# Patient Record
Sex: Female | Born: 1945 | Race: White | Hispanic: No | Smoking: Never smoker
Health system: Southern US, Community
[De-identification: ages and names within clinical notes are randomized; demographics above are authoritative.]

## PROBLEM LIST (undated history)

## (undated) DIAGNOSIS — G43909 Migraine, unspecified, not intractable, without status migrainosus: Secondary | ICD-10-CM

## (undated) DIAGNOSIS — N951 Menopausal and female climacteric states: Secondary | ICD-10-CM

## (undated) DIAGNOSIS — F0150 Vascular dementia without behavioral disturbance: Secondary | ICD-10-CM

## (undated) DIAGNOSIS — G8929 Other chronic pain: Secondary | ICD-10-CM

## (undated) DIAGNOSIS — R519 Headache, unspecified: Secondary | ICD-10-CM

## (undated) DIAGNOSIS — J329 Chronic sinusitis, unspecified: Secondary | ICD-10-CM

## (undated) HISTORY — DX: Migraine, unspecified, not intractable, without status migrainosus: G43.909

## (undated) HISTORY — DX: Menopausal and female climacteric states: N95.1

## (undated) HISTORY — DX: Chronic sinusitis, unspecified: J32.9

## (undated) HISTORY — DX: Other chronic pain: G89.29

## (undated) HISTORY — DX: Headache, unspecified: R51.9

## (undated) HISTORY — PX: DILATION AND CURETTAGE OF UTERUS: SHX78

---
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
## 1998-10-25 ENCOUNTER — Other Ambulatory Visit: Admission: RE | Admit: 1998-10-25 | Discharge: 1998-10-25 | Payer: Self-pay | Admitting: Obstetrics & Gynecology

## 2000-03-16 ENCOUNTER — Other Ambulatory Visit: Admission: RE | Admit: 2000-03-16 | Discharge: 2000-03-16 | Payer: Self-pay | Admitting: Obstetrics and Gynecology

## 2001-05-13 ENCOUNTER — Other Ambulatory Visit: Admission: RE | Admit: 2001-05-13 | Discharge: 2001-05-13 | Payer: Self-pay | Admitting: Obstetrics and Gynecology

## 2002-06-30 ENCOUNTER — Other Ambulatory Visit: Admission: RE | Admit: 2002-06-30 | Discharge: 2002-06-30 | Payer: Self-pay | Admitting: Obstetrics and Gynecology

## 2003-07-19 ENCOUNTER — Other Ambulatory Visit: Admission: RE | Admit: 2003-07-19 | Discharge: 2003-07-19 | Payer: Self-pay | Admitting: Obstetrics and Gynecology

## 2020-06-18 ENCOUNTER — Other Ambulatory Visit: Payer: Self-pay | Admitting: Family Medicine

## 2020-06-18 DIAGNOSIS — R229 Localized swelling, mass and lump, unspecified: Secondary | ICD-10-CM

## 2020-06-18 DIAGNOSIS — G43909 Migraine, unspecified, not intractable, without status migrainosus: Secondary | ICD-10-CM

## 2020-06-18 DIAGNOSIS — R519 Headache, unspecified: Secondary | ICD-10-CM

## 2020-06-21 ENCOUNTER — Ambulatory Visit
Admission: RE | Admit: 2020-06-21 | Discharge: 2020-06-21 | Disposition: A | Discharge status: Home / Self Care | Payer: Medicare Other | Source: Ambulatory Visit | Attending: Family Medicine | Admitting: Family Medicine

## 2020-06-21 DIAGNOSIS — R519 Headache, unspecified: Secondary | ICD-10-CM

## 2020-06-21 DIAGNOSIS — R229 Localized swelling, mass and lump, unspecified: Secondary | ICD-10-CM

## 2020-06-21 DIAGNOSIS — G43909 Migraine, unspecified, not intractable, without status migrainosus: Secondary | ICD-10-CM

## 2020-07-18 DIAGNOSIS — J321 Chronic frontal sinusitis: Secondary | ICD-10-CM | POA: Insufficient documentation

## 2020-07-18 DIAGNOSIS — J320 Chronic maxillary sinusitis: Secondary | ICD-10-CM | POA: Insufficient documentation

## 2020-07-22 ENCOUNTER — Emergency Department (HOSPITAL_COMMUNITY): Payer: Medicare Other

## 2020-07-22 ENCOUNTER — Encounter (HOSPITAL_COMMUNITY): Payer: Self-pay

## 2020-07-22 ENCOUNTER — Emergency Department (HOSPITAL_COMMUNITY)
Admission: EM | Admit: 2020-07-22 | Discharge: 2020-07-22 | Disposition: A | Discharge status: Home / Self Care | Payer: Medicare Other | Attending: Emergency Medicine | Admitting: Emergency Medicine

## 2020-07-22 DIAGNOSIS — Y9301 Activity, walking, marching and hiking: Secondary | ICD-10-CM | POA: Diagnosis not present

## 2020-07-22 DIAGNOSIS — R000 Tachycardia, unspecified: Secondary | ICD-10-CM | POA: Insufficient documentation

## 2020-07-22 DIAGNOSIS — R531 Weakness: Secondary | ICD-10-CM | POA: Diagnosis not present

## 2020-07-22 DIAGNOSIS — N3001 Acute cystitis with hematuria: Secondary | ICD-10-CM | POA: Diagnosis not present

## 2020-07-22 DIAGNOSIS — S42035A Nondisplaced fracture of lateral end of left clavicle, initial encounter for closed fracture: Secondary | ICD-10-CM

## 2020-07-22 DIAGNOSIS — S0990XA Unspecified injury of head, initial encounter: Secondary | ICD-10-CM | POA: Insufficient documentation

## 2020-07-22 DIAGNOSIS — Z20822 Contact with and (suspected) exposure to covid-19: Secondary | ICD-10-CM | POA: Insufficient documentation

## 2020-07-22 DIAGNOSIS — W19XXXA Unspecified fall, initial encounter: Secondary | ICD-10-CM

## 2020-07-22 DIAGNOSIS — B9689 Other specified bacterial agents as the cause of diseases classified elsewhere: Secondary | ICD-10-CM | POA: Diagnosis not present

## 2020-07-22 DIAGNOSIS — W108XXA Fall (on) (from) other stairs and steps, initial encounter: Secondary | ICD-10-CM | POA: Insufficient documentation

## 2020-07-22 LAB — URINALYSIS, ROUTINE W REFLEX MICROSCOPIC
Bilirubin Urine: NEGATIVE
Glucose, UA: NEGATIVE mg/dL
Ketones, ur: NEGATIVE mg/dL
Nitrite: POSITIVE — AB
Protein, ur: 30 mg/dL — AB
Specific Gravity, Urine: 1.021 (ref 1.005–1.030)
WBC, UA: >50 WBC/hpf — ABNORMAL HIGH (ref 0–5)
pH: 5 (ref 5.0–8.0)

## 2020-07-22 LAB — COMPREHENSIVE METABOLIC PANEL
ALT: 24 U/L (ref 0–44)
AST: 24 U/L (ref 15–41)
Albumin: 2.5 g/dL — ABNORMAL LOW (ref 3.5–5.0)
Alkaline Phosphatase: 141 U/L — ABNORMAL HIGH (ref 38–126)
Anion gap: 11 (ref 5–15)
BUN: 6 mg/dL — ABNORMAL LOW (ref 8–23)
CO2: 24 mmol/L (ref 22–32)
Calcium: 9.1 mg/dL (ref 8.9–10.3)
Chloride: 99 mmol/L (ref 98–111)
Creatinine, Ser: 0.71 mg/dL (ref 0.44–1.00)
GFR, Estimated: >60 mL/min (ref >60)
Glucose, Bld: 119 mg/dL — ABNORMAL HIGH (ref 70–99)
Potassium: 3.7 mmol/L (ref 3.5–5.1)
Sodium: 134 mmol/L — ABNORMAL LOW (ref 135–145)
Total Bilirubin: 0.8 mg/dL (ref 0.3–1.2)
Total Protein: 7.5 g/dL (ref 6.5–8.1)

## 2020-07-22 LAB — CBC WITH DIFFERENTIAL/PLATELET
Abs Immature Granulocytes: 0.07 10*3/uL (ref 0.00–0.07)
Basophils Absolute: 0.1 10*3/uL (ref 0.0–0.1)
Basophils Relative: 1 %
Eosinophils Absolute: 0.1 10*3/uL (ref 0.0–0.5)
Eosinophils Relative: 1 %
HCT: 33 % — ABNORMAL LOW (ref 36.0–46.0)
Hemoglobin: 10.2 g/dL — ABNORMAL LOW (ref 12.0–15.0)
Immature Granulocytes: 1 %
Lymphocytes Relative: 12 %
Lymphs Abs: 1.4 10*3/uL (ref 0.7–4.0)
MCH: 28.4 pg (ref 26.0–34.0)
MCHC: 30.9 g/dL (ref 30.0–36.0)
MCV: 91.9 fL (ref 80.0–100.0)
Monocytes Absolute: 0.9 10*3/uL (ref 0.1–1.0)
Monocytes Relative: 8 %
Neutro Abs: 8.5 10*3/uL — ABNORMAL HIGH (ref 1.7–7.7)
Neutrophils Relative %: 77 %
Platelets: 620 10*3/uL — ABNORMAL HIGH (ref 150–400)
RBC: 3.59 MIL/uL — ABNORMAL LOW (ref 3.87–5.11)
RDW: 13.8 % (ref 11.5–15.5)
WBC: 11 10*3/uL — ABNORMAL HIGH (ref 4.0–10.5)
nRBC: 0 % (ref 0.0–0.2)

## 2020-07-22 LAB — RESP PANEL BY RT PCR (RSV, FLU A&B, COVID)
Influenza A by PCR: NEGATIVE
Influenza B by PCR: NEGATIVE
Respiratory Syncytial Virus by PCR: NEGATIVE
SARS Coronavirus 2 by RT PCR: NEGATIVE

## 2020-07-22 LAB — LACTIC ACID, PLASMA: Lactic Acid, Venous: 1.5 mmol/L (ref 0.5–1.9)

## 2020-07-22 MED ORDER — CEPHALEXIN 500 MG PO CAPS: 500.0000 mg | ORAL_CAPSULE | Freq: Four times a day (QID) | ORAL | 0 refills | Status: AC

## 2020-07-22 MED ORDER — CEPHALEXIN 250 MG PO CAPS
500.0000 mg | ORAL_CAPSULE | Freq: Once | ORAL | Status: AC
  Administered 2020-07-22: 500 mg via ORAL
  Filled 2020-07-22: qty 2

## 2020-07-22 MED ORDER — ACETAMINOPHEN 500 MG PO TABS
1000.0000 mg | ORAL_TABLET | Freq: Once | ORAL | Status: AC
  Administered 2020-07-22: 1000 mg via ORAL
  Filled 2020-07-22: qty 2

## 2020-07-22 MED ORDER — SODIUM CHLORIDE 0.9 % IV SOLN: 1.0000 g | Freq: Once | INTRAVENOUS | Status: DC

## 2020-07-22 NOTE — ED Notes (Signed)
Patient placed [address] from lobby. Patient is ambulatory w/ steady gait and stated her sister is outside and she wants to leave. Informed patient needs some evaluation due to fall and re-directed to a chair. Nourishment provided.

## 2020-07-22 NOTE — ED Triage Notes (Signed)
Pt sent from UC for further evaluation of mechanical fall that occurred 3 days ago, pt fells down a few stairs onto her left side, denies hitting her head or LOC. Xray showed + displaced left clavicle fracture. Pt also reports bilateral leg weakness and +UTI at UC. Pt a.o, nad noted

## 2020-07-22 NOTE — ED Provider Notes (Signed)
[facility] EMERGENCY DEPARTMENT Provider Note   CSN: [id number] Arrival date & time: 07/22/20  1330     History Chief Complaint  Patient presents with  . Fall    Colleen Parker is a 74 y.o. female.  HPI   74 y/o female presents to the ED today for eval after a fall. Pt states she fell 1.5 weeks ago while walking up the stairs. She fell forward hitting her head on the stairs. She denies LOC, headaches, vomiting, dizziness/lightheadedness, numbness/weakness or other associated neuro complaints. She denies being on blood thinners. Denies any other injuries from the fall.   She has paperwork with her from urgent care where she was seen PTA. Paper indicates that patient was tachycardic, hypoxic at urgent care. She had a left clavicle fx with possible PNA in the LLL as well as a uti. She reportedly told urgent care she had a 1 week history of dysuria and leg weakness but she denies this to me. Denies fevers, chills, chest pain, shortness of breath, cough, abd pain, NVD, dysuria, frequency, urgency or hematuria.   Sister is at bedside and states that the patient has had urinary frequency for the last several nights and is also had fevers.  She states the patient has been having increased episodes of confusion over the course of the last few months.  History reviewed. No pertinent past medical history.  There are no problems to display for this patient.   History reviewed. No pertinent surgical history.   OB History   No obstetric history on file.     No family history on file.  Social History   Tobacco Use  . Smoking status: Not on file  Substance Use Topics  . Alcohol use: Not on file  . Drug use: Not on file    Home Medications Prior to Admission medications   Medication Sig Start Date End Date Taking? Authorizing Provider  cephALEXin (KEFLEX) 500 MG capsule Take 1 capsule (500 mg total) by mouth 4 (four) times daily for 14 days. 07/22/20 08/05/20   Quintavius Niebuhr S, PA-C    Allergies    Patient has no allergy information on record.  Review of Systems   Review of Systems  Constitutional: Positive for fever.  HENT: Negative for ear pain and sore throat.   Eyes: Negative for visual disturbance.  Respiratory: Negative for cough and shortness of breath.   Cardiovascular: Negative for chest pain.  Gastrointestinal: Negative for abdominal pain, constipation, diarrhea, nausea and vomiting.  Genitourinary: Positive for frequency. Negative for dysuria and hematuria.  Musculoskeletal: Negative for back pain.  Skin: Negative for color change, pallor and rash.  Neurological:       Head injury, no LOC  All other systems reviewed and are negative.   Physical Exam Updated Vital Signs BP 113/73   Pulse (!) 107   Temp (!) 100.5 F (38.1 C) (Oral)   Resp 16   SpO2 94%   Physical Exam Vitals and nursing note reviewed.  Constitutional:      General: She is not in acute distress.    Appearance: She is well-developed.  HENT:     Head: Normocephalic.     Comments: Old ecchymosis to the left side of the face/head Eyes:     Conjunctiva/sclera: Conjunctivae normal.  Cardiovascular:     Rate and Rhythm: Regular rhythm. Tachycardia present.     Heart sounds: Normal heart sounds. No murmur heard.   Pulmonary:     Effort:  Pulmonary effort is normal.     Breath sounds: Rales present.     Comments: Ecchymosis to the left upper chest Abdominal:     General: Bowel sounds are normal.     Palpations: Abdomen is soft.     Tenderness: There is no abdominal tenderness. There is no guarding or rebound.  Musculoskeletal:     Cervical back: Neck supple.  Skin:    General: Skin is warm and dry.  Neurological:     Mental Status: She is alert.     ED Results / Procedures / Treatments   Labs (all labs ordered are listed, but only abnormal results are displayed) Labs Reviewed  CBC WITH DIFFERENTIAL/PLATELET - Abnormal; Notable for the  following components:      Result Value   WBC 11.0 (*)    RBC 3.59 (*)    Hemoglobin 10.2 (*)    HCT 33.0 (*)    Platelets 620 (*)    Neutro Abs 8.5 (*)    All other components within normal limits  COMPREHENSIVE METABOLIC PANEL - Abnormal; Notable for the following components:   Sodium 134 (*)    Glucose, Bld 119 (*)    BUN 6 (*)    Albumin 2.5 (*)    Alkaline Phosphatase 141 (*)    All other components within normal limits  URINALYSIS, ROUTINE W REFLEX MICROSCOPIC - Abnormal; Notable for the following components:   Color, Urine AMBER (*)    APPearance CLOUDY (*)    Hgb urine dipstick SMALL (*)    Protein, ur 30 (*)    Nitrite POSITIVE (*)    Leukocytes,Ua MODERATE (*)    WBC, UA >50 (*)    Bacteria, UA MANY (*)    All other components within normal limits  RESP PANEL BY RT PCR (RSV, FLU A&B, COVID)  URINE CULTURE  LACTIC ACID, PLASMA    EKG None  Radiology DG Chest 2 View  Result Date: 07/22/2020 CLINICAL DATA:  Status post fall 1 and half weeks ago. Left clavicular pain and bruising. shortness of breath and dizziness with standing for a period of time. EXAM: LEFT CLAVICLE - 2+ VIEWS; CHEST - 2 VIEW COMPARISON:  None. FINDINGS: Left clavicle: Poorly visualized acute, possibly comminuted, fracture of the distal left clavicle. No acromioclavicular or coracoclavicular joint space widening. Associated subcutaneus soft tissue edema. Chest x-ray: The heart size and mediastinal contours are within normal limits. Thoracic aorta atherosclerotic plaque. No focal consolidation. No pulmonary edema. No pleural effusion. No pneumothorax. Multilevel degenerative changes of the spine. IMPRESSION: Acute, possibly comminuted, fracture of the distal left clavicle. No acute cardiopulmonary abnormality. Electronically Signed   By: Tish Frederickson M.D.   On: 07/22/2020 16:42   DG Clavicle Left  Result Date: 07/22/2020 CLINICAL DATA:  Status post fall 1 and half weeks ago. Left clavicular pain  and bruising. shortness of breath and dizziness with standing for a period of time. EXAM: LEFT CLAVICLE - 2+ VIEWS; CHEST - 2 VIEW COMPARISON:  None. FINDINGS: Left clavicle: Poorly visualized acute, possibly comminuted, fracture of the distal left clavicle. No acromioclavicular or coracoclavicular joint space widening. Associated subcutaneus soft tissue edema. Chest x-ray: The heart size and mediastinal contours are within normal limits. Thoracic aorta atherosclerotic plaque. No focal consolidation. No pulmonary edema. No pleural effusion. No pneumothorax. Multilevel degenerative changes of the spine. IMPRESSION: Acute, possibly comminuted, fracture of the distal left clavicle. No acute cardiopulmonary abnormality. Electronically Signed   By: Normajean Glasgow.D.  On: 07/22/2020 16:42   CT Head Wo Contrast  Result Date: 07/22/2020 CLINICAL DATA:  Larey SeatFell, head trauma EXAM: CT HEAD WITHOUT CONTRAST TECHNIQUE: Contiguous axial images were obtained from the base of the skull through the vertex without intravenous contrast. COMPARISON:  06/21/2020 FINDINGS: Brain: No acute infarct or hemorrhage. Chronic hypodensities in the periventricular white matter consistent with chronic small vessel ischemic change. Lateral ventricles and remaining midline structures are unremarkable. No acute extra-axial fluid collections. No mass effect. Vascular: No hyperdense vessel or unexpected calcification. Skull: Normal. Negative for fracture or focal lesion. Sinuses/Orbits: Chronic appearing sinus disease involving the left frontal and left maxillary sinus, stable. Other: None. IMPRESSION: 1. Stable exam, no acute intracranial process. 2. 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 Electronically Signed   By: Sharlet SalinaMichael  Brown M.D.   On: 07/22/2020 17:55   CT Maxillofacial Wo Contrast  Result Date: 07/22/2020 CLINICAL DATA:  Recent fall with facial pain, initial encounter EXAM: CT MAXILLOFACIAL WITHOUT CONTRAST  TECHNIQUE: Multidetector CT imaging of the maxillofacial structures was performed. Multiplanar CT image reconstructions were also generated. COMPARISON:  06/21/2020 FINDINGS: Osseous: No acute fracture is identified. Mild degenerative changes of the temporomandibular joints are seen. Orbits: Orbits and their contents appear within normal limits. Sinuses: Paranasal sinuses are well aerated. Mucosal thickening is noted within the left frontal, ethmoid sinus is and left maxillary sinus. This appearance has improved in the interval from the prior exam. Soft tissues: Surrounding soft tissue structures show no acute focal hematoma. No significant soft tissue swelling is noted. Limited intracranial: No significant or unexpected finding. IMPRESSION: No acute fracture is noted. Persistent but improved mucosal thickening within the paranasal sinuses as described. Electronically Signed   By: Alcide CleverMark  Lukens M.D.   On: 07/22/2020 17:56    Procedures Procedures (including critical care time)  Medications Ordered in ED Medications  cephALEXin (KEFLEX) capsule 500 mg (has no administration in time range)  acetaminophen (TYLENOL) tablet 1,000 mg (1,000 mg Oral Given 07/22/20 1800)    ED Course  I have reviewed the triage vital signs and the nursing notes.  Pertinent labs & imaging results that were available during my care of the patient were reviewed by me and considered in my medical decision making (see chart for details).    MDM Rules/Calculators/A&P                          74 year old female presenting the emergency department today for evaluation after a fall.  Has also been having fevers and urinary frequency at home per sister.  On arrival patient found to be febrile, tachycardic.  She is not hypoxic on my evaluation and does not have any hypotension.  Reviewed/interpreted labs CBC with mild leukocytosis, mild anemia CMP is nonacute Lactic acid negative Covid panel negative UA with leuks,  nitrites, consistent with UTI. Urine culture sent. Keflex given in ED and rx sent for home.  Reviewed/interpreted imaging CXR - Acute, possibly comminuted, fracture of the distal left clavicle. No acute cardiopulmonary abnormality. XRAY left clavicle - Acute, possibly comminuted, fracture of the distal left clavicle. No acute cardiopulmonary abnormality. CT head/maxillofacial - 1. Stable exam, no acute intracranial process. 2. 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 No facial fracture.  Patient with fall a week ago. Imaging is negative for acute traumatic injuries. Also with urinary frequency and fevers at home. Work-up consistent with UTI. No evidence of sepsis. Antibiotics given in ED and Rx given for  home. Patient vies follow-up with PCP and return to the ED for new or worsening symptoms. Patient sister bedside voiced understanding of plan reasons to return. All questions answered. Patient stable for discharge.  Final Clinical Impression(s) / ED Diagnoses Final diagnoses:  Fall, initial encounter  Injury of head, initial encounter  Acute cystitis with hematuria    Rx / DC Orders ED Discharge Orders         Ordered    cephALEXin (KEFLEX) 500 MG capsule  4 times daily        07/22/20 1824           Karrie Meres, PA-C 07/22/20 1824    Lorre Nick, MD 07/22/20 [phone]

## 2020-07-22 NOTE — ED Notes (Signed)
Patient transported to CT 

## 2020-07-22 NOTE — Discharge Instructions (Addendum)
You were given a prescription for antibiotics. Please take the antibiotic prescription fully.   A culture was sent of your urine today to determine if there is any bacterial growth. If the results of the culture are positive and you require an antibiotic or a change of your prescribed antibiotic you will be contacted by the hospital. If the results are negative you will not be contacted.  You were also given information to follow-up with an orthopedic doctor, Dr. Charlann Boxer.  This is in regards to your clavicle fracture.  Please call the office to schedule an appointment for follow-up.  Please follow up with your primary care provider within 5-7 days for re-evaluation of your symptoms. If you do not have a primary care provider, information for a healthcare clinic has been provided for you to make arrangements for follow up care. Please return to the emergency department for any new or worsening symptoms.

## 2020-07-24 LAB — URINE CULTURE: Culture: >=100000 — AB

## 2020-07-25 ENCOUNTER — Telehealth: Payer: Self-pay | Admitting: *Deleted

## 2020-07-25 NOTE — Telephone Encounter (Signed)
Post ED Visit - Positive Culture Follow-up ° °Culture report reviewed by antimicrobial stewardship pharmacist: °[facility] °[] Nathan Batchelder, Pharm.D. °[] Jeremy Frens, Pharm.D., BCPS AQ-ID °[] Mike Maccia, Pharm.D., BCPS °[] Elizabeth Martin, Pharm.D., BCPS °[] Minh Pham, Pharm.D., BCPS, AAHIVP °[] Michelle Turner, Pharm.D., BCPS, AAHIVP °[] Rachel Rumbarger, PharmD, BCPS °[] Thuy Dang, PharmD, BCPS °[] Alison Masters, PharmD, BCPS °[] Erin Deja, PharmD °[] Catherine Pierce, PharmD, BCPS °[] Benjamin Mancheril, PharmD ° °[facility] °[] Michelle Bell, PharmD °[] Jigna Gadhia, PharmD °[] Nikola Glogovac, PharmD °[] Terri Green, Rph °[] Rachel (Ellen) Jackson, PharmD °[] Justin Legge, PharmD °[] Michelle Lilliston, PharmD °[] Anh Pham, PharmD °[] Leann Poindexter, PharmD °[] Christine Shade, PharmD °[] Mary Swayne, PharmD °[] Erin Williamson, PharmD °[] Drew Wofford, PharmD ° ° °Positive urine culture °Treated with Cephalexin, organism sensitive to the same and no further patient follow-up is required at this time. Madeline Mitchell, PharmD °[facility] °07/25/2020, 10:24 AM ° ° °

## 2020-08-21 ENCOUNTER — Other Ambulatory Visit: Payer: Self-pay | Admitting: *Deleted

## 2020-08-21 ENCOUNTER — Encounter: Payer: Self-pay | Admitting: *Deleted

## 2020-08-23 ENCOUNTER — Ambulatory Visit (INDEPENDENT_AMBULATORY_CARE_PROVIDER_SITE_OTHER): Payer: Medicare Other | Admitting: Diagnostic Neuroimaging

## 2020-08-23 ENCOUNTER — Encounter: Payer: Self-pay | Admitting: Diagnostic Neuroimaging

## 2020-08-23 VITALS — BP 156/84 | HR 106 | Ht 65.0 in | Wt 120.2 lb

## 2020-08-23 DIAGNOSIS — G44209 Tension-type headache, unspecified, not intractable: Secondary | ICD-10-CM

## 2020-08-23 DIAGNOSIS — R413 Other amnesia: Secondary | ICD-10-CM | POA: Diagnosis not present

## 2020-08-23 NOTE — Patient Instructions (Signed)
TENSION HEADACHES (vs sinus headaches) - resolved; monitor for now  MEMORY LOSS - safety / supervision issues reviewed - daily physical activity / exercise (at least 15-30 minutes) - eat more plants / vegetables - increase social activities, brain stimulation, games, puzzles, hobbies, crafts, arts, music - aim for at least 7-8 hours sleep per night (or more) - avoid smoking and alcohol - caution with driving, finances, medications

## 2020-08-23 NOTE — Progress Notes (Signed)
[facility]  PATIENT: Colleen Parker DOB: 09-05-1946  REFERRING CLINICIAN: Elisabeth Most, FNP HISTORY FROM: patient  REASON FOR VISIT: new consult    HISTORICAL  CHIEF COMPLAINT:  Chief Complaint  Patient presents with  . New Patient (Initial Visit)    RM 6 with younger sister, Colleen Parker. Paper referral from Colleen Most, FNP for ongoing headaches. Denies any double/blurry vision. Had sinus infection starting end of July. Found correct antibiotic first of October that helped. Then got UTI/bad fever which led to a fall. She broke clavicle 07/18/20. Has pain on back of head at base of skull. that is ongoing.     HISTORY OF PRESENT ILLNESS:   74 year old female here for evaluation of headaches.  July 2021 patient had onset of headaches.  She describes right posterior auricular headache, with no associated symptoms.  No nausea or vomiting.  No sensitive light or sound.  Patient ultimately diagnosed with sinus infections treated with antibiotics.  Headache significant proved after that.  Patient had urinary tract infection and fever, weakness, leading to a fall and broken clavicle in October 2021.  Patient is recovering from those injuries.  Patient had some confusion at that time.  Patient has had some mild progressive memory loss over the past 1 to 2 years.  Patient sister has been living with her for the last 9 years.  In the past 1 year patient sister has had to help with more ADLs such as driving, shopping, appointments and medication management.  Strong family history of dementia in patient's mother and aunt.   REVIEW OF SYSTEMS: Full 14 system review of systems performed and negative with exception of: As per HPI.  ALLERGIES: Allergies  Allergen Reactions  . Penicillins Shortness Of Breath  . Sulfamethoxazole Hives    HOME MEDICATIONS: Outpatient Medications Prior to Visit  Medication Sig Dispense Refill  . acetaminophen (TYLENOL) 500 MG tablet Take  1,000 mg by mouth daily.    . Cyanocobalamin (B-12) 1000 MCG TABS TAKE 3 TABLETS BY MOUTH DAILY    . ibuprofen (ADVIL) 200 MG tablet Take 200 mg by mouth in the morning and at bedtime.    Marland Kitchen loratadine (CLARITIN) 10 MG tablet Take 10 mg by mouth daily.    . Vitamin D, Ergocalciferol, (DRISDOL) 1.25 MG (50000 UNIT) CAPS capsule Take 50,000 Units by mouth once a week.     No facility-administered medications prior to visit.    PAST MEDICAL HISTORY: Past Medical History:  Diagnosis Date  . Chronic headaches   . Menopausal state   . Migraines   . Recurrent sinus infections     PAST SURGICAL HISTORY: Past Surgical History:  Procedure Laterality Date  . DILATION AND CURETTAGE OF UTERUS     abnl cells  . TONSILLECTOMY  1956    FAMILY HISTORY: Family History  Problem Relation Age of Onset  . Alzheimer's disease Mother   . Heart attack Mother   . Heart attack Father   . Heart disease Brother     SOCIAL HISTORY: Social History   Socioeconomic History  . Marital status: Widowed    Spouse name: Not on file  . Number of children: 0  . Years of education: BA  . Highest education level: Bachelor's degree (e.g., BA, AB, BS)  Occupational History  . Occupation: Retired     Comment: Runner, broadcasting/film/video retired  Tobacco Use  . Smoking status: Never Smoker  . Smokeless tobacco: Never Used  Substance and Sexual Activity  .  Alcohol use: Yes    Comment: ocass  . Drug use: Never  . Sexual activity: Not on file  Other Topics Concern  . Not on file  Social History Narrative   Lives at home and younger sister lives with her    Caffeine use: coffee  And tea sometimes   Right handed   Social Determinants of Health   Financial Resource Strain:   . Difficulty of Paying Living Expenses: Not on file  Food Insecurity:   . Worried About Programme researcher, broadcasting/film/video in the Last Year: Not on file  . Ran Out of Food in the Last Year: Not on file  Transportation Needs:   . Lack of Transportation (Medical):  Not on file  . Lack of Transportation (Non-Medical): Not on file  Physical Activity:   . Days of Exercise per Week: Not on file  . Minutes of Exercise per Session: Not on file  Stress:   . Feeling of Stress : Not on file  Social Connections:   . Frequency of Communication with Friends and Family: Not on file  . Frequency of Social Gatherings with Friends and Family: Not on file  . Attends Religious Services: Not on file  . Active Member of Clubs or Organizations: Not on file  . Attends Banker Meetings: Not on file  . Marital Status: Not on file  Intimate Partner Violence:   . Fear of Current or Ex-Partner: Not on file  . Emotionally Abused: Not on file  . Physically Abused: Not on file  . Sexually Abused: Not on file     PHYSICAL EXAM  GENERAL EXAM/CONSTITUTIONAL: Vitals:  Vitals:   08/23/20 0802  BP: (!) 156/84  Pulse: (!) 106  Weight: 120 lb 3.2 oz (54.5 kg)  Height: 5\' 5"  (1.651 m)     Body mass index is 20 kg/m. Wt Readings from Last 3 Encounters:  08/23/20 120 lb 3.2 oz (54.5 kg)     Patient is in no distress; well developed, nourished and groomed; neck is supple  CARDIOVASCULAR:  Examination of carotid arteries is normal; no carotid bruits  Regular rate and rhythm, no murmurs  Examination of peripheral vascular system by observation and palpation is normal  EYES:  Ophthalmoscopic exam of optic discs and posterior segments is normal; no papilledema or hemorrhages  No exam data present  MUSCULOSKELETAL:  Gait, strength, tone, movements noted in Neurologic exam below  NEUROLOGIC: MENTAL STATUS:  No flowsheet data found.  awake, alert, oriented to person, place and time  recent and remote memory intact  normal attention and concentration  language fluent, comprehension intact, naming intact; SOME DIFFICULTY WITH COMPREHENSION  fund of knowledge appropriate  CRANIAL NERVE:   2nd - no papilledema on fundoscopic exam  2nd,  3rd, 4th, 6th - pupils equal and reactive to light, visual fields full to confrontation, extraocular muscles intact, no nystagmus  5th - facial sensation symmetric  7th - facial strength symmetric  8th - hearing intact  9th - palate elevates symmetrically, uvula midline  11th - shoulder shrug symmetric  12th - tongue protrusion midline  MOTOR:   normal bulk and tone, full strength in the BUE, BLE  MILD MOTOR APRAXIA  SENSORY:   normal and symmetric to light touch, temperature, vibration  COORDINATION:   finger-nose-finger, fine finger movements normal  REFLEXES:   deep tendon reflexes present and symmetric  GAIT/STATION:   narrow based gait     DIAGNOSTIC DATA (LABS, IMAGING, TESTING) - I  reviewed patient records, labs, notes, testing and imaging myself where available.  Lab Results  Component Value Date   WBC 11.0 (H) 07/22/2020   HGB 10.2 (L) 07/22/2020   HCT 33.0 (L) 07/22/2020   MCV 91.9 07/22/2020   PLT 620 (H) 07/22/2020      Component Value Date/Time   NA 134 (L) 07/22/2020 1644   K 3.7 07/22/2020 1644   CL 99 07/22/2020 1644   CO2 24 07/22/2020 1644   GLUCOSE 119 (H) 07/22/2020 1644   BUN 6 (L) 07/22/2020 1644   CREATININE 0.71 07/22/2020 1644   CALCIUM 9.1 07/22/2020 1644   PROT 7.5 07/22/2020 1644   ALBUMIN 2.5 (L) 07/22/2020 1644   AST 24 07/22/2020 1644   ALT 24 07/22/2020 1644   ALKPHOS 141 (H) 07/22/2020 1644   BILITOT 0.8 07/22/2020 1644   GFRNONAA >60 07/22/2020 1644   No results found for: CHOL, HDL, LDLCALC, LDLDIRECT, TRIG, CHOLHDL No results found for: JJOA4Z No results found for: VITAMINB12 No results found for: TSH   06/21/20 CT head 1. No evidence of acute intracranial abnormality. 2. Left frontal and left maxillary sinus disease, which can be seen with sinusitis in the correct clinical setting. 3. 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   07/22/20 CT head [I reviewed images myself and  agree with interpretation. Mild atrophy and ventriculomegaly. -VRP]  1. Stable exam, no acute intracranial process. 2. 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    ASSESSMENT AND PLAN  74 y.o. year old female here with:  Dx:  1. Tension headache   2. 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    Suanne Marker, MD 08/23/2020, 8:59 AM Certified in Neurology, Neurophysiology and Neuroimaging  [facility] [address] [place], [place] [address] [phone]

## 2020-08-28 ENCOUNTER — Ambulatory Visit (INDEPENDENT_AMBULATORY_CARE_PROVIDER_SITE_OTHER): Payer: Medicare Other | Admitting: Podiatry

## 2020-08-28 ENCOUNTER — Other Ambulatory Visit: Payer: Self-pay

## 2020-08-28 DIAGNOSIS — S90211A Contusion of right great toe with damage to nail, initial encounter: Secondary | ICD-10-CM | POA: Diagnosis not present

## 2020-08-29 ENCOUNTER — Encounter: Payer: Self-pay | Admitting: Podiatry

## 2020-08-29 NOTE — Progress Notes (Signed)
Subjective:  Patient ID: Colleen Parker, female    DOB: 1945-10-22,  MRN: [id number]  Chief Complaint  Patient presents with  . Nail Problem    nail trim. Right hallux nail is bloody     74 y.o. female presents with the above complaint.  Patient presents with complaint of right hallux total contusion a while ago.  Patient states now is becoming more bloody she does not recall stubbing it or hitting it.  She wants to have the nail removed as it is mostly hanging on by a thread.  She denies any other acute complaints.  She has not seen anyone else prior to seeing me.  There is no clinical signs of infection.  She has not taken antibiotics.  She has been wearing her regular shoes.   Review of Systems: Negative except as noted in the HPI. Denies N/V/F/Ch.  Past Medical History:  Diagnosis Date  . Chronic headaches   . Menopausal state   . Migraines   . Recurrent sinus infections     Current Outpatient Medications:  .  acetaminophen (TYLENOL) 500 MG tablet, Take 1,000 mg by mouth daily., Disp: , Rfl:  .  Cyanocobalamin (B-12) 1000 MCG TABS, TAKE 3 TABLETS BY MOUTH DAILY, Disp: , Rfl:  .  ibuprofen (ADVIL) 200 MG tablet, Take 200 mg by mouth in the morning and at bedtime., Disp: , Rfl:  .  loratadine (CLARITIN) 10 MG tablet, Take 10 mg by mouth daily., Disp: , Rfl:  .  Vitamin D, Ergocalciferol, (DRISDOL) 1.25 MG (50000 UNIT) CAPS capsule, Take 50,000 Units by mouth once a week., Disp: , Rfl:   Social History   Tobacco Use  Smoking Status Never Smoker  Smokeless Tobacco Never Used    Allergies  Allergen Reactions  . Penicillins Shortness Of Breath  . Sulfamethoxazole Hives   Objective:  There were no vitals filed for this visit. There is no height or weight on file to calculate BMI. Constitutional Well developed. Well nourished.  Vascular Dorsalis pedis pulses palpable bilaterally. Posterior tibial pulses palpable bilaterally. Capillary refill normal to all digits.  No  cyanosis or clubbing noted. Pedal hair growth normal.  Neurologic Normal speech. Oriented to person, place, and time. Epicritic sensation to light touch grossly present bilaterally.  Dermatologic Pain on palpation of the entire/total nail on 1st digit of the right No other open wounds. No skin lesions.  Orthopedic: Normal joint ROM without pain or crepitus bilaterally. No visible deformities. No bony tenderness.   Radiographs: None Assessment:   1. Contusion of right great toe with damage to nail, initial encounter    Plan:  Patient was evaluated and treated and all questions answered.  Nail contusion/dystrophy hallux, right -Patient elects to proceed with minor surgery to remove entire toenail today. Consent reviewed and signed by patient. -Entire/total nail excised. See procedure note. -Educated on post-procedure care including soaking. Written instructions provided and reviewed. -Patient to follow up in 2 weeks for nail check.  Procedure: Excision of entire/total nail  Location: Right 1st toe digit Anesthesia: Lidocaine 1% plain; 1.5 mL and Marcaine 0.5% plain; 1.5 mL, digital block. Skin Prep: Betadine. Dressing: Silvadene; telfa; dry, sterile, compression dressing. Technique: Following skin prep, the toe was exsanguinated and a tourniquet was secured at the base of the toe. The affected nail border was freed and excised. The tourniquet was then removed and sterile dressing applied. Disposition: Patient tolerated procedure well. Patient to return in 2 weeks for follow-up.   No follow-ups  on file.

## 2020-11-27 ENCOUNTER — Other Ambulatory Visit: Payer: Self-pay

## 2020-11-27 ENCOUNTER — Ambulatory Visit (INDEPENDENT_AMBULATORY_CARE_PROVIDER_SITE_OTHER): Payer: Medicare Other | Admitting: Podiatry

## 2020-11-27 DIAGNOSIS — M79674 Pain in right toe(s): Secondary | ICD-10-CM | POA: Diagnosis not present

## 2020-11-27 DIAGNOSIS — B351 Tinea unguium: Secondary | ICD-10-CM

## 2020-11-27 DIAGNOSIS — M79675 Pain in left toe(s): Secondary | ICD-10-CM

## 2020-11-28 ENCOUNTER — Encounter: Payer: Self-pay | Admitting: Podiatry

## 2020-11-28 NOTE — Progress Notes (Signed)
  Subjective:  Patient ID: Colleen Parker, female    DOB: 07-13-1946,  MRN: [id number]  Chief Complaint  Patient presents with  . Nail Problem    Nail trim    75 y.o. female returns for the above complaint.  Patient presents with thickened elongated dystrophic toenails x10.  Patient is not a diabetic.  She would like to have the nails debrided down.  She is not able to do it herself.  They are painful to touch.  She denies any other acute complaints.  Objective:  There were no vitals filed for this visit. Podiatric Exam: Vascular: dorsalis pedis and posterior tibial pulses are palpable bilateral. Capillary return is immediate. Temperature gradient is WNL. Skin turgor WNL  Sensorium: Normal Semmes Weinstein monofilament test. Normal tactile sensation bilaterally. Nail Exam: Pt has thick disfigured discolored nails with subungual debris noted bilateral entire nail hallux through fifth toenails.  Pain on palpation to the nails. Ulcer Exam: There is no evidence of ulcer or pre-ulcerative changes or infection. Orthopedic Exam: Muscle tone and strength are WNL. No limitations in general ROM. No crepitus or effusions noted. HAV  B/L.  Hammer toes 2-5  B/L. Skin: No Porokeratosis. No infection or ulcers    Assessment & Plan:   1. Pain due to onychomycosis of toenails of both feet     Patient was evaluated and treated and all questions answered.  Onychomycosis with pain  -Nails palliatively debrided as below. -Educated on self-care  Procedure: Nail Debridement Rationale: pain  Type of Debridement: manual, sharp debridement. Instrumentation: Nail nipper, rotary burr. Number of Nails: 10  Procedures and Treatment: Consent by patient was obtained for treatment procedures. The patient understood the discussion of treatment and procedures well. All questions were answered thoroughly reviewed. Debridement of mycotic and hypertrophic toenails, 1 through 5 bilateral and clearing of subungual  debris. No ulceration, no infection noted.  Return Visit-Office Procedure: Patient instructed to return to the office for a follow up visit 3 months for continued evaluation and treatment.  Nicholes Rough, DPM    No follow-ups on file.

## 2021-02-28 ENCOUNTER — Ambulatory Visit (INDEPENDENT_AMBULATORY_CARE_PROVIDER_SITE_OTHER): Payer: Medicare Other | Admitting: Podiatry

## 2021-02-28 ENCOUNTER — Other Ambulatory Visit: Payer: Self-pay

## 2021-02-28 ENCOUNTER — Encounter: Payer: Self-pay | Admitting: Podiatry

## 2021-02-28 DIAGNOSIS — B351 Tinea unguium: Secondary | ICD-10-CM

## 2021-02-28 DIAGNOSIS — M79675 Pain in left toe(s): Secondary | ICD-10-CM | POA: Diagnosis not present

## 2021-02-28 DIAGNOSIS — M79674 Pain in right toe(s): Secondary | ICD-10-CM

## 2021-02-28 NOTE — Progress Notes (Signed)
This patient returns to the office for evaluation and treatment of long thick painful nails .  This patient is unable to trim her own nails since the patient cannot reach her feet.  Patient says the nails are painful walking and wearing his shoes.  He returns for preventive foot care services.  General Appearance  Alert, conversant and in no acute stress.  Vascular  Dorsalis pedis and posterior tibial  pulses are palpable  bilaterally.  Capillary return is within normal limits  bilaterally. Temperature is within normal limits  bilaterally.  Neurologic  Senn-Weinstein monofilament wire test within normal limits  bilaterally. Muscle power within normal limits bilaterally.  Nails Thick disfigured discolored nails with subungual debris  from hallux to fifth toes bilaterally. No evidence of bacterial infection or drainage bilaterally.  Orthopedic  No limitations of motion  feet .  No crepitus or effusions noted.  HAV  B/L.  Hammet toes  B/L.  Skin  normotropic skin with no porokeratosis noted bilaterally.  No signs of infections or ulcers noted.     Onychomycosis  Pain in toes right foot  Pain in toes left foot  Debridement  of nails  1-5  B/L with a nail nipper.  Nails were then filed using a dremel tool with no incidents.    RTC 3 months    Helane Gunther DPM

## 2021-03-16 ENCOUNTER — Emergency Department (HOSPITAL_COMMUNITY)
Admission: EM | Admit: 2021-03-16 | Discharge: 2021-03-16 | Disposition: A | Discharge status: Home / Self Care | Payer: Medicare Other | Attending: Emergency Medicine | Admitting: Emergency Medicine

## 2021-03-16 ENCOUNTER — Emergency Department (HOSPITAL_COMMUNITY): Payer: Medicare Other

## 2021-03-16 ENCOUNTER — Other Ambulatory Visit: Payer: Self-pay

## 2021-03-16 ENCOUNTER — Encounter (HOSPITAL_COMMUNITY): Payer: Self-pay | Admitting: Emergency Medicine

## 2021-03-16 DIAGNOSIS — D72829 Elevated white blood cell count, unspecified: Secondary | ICD-10-CM | POA: Diagnosis not present

## 2021-03-16 DIAGNOSIS — Z20822 Contact with and (suspected) exposure to covid-19: Secondary | ICD-10-CM | POA: Insufficient documentation

## 2021-03-16 DIAGNOSIS — J40 Bronchitis, not specified as acute or chronic: Secondary | ICD-10-CM | POA: Insufficient documentation

## 2021-03-16 DIAGNOSIS — R0602 Shortness of breath: Secondary | ICD-10-CM | POA: Diagnosis present

## 2021-03-16 DIAGNOSIS — I712 Thoracic aortic aneurysm, without rupture: Secondary | ICD-10-CM | POA: Insufficient documentation

## 2021-03-16 DIAGNOSIS — I7121 Aneurysm of the ascending aorta, without rupture: Secondary | ICD-10-CM

## 2021-03-16 LAB — CBC WITH DIFFERENTIAL/PLATELET
Abs Immature Granulocytes: 0.04 10*3/uL (ref 0.00–0.07)
Basophils Absolute: 0.1 10*3/uL (ref 0.0–0.1)
Basophils Relative: 1 %
Eosinophils Absolute: 0.5 10*3/uL (ref 0.0–0.5)
Eosinophils Relative: 5 %
HCT: 41.2 % (ref 36.0–46.0)
Hemoglobin: 13 g/dL (ref 12.0–15.0)
Immature Granulocytes: 0 %
Lymphocytes Relative: 13 %
Lymphs Abs: 1.5 10*3/uL (ref 0.7–4.0)
MCH: 29.8 pg (ref 26.0–34.0)
MCHC: 31.6 g/dL (ref 30.0–36.0)
MCV: 94.5 fL (ref 80.0–100.0)
Monocytes Absolute: 0.9 10*3/uL (ref 0.1–1.0)
Monocytes Relative: 8 %
Neutro Abs: 8.8 10*3/uL — ABNORMAL HIGH (ref 1.7–7.7)
Neutrophils Relative %: 73 %
Platelets: 413 10*3/uL — ABNORMAL HIGH (ref 150–400)
RBC: 4.36 MIL/uL (ref 3.87–5.11)
RDW: 14.9 % (ref 11.5–15.5)
WBC: 11.9 10*3/uL — ABNORMAL HIGH (ref 4.0–10.5)
nRBC: 0 % (ref 0.0–0.2)

## 2021-03-16 LAB — BASIC METABOLIC PANEL
Anion gap: 15 (ref 5–15)
BUN: 11 mg/dL (ref 8–23)
CO2: 16 mmol/L — ABNORMAL LOW (ref 22–32)
Calcium: 9.3 mg/dL (ref 8.9–10.3)
Chloride: 108 mmol/L (ref 98–111)
Creatinine, Ser: 0.84 mg/dL (ref 0.44–1.00)
GFR, Estimated: >60 mL/min (ref >60)
Glucose, Bld: 85 mg/dL (ref 70–99)
Potassium: 4.5 mmol/L (ref 3.5–5.1)
Sodium: 139 mmol/L (ref 135–145)

## 2021-03-16 LAB — D-DIMER, QUANTITATIVE: D-Dimer, Quant: 0.76 ug/mL-FEU — ABNORMAL HIGH (ref 0.00–0.50)

## 2021-03-16 LAB — TROPONIN I (HIGH SENSITIVITY): Troponin I (High Sensitivity): 6 ng/L (ref <18)

## 2021-03-16 LAB — BRAIN NATRIURETIC PEPTIDE: B Natriuretic Peptide: 79.5 pg/mL (ref 0.0–100.0)

## 2021-03-16 MED ORDER — IOHEXOL 350 MG/ML SOLN
80.0000 mL | Freq: Once | INTRAVENOUS | Status: AC | PRN
  Administered 2021-03-16: 80 mL via INTRAVENOUS

## 2021-03-16 MED ORDER — PREDNISONE 20 MG PO TABS
60.0000 mg | ORAL_TABLET | Freq: Once | ORAL | Status: AC
  Administered 2021-03-16: 60 mg via ORAL
  Filled 2021-03-16: qty 3

## 2021-03-16 MED ORDER — ALBUTEROL SULFATE HFA 108 (90 BASE) MCG/ACT IN AERS
2.0000 | INHALATION_SPRAY | Freq: Once | RESPIRATORY_TRACT | Status: AC
Start: 2021-03-16 — End: 2021-03-16
  Administered 2021-03-16: 2 via RESPIRATORY_TRACT
  Filled 2021-03-16: qty 6.7

## 2021-03-16 MED ORDER — DOXYCYCLINE HYCLATE 100 MG PO TABS
100.0000 mg | ORAL_TABLET | Freq: Once | ORAL | Status: AC
  Administered 2021-03-16: 100 mg via ORAL
  Filled 2021-03-16: qty 1

## 2021-03-16 MED ORDER — DOXYCYCLINE HYCLATE 100 MG PO CAPS: 100.0000 mg | ORAL_CAPSULE | Freq: Two times a day (BID) | ORAL | 0 refills | Status: DC

## 2021-03-16 MED ORDER — PREDNISONE 20 MG PO TABS: 20.0000 mg | ORAL_TABLET | Freq: Every day | ORAL | 0 refills | Status: AC

## 2021-03-16 NOTE — ED Triage Notes (Signed)
Pt c/o increased "spells" of shortness of breath. States sometimes it feels like she is unable to take a deep breath. Worse with exertion. Denies chest pain, fever, cough, new swelling.

## 2021-03-16 NOTE — Discharge Instructions (Addendum)
Starting tomorrow take the prednisone daily until gone.  Take the antibiotic doxycycline every 12 hours until gone. You can use 1 to 2 puffs of the albuterol inhaler every 4-6 hours as needed for shortness of breath or wheezing. If shortness of breath is not improve with the inhaler or if you develop chest pain, please return back to the emergency department. As discussed your CT scan shows incidental findings of a sending thoracic aortic aneurysm measuring as large as 5.6 cm in diameter.  This needs semiannual monitoring with either CT angiography or MRA.  You have been provided with referral to cardiothoracic surgeons, it is important you follow-up.  It is also important you establish primary care.

## 2021-03-16 NOTE — ED Notes (Signed)
Patient transported to CT 

## 2021-03-16 NOTE — ED Provider Notes (Signed)
[facility] EMERGENCY DEPARTMENT Provider Note   CSN: [id number] Arrival date & time: 03/16/21  1636     History Chief Complaint  Patient presents with   Shortness of Breath    Colleen Parker is a 75 y.o. female past medical history of asthma, presenting to the emergency department for evaluation of intermittent shortness of breath.  She states this has been going on since the spring though symptoms have worsened lately.  She felt short of breath today especially with exertion.  She also has some wheezing with it.  Reports childhood history of asthma, this feels the same though has not had any difficulty with asthma in many years.  Has had some seasonal allergies.  She has no chest pain.  No lower extremity edema or pain.  No history of DVT or PE, no exogenous estrogen use, no recent immobilization, trauma, surgery.  No history of cancer.  Does not see a medical doctor, has not seen one in many years.  No fever.  Some cough.  The history is provided by the patient.      Past Medical History:  Diagnosis Date   Chronic headaches    Menopausal state    Migraines    Recurrent sinus infections     Patient Active Problem List   Diagnosis Date Noted   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/2020    Past Surgical History:  Procedure Laterality Date   DILATION AND CURETTAGE OF UTERUS     abnl cells   TONSILLECTOMY  1956     OB History   No obstetric history on file.     Family History  Problem Relation Age of Onset   Alzheimer's disease Mother    Heart attack Mother    Heart attack Father    Heart disease Brother     Social History   Tobacco Use   Smoking status: Never   Smokeless tobacco: Never  Substance Use Topics   Alcohol use: Yes    Comment: ocass   Drug use: Never    Home Medications Prior to Admission medications   Medication Sig Start Date End Date Taking? Authorizing Provider  acetaminophen (TYLENOL)  500 MG tablet Take 1,000 mg by mouth every 6 (six) hours as needed for mild pain or headache.   Yes [provider]  doxycycline (VIBRAMYCIN) 100 MG capsule Take 1 capsule (100 mg total) by mouth 2 (two) times daily. 03/16/21  Yes Zaevion Parke, [place] N, PA-C  ibuprofen (ADVIL) 200 MG tablet Take 400 mg by mouth every 6 (six) hours as needed for mild pain.   Yes [provider]  loratadine (CLARITIN) 10 MG tablet Take 10 mg by mouth daily as needed for allergies.   Yes [provider]  predniSONE (DELTASONE) 20 MG tablet Take 1 tablet (20 mg total) by mouth daily for 5 days. 03/17/21 03/22/21 Yes Roxan Hockey, [place] N, PA-C  Probiotic Product (PROBIOTIC DAILY PO) Take 1 capsule by mouth at bedtime.   Yes [provider]  Vitamin D, Ergocalciferol, (DRISDOL) 1.25 MG (50000 UNIT) CAPS capsule Take 50,000 Units by mouth every Wednesday. 06/13/20  Yes [provider]    Allergies    Penicillins and Sulfamethoxazole  Review of Systems   Review of Systems  Respiratory:  Positive for cough, chest tightness, shortness of breath and wheezing.   All other systems reviewed and are negative.  Physical Exam Updated Vital Signs BP (!) 166/95   Pulse 96  Temp 98.1 F (36.7 C) (Oral)   Resp (!) 22   SpO2 97%   Physical Exam Vitals and nursing note reviewed.  Constitutional:      General: She is not in acute distress.    Appearance: She is well-developed. She is not ill-appearing.  HENT:     Head: Normocephalic and atraumatic.  Eyes:     Conjunctiva/sclera: Conjunctivae normal.  Cardiovascular:     Rate and Rhythm: Normal rate and regular rhythm.     Pulses: Normal pulses.  Pulmonary:     Effort: Pulmonary effort is normal. No respiratory distress.     Breath sounds: Normal breath sounds.     Comments: Speaking in full sentences Abdominal:     General: Bowel sounds are normal.     Palpations: Abdomen is soft.  Musculoskeletal:     Right lower leg: No  edema.     Left lower leg: No edema.     Comments: No calf tenderness or lower extremity edema  Skin:    General: Skin is warm.  Neurological:     Mental Status: She is alert.  Psychiatric:        Behavior: Behavior normal.    ED Results / Procedures / Treatments   Labs (all labs ordered are listed, but only abnormal results are displayed) Labs Reviewed  BASIC METABOLIC PANEL - Abnormal; Notable for the following components:      Result Value   CO2 16 (*)    All other components within normal limits  CBC WITH DIFFERENTIAL/PLATELET - Abnormal; Notable for the following components:   WBC 11.9 (*)    Platelets 413 (*)    Neutro Abs 8.8 (*)    All other components within normal limits  D-DIMER, QUANTITATIVE - Abnormal; Notable for the following components:   D-Dimer, Quant 0.76 (*)    All other components within normal limits  RESP PANEL BY RT-PCR (FLU A&B, COVID) ARPGX2  BRAIN NATRIURETIC PEPTIDE  TROPONIN I (HIGH SENSITIVITY)    EKG None  Radiology DG Chest 2 View  Result Date: 03/16/2021 CLINICAL DATA:  Shortness of breath. EXAM: CHEST - 2 VIEW COMPARISON:  Chest x-ray dated July 22, 2020. FINDINGS: The heart size and mediastinal contours are within normal limits. Both lungs are clear. The visualized skeletal structures are unremarkable. IMPRESSION: No active cardiopulmonary disease. Electronically Signed   By: Obie Dredge M.D.   On: 03/16/2021 17:27   CT Angio Chest PE W/Cm &/Or Wo Cm  Result Date: 03/16/2021 CLINICAL DATA:  Dyspnea EXAM: CT ANGIOGRAPHY CHEST WITH CONTRAST TECHNIQUE: Multidetector CT imaging of the chest was performed using the standard protocol during bolus administration of intravenous contrast. Multiplanar CT image reconstructions and MIPs were obtained to evaluate the vascular anatomy. CONTRAST:  65mL OMNIPAQUE IOHEXOL 350 MG/ML SOLN COMPARISON:  None. FINDINGS: Cardiovascular: There is adequate opacification of the pulmonary arterial tree. No  intraluminal filling defect identified to suggest acute pulmonary embolism. The central pulmonary arteries are of normal caliber. Mild coronary artery calcification. Global cardiac size is within normal limits. No pericardial effusion. The systemic arterial system is essentially non-opacified on this examination. There is aneurysmal dilation of the thoracic aorta which measures 5.4 cm in maximal dimension in the ascending segment, 3.6 cm in diameter within the arch just beyond the takeoff of the left subclavian artery, and 3.2 cm in diameter at the level of the left atrium. Phase of contrast administration precludes evaluation for aortic dissection. Minimal atherosclerotic calcification. There is suggestion of  a bicuspid aortic valve, however, this is not well assessed on this examination. Mediastinum/Nodes: No pathologic thoracic adenopathy. The thyroid gland is atrophic. The esophagus is unremarkable. Lungs/Pleura: There is mild bronchial wall thickening present likely reflecting changes of airway inflammation. The lungs are clear save for minimal atelectasis within the basilar lingula and scattered benign calcified granuloma. No pneumothorax or pleural effusion. No central obstructing mass. Upper Abdomen: No acute abnormality within the visualized upper abdomen. Musculoskeletal: No acute bone abnormality. No lytic or blastic bone lesion. Review of the MIP images confirms the above findings. IMPRESSION: No acute pulmonary embolism. Mild coronary artery calcification. Thoracic aortic aneurysm with maximal transaxial dimension of the ascending thoracic aorta of 5.4 cm. Recommend semi-annual imaging followup by CTA or MRA and referral to cardiothoracic surgery if not already obtained. This recommendation follows 2010 ACCF/AHA/AATS/ACR/ASA/SCA/SCAI/SIR/STS/SVM Guidelines for the Diagnosis and Management of Patients With Thoracic Aortic Disease. Circulation. 2010; 121: J478-G956: E266-e369. Aortic aneurysm NOS (ICD10-I71.9) Mild  bronchial wall thickening in keeping with airway inflammation. No superimposed focal pulmonary infiltrate. Aortic Atherosclerosis (ICD10-I70.0). Aortic Atherosclerosis (ICD10-I70.0) and Emphysema (ICD10-J43.9). Electronically Signed   By: Helyn NumbersAshesh  Parikh MD   On: 03/16/2021 22:24    Procedures Procedures   Medications Ordered in ED Medications  iohexol (OMNIPAQUE) 350 MG/ML injection 80 mL (80 mLs Intravenous Contrast Given 03/16/21 2149)  albuterol (VENTOLIN HFA) 108 (90 Base) MCG/ACT inhaler 2 puff (2 puffs Inhalation Given 03/16/21 2225)  doxycycline (VIBRA-TABS) tablet 100 mg (100 mg Oral Given 03/16/21 2251)  predniSONE (DELTASONE) tablet 60 mg (60 mg Oral Given 03/16/21 2251)    ED Course  I have reviewed the triage vital signs and the nursing notes.  Pertinent labs & imaging results that were available during my care of the patient were reviewed by me and considered in my medical decision making (see chart for details).    MDM Rules/Calculators/A&P                          Patient is 75 year old female presenting for intermittent shortness of breath.  She states it feels like prior asthma symptoms though has not had asthma in quite a while.  Had quite a bit of shortness of breath earlier, however that has resolved and she is asymptomatic on exam.  She does not see a primary care physician and is not on any daily medications.  Her age is her risk factor for PE, she is also tachycardic here though attributes this to nerves related to being in the emergency department.  Lungs are clear, normal work of breathing.  She is also tachypneic though not hypoxic.  Discussed with attending physician Dr. Donnald GarrePfeiffer.   Blood work reveals troponin within normal limits, BMP within normal limits.  D-dimer is elevated and age-adjusted to 0.73.  CBC with mild leukocytosis of 11.9.  Chest x-ray is clear.  CTA is ordered due to elevated D-dimer.  CTA is negative for PE or other acute process.  It does show  incidental findings of ascending thoracic aortic aneurysm measuring as large as 5.6 cm in diameter.  She is made aware of these incidental findings and verbalizes understanding of the importance of semiannual follow-up with CTA or MRA.  She is provided with CT surgery referral as well as primary care to establish.  Regarding presenting symptoms today, she will be treated with prednisone burst, doxycycline, and sent home with albuterol inhaler.  Instructed to return if symptoms worsen.  She remains well-appearing and is no  distress on examination, feels comfortable discharge to home.  Discussed results, findings, treatment and follow up. Patient advised of return precautions. Patient verbalized understanding and agreed with plan.  Final Clinical Impression(s) / ED Diagnoses Final diagnoses:  Bronchitis  Thoracic ascending aortic aneurysm (HCC)    Rx / DC Orders ED Discharge Orders          Ordered    predniSONE (DELTASONE) 20 MG tablet  Daily        03/16/21 2251    doxycycline (VIBRAMYCIN) 100 MG capsule  2 times daily        03/16/21 2251             Nashaun Hillmer, [place] N, PA-C 03/16/21 2258    Arby Barrette, MD 03/20/21 [phone]

## 2021-03-17 LAB — RESP PANEL BY RT-PCR (FLU A&B, COVID) ARPGX2
Influenza A by PCR: NEGATIVE
Influenza B by PCR: NEGATIVE
SARS Coronavirus 2 by RT PCR: NEGATIVE

## 2021-03-28 ENCOUNTER — Other Ambulatory Visit: Payer: Self-pay | Admitting: Family

## 2021-03-28 DIAGNOSIS — Z1231 Encounter for screening mammogram for malignant neoplasm of breast: Secondary | ICD-10-CM

## 2021-04-01 ENCOUNTER — Other Ambulatory Visit: Payer: Self-pay

## 2021-04-01 ENCOUNTER — Ambulatory Visit
Admission: RE | Admit: 2021-04-01 | Discharge: 2021-04-01 | Disposition: A | Discharge status: Home / Self Care | Payer: Medicare Other | Source: Ambulatory Visit | Attending: Family | Admitting: Family

## 2021-04-01 DIAGNOSIS — Z1231 Encounter for screening mammogram for malignant neoplasm of breast: Secondary | ICD-10-CM

## 2021-04-07 ENCOUNTER — Other Ambulatory Visit: Payer: Self-pay | Admitting: Family

## 2021-04-07 DIAGNOSIS — Z780 Asymptomatic menopausal state: Secondary | ICD-10-CM

## 2021-04-08 ENCOUNTER — Encounter: Payer: Medicare Other | Admitting: Thoracic Surgery (Cardiothoracic Vascular Surgery)

## 2021-04-16 ENCOUNTER — Other Ambulatory Visit: Payer: Self-pay | Admitting: Thoracic Surgery (Cardiothoracic Vascular Surgery)

## 2021-04-16 ENCOUNTER — Institutional Professional Consult (permissible substitution) (INDEPENDENT_AMBULATORY_CARE_PROVIDER_SITE_OTHER): Payer: Medicare Other | Admitting: Thoracic Surgery (Cardiothoracic Vascular Surgery)

## 2021-04-16 ENCOUNTER — Other Ambulatory Visit: Payer: Self-pay

## 2021-04-16 VITALS — BP 102/72 | HR 100 | Resp 20 | Ht 65.0 in | Wt 118.0 lb

## 2021-04-16 DIAGNOSIS — I712 Thoracic aortic aneurysm, without rupture, unspecified: Secondary | ICD-10-CM

## 2021-04-16 DIAGNOSIS — I7121 Aneurysm of the ascending aorta, without rupture: Secondary | ICD-10-CM

## 2021-04-16 NOTE — Progress Notes (Addendum)
PCP is Aida Puffer, MD Referring Provider is Robinson, [place] N, PA-C  Chief Complaint  Patient presents with   Thoracic Aortic Aneurysm    Surgical consult, Chest CTA  03/16/21    HPI: Colleen Parker is sent for consultation regarding an ascending aortic aneurysm.  Colleen Parker is a 75 year old woman with a past medical history significant for chronic migraines, recurrent sinus infections, and asthma.  She has no history of hypertension, hyperlipidemia, aneurysms, or heart trouble.  She does have a family history with her grandmother having an aortic aneurysm and a brother who is being followed for an abdominal aneurysm.  Her father had a sudden cardiac death but did have known heart disease.  She presents to the emergency room in June with shortness of breath.  She had the shortness of breath come on suddenly and was concerned by the time she got to the hospital it was severe.  As part of her work-up she had a CT to rule out pulmonary embolus.  There was no sign of pulmonary embolus, pneumonia or pleural effusion.  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  She denies any chest pain, pressure, or tightness.  She does have asthma and occasional wheezing and shortness of breath.  She has chronic migraines.  No orthopnea, paroxysmal nocturnal dyspnea or peripheral edema.   Past Medical History:  Diagnosis Date   Chronic headaches    Menopausal state    Migraines    Recurrent sinus infections     Past Surgical History:  Procedure Laterality Date   DILATION AND CURETTAGE OF UTERUS     abnl cells   TONSILLECTOMY  1956    Family History  Problem Relation Age of Onset   Alzheimer's disease Mother    Heart attack Mother    Heart attack Father    Heart disease Brother     Social History Social History   Tobacco Use   Smoking status: Never   Smokeless tobacco: Never  Substance Use Topics   Alcohol use: Yes    Comment: ocass   Drug use: Never    Current Outpatient  Medications  Medication Sig Dispense Refill   acetaminophen (TYLENOL) 500 MG tablet Take 1,000 mg by mouth every 6 (six) hours as needed for mild pain or headache.     doxycycline (VIBRAMYCIN) 100 MG capsule Take 1 capsule (100 mg total) by mouth 2 (two) times daily. 14 capsule 0   ibuprofen (ADVIL) 200 MG tablet Take 400 mg by mouth every 6 (six) hours as needed for mild pain.     loratadine (CLARITIN) 10 MG tablet Take 10 mg by mouth daily as needed for allergies.     Probiotic Product (PROBIOTIC DAILY PO) Take 1 capsule by mouth at bedtime.     Vitamin D, Ergocalciferol, (DRISDOL) 1.25 MG (50000 UNIT) CAPS capsule Take 50,000 Units by mouth every Wednesday.     No current facility-administered medications for this visit.    Allergies  Allergen Reactions   Penicillins Shortness Of Breath   Quinolones Other (See Comments)    Aneurysm   Sulfamethoxazole Hives    Review of Systems  Constitutional:  Negative for activity change, appetite change and unexpected weight change.  HENT:  Negative for trouble swallowing and voice change.   Respiratory:  Positive for shortness of breath and wheezing.   Cardiovascular:  Negative for chest pain and leg swelling.  Gastrointestinal:  Negative for abdominal pain.  Genitourinary:  Negative for difficulty urinating  and dysuria.  Musculoskeletal:  Negative for arthralgias and back pain.  Neurological:  Positive for headaches. Negative for syncope and weakness.       Memory problems  All other systems reviewed and are negative.  BP 102/72   Pulse 100   Resp 20   Ht 5\' 5"  (1.651 m)   Wt 118 lb (53.5 kg)   SpO2 96% Comment: RA  BMI 19.64 kg/m  Physical Exam Vitals reviewed.  Constitutional:      General: She is not in acute distress.    Appearance: Normal appearance.  HENT:     Head: Normocephalic and atraumatic.  Eyes:     General: No scleral icterus.    Extraocular Movements: Extraocular movements intact.  Neck:     Vascular: No  carotid bruit.  Cardiovascular:     Rate and Rhythm: Normal rate and regular rhythm.     Heart sounds: Murmur (Faint diastolic murmur) heard.  Pulmonary:     Effort: Pulmonary effort is normal. No respiratory distress.     Breath sounds: Normal breath sounds. No wheezing or rales.  Abdominal:     General: There is no distension.     Palpations: Abdomen is soft.     Tenderness: There is no abdominal tenderness.     Comments: Palpable abdominal aortic pulse  Skin:    General: Skin is warm and dry.  Neurological:     General: No focal deficit present.     Mental Status: She is alert and oriented to person, place, and time.     Cranial Nerves: No cranial nerve deficit.     Motor: No weakness.    Diagnostic Tests: CT ANGIOGRAPHY CHEST WITH CONTRAST   TECHNIQUE: Multidetector CT imaging of the chest was performed using the standard protocol during bolus administration of intravenous contrast. Multiplanar CT image reconstructions and MIPs were obtained to evaluate the vascular anatomy.   CONTRAST:  75mL OMNIPAQUE IOHEXOL 350 MG/ML SOLN   COMPARISON:  None.   FINDINGS: Cardiovascular: There is adequate opacification of the pulmonary arterial tree. No intraluminal filling defect identified to suggest acute pulmonary embolism. The central pulmonary arteries are of normal caliber.   Mild coronary artery calcification. Global cardiac size is within normal limits. No pericardial effusion. The systemic arterial system is essentially non-opacified on this examination. There is aneurysmal dilation of the thoracic aorta which measures 5.4 cm in maximal dimension in the ascending segment, 3.6 cm in diameter within the arch just beyond the takeoff of the left subclavian artery, and 3.2 cm in diameter at the level of the left atrium. Phase of contrast administration precludes evaluation for aortic dissection. Minimal atherosclerotic calcification. There is suggestion of a bicuspid aortic  valve, however, this is not well assessed on this examination.   Mediastinum/Nodes: No pathologic thoracic adenopathy. The thyroid gland is atrophic. The esophagus is unremarkable.   Lungs/Pleura: There is mild bronchial wall thickening present likely reflecting changes of airway inflammation. The lungs are clear save for minimal atelectasis within the basilar lingula and scattered benign calcified granuloma. No pneumothorax or pleural effusion. No central obstructing mass.   Upper Abdomen: No acute abnormality within the visualized upper abdomen.   Musculoskeletal: No acute bone abnormality. No lytic or blastic bone lesion.   Review of the MIP images confirms the above findings.   IMPRESSION: No acute pulmonary embolism.   Mild coronary artery calcification.   Thoracic aortic aneurysm with maximal transaxial dimension of the ascending thoracic aorta of 5.4 cm. Recommend  semi-annual imaging followup by CTA or MRA and referral to cardiothoracic surgery if not already obtained. This recommendation follows 2010 ACCF/AHA/AATS/ACR/ASA/SCA/SCAI/SIR/STS/SVM Guidelines for the Diagnosis and Management of Patients With Thoracic Aortic Disease. Circulation. 2010; 121: O175-Z025. Aortic aneurysm NOS (ICD10-I71.9)   Mild bronchial wall thickening in keeping with airway inflammation. No superimposed focal pulmonary infiltrate.   Aortic Atherosclerosis (ICD10-I70.0). Aortic Atherosclerosis (ICD10-I70.0) and Emphysema (ICD10-J43.9).     Electronically Signed   By: Helyn Numbers MD   On: 03/16/2021 22:24   I personally reviewed the CT images.  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  Unfortunately the contrast was timed to the pulmonary circulation and that severely degrades the image quality regarding the ascending aorta and arch.  She has generalized aortomegaly.  There is evidence of aortic and coronary atherosclerotic disease.  Impression: Colleen Parker is a 75 year old woman with a  history of migraines, recurrent sinus infections and asthma.  She has no history of hypertension, hyperlipidemia, heart murmur prior to her recent ED visit, or any cardiac disease.  She presented to the ED about a month ago with shortness of breath.  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  She has no history of hypertension and her blood pressure is normal today.  She has not had any recurrent episodes or shortness of breath.  She also denies any chest pain, pressure, or tightness.  No peripheral edema or orthopnea.  Ascending aneurysm-5.4cm aneurysm related this size is cause for significant concern.  Indexed to her height puts her in a high risk category with a 10 to 12% annual risk of catastrophic event such as dissection and/or rupture.  As such I think she needs to be evaluated for surgery.  Unfortunately, the timing of the contrast is such that the exact measurement of the ascending aorta and the assessment of the arch anatomy are severely limited.  She needs a CT angiogram to better evaluate the ascending aorta and arch to assist with surgical planning.  She does have a faint diastolic murmur.  Given the size of her ascending aorta I would be surprised if she had some aortic insufficiency.  She needs a 2D echocardiogram to evaluate left ventricular function and assess for any valvular pathology.  Her aorta is easily palpable on exam is not definitively aneurysmal.  However, given her history, I think it is important to assess the abdominal aorta.  We will do a ultrasound of that as well.  I will see her back if she has those studies.  Then if we are definitively going to proceed with surgery we will send her for cardiology evaluation and cardiac catheterization.  She was advised not to lift anything over 25 pounds.  She also was advised not to take any quinolone antibiotics.  Plan: Ultrasound abdominal aorta CT angiogram of  chest to assess ascending aneurysm for surgical planning 2D echocardiogram to evaluate left-ventricular function and assess for aortic valve disease  I spent over 45 minutes in review of records, CT images, and in consultation with Colleen Parker today. Loreli Slot, MD [facility] [phone]  Addendum to correct spelling of name in HPI.  [facility]

## 2021-04-16 NOTE — Patient Instructions (Signed)
Do not lift over 25 pounds  Avoid Quinolone antibiotics

## 2021-04-23 ENCOUNTER — Other Ambulatory Visit: Payer: Self-pay

## 2021-04-23 ENCOUNTER — Ambulatory Visit (HOSPITAL_COMMUNITY)
Admission: RE | Admit: 2021-04-23 | Discharge: 2021-04-23 | Disposition: A | Discharge status: Home / Self Care | Payer: Medicare Other | Source: Ambulatory Visit | Attending: Thoracic Surgery (Cardiothoracic Vascular Surgery) | Admitting: Thoracic Surgery (Cardiothoracic Vascular Surgery)

## 2021-04-23 DIAGNOSIS — I712 Thoracic aortic aneurysm, without rupture, unspecified: Secondary | ICD-10-CM

## 2021-04-24 ENCOUNTER — Other Ambulatory Visit: Payer: Medicare Other

## 2021-04-24 ENCOUNTER — Ambulatory Visit (HOSPITAL_BASED_OUTPATIENT_CLINIC_OR_DEPARTMENT_OTHER)
Admission: RE | Admit: 2021-04-24 | Discharge: 2021-04-24 | Disposition: A | Discharge status: Home / Self Care | Payer: Medicare Other | Source: Ambulatory Visit | Attending: Thoracic Surgery (Cardiothoracic Vascular Surgery) | Admitting: Thoracic Surgery (Cardiothoracic Vascular Surgery)

## 2021-04-24 DIAGNOSIS — I712 Thoracic aortic aneurysm, without rupture, unspecified: Secondary | ICD-10-CM

## 2021-04-24 LAB — ECHOCARDIOGRAM COMPLETE
Area-P 1/2: 3.19 cm2
Calc EF: 38.8 %
P 1/2 time: 459 msec
S' Lateral: 3 cm
Single Plane A2C EF: 41 %
Single Plane A4C EF: 41.6 %

## 2021-04-24 NOTE — Progress Notes (Signed)
  Echocardiogram 2D Echocardiogram has been performed.  Colleen Parker 04/24/2021, 2:01 PM

## 2021-05-06 ENCOUNTER — Other Ambulatory Visit: Payer: Self-pay

## 2021-05-06 ENCOUNTER — Ambulatory Visit
Admission: RE | Admit: 2021-05-06 | Discharge: 2021-05-06 | Disposition: A | Discharge status: Home / Self Care | Payer: Medicare Other | Source: Ambulatory Visit | Attending: Thoracic Surgery (Cardiothoracic Vascular Surgery) | Admitting: Thoracic Surgery (Cardiothoracic Vascular Surgery)

## 2021-05-06 DIAGNOSIS — I712 Thoracic aortic aneurysm, without rupture, unspecified: Secondary | ICD-10-CM

## 2021-05-06 MED ORDER — IOPAMIDOL (ISOVUE-370) INJECTION 76%
75.0000 mL | Freq: Once | INTRAVENOUS | Status: AC | PRN
  Administered 2021-05-06: 75 mL via INTRAVENOUS

## 2021-05-14 ENCOUNTER — Ambulatory Visit (INDEPENDENT_AMBULATORY_CARE_PROVIDER_SITE_OTHER): Payer: Medicare Other | Admitting: Thoracic Surgery (Cardiothoracic Vascular Surgery)

## 2021-05-14 ENCOUNTER — Other Ambulatory Visit: Payer: Self-pay

## 2021-05-14 VITALS — BP 126/78 | HR 117 | Resp 20 | Ht 65.0 in

## 2021-05-14 DIAGNOSIS — I712 Thoracic aortic aneurysm, without rupture, unspecified: Secondary | ICD-10-CM

## 2021-05-14 NOTE — Progress Notes (Signed)
[address]       Colleen Parker [address]             [phone]       HPI: Colleen Parker returns to discuss the results of her imaging studies and management of her ascending aneurysm.  Colleen Parker is a 75 year old woman with a history of asthma, chronic migraines, and recurrent sinus infections.  She has no history of hypertension, hyperlipidemia, aneurysms or heart trouble.  Her family history significant for a mother who had an aortic aneurysm and a brother who died from an abdominal aneurysm.  She was seen in the ED in June with shortness of breath.  She had a CT angio to rule out pulmonary embolus.  It showed a 5.3 to 5.4 cm ascending aneurysm although the contrast was suboptimal.  She is only about 5 5 and weighs about 110 pounds always concerned about the size of the aneurysm.  I recommended that we do a CT angiogram to specifically look at the aorta, an ultrasound to rule out an abdominal aneurysm, and an echocardiogram to assess for any aortic insufficiency.  She denies any chest pain, pressure, or tightness.  She does have occasional wheezing with her asthma and shortness of breath associated with that.  No orthopnea, PND, or peripheral edema.  Past Medical History:  Diagnosis Date   Chronic headaches    Menopausal state    Migraines    Recurrent sinus infections     Current Outpatient Medications  Medication Sig Dispense Refill   acetaminophen (TYLENOL) 500 MG tablet Take 1,000 mg by mouth every 6 (six) hours as needed for mild pain or headache.     albuterol (VENTOLIN HFA) 108 (90 Base) MCG/ACT inhaler Inhale into the lungs every 6 (six) hours as needed for wheezing or shortness of breath.     dexamethasone (DECADRON) 4 MG tablet Take 4 mg by mouth 2 (two) times daily with a meal.     ibuprofen (ADVIL) 200 MG tablet Take 400 mg by mouth every 6 (six) hours as needed for mild pain.     loratadine (CLARITIN) 10 MG tablet Take 10 mg by mouth daily as needed  for allergies.     Probiotic Product (PROBIOTIC DAILY PO) Take 1 capsule by mouth at bedtime.     doxycycline (VIBRAMYCIN) 100 MG capsule Take 1 capsule (100 mg total) by mouth 2 (two) times daily. 14 capsule 0   Vitamin D, Ergocalciferol, (DRISDOL) 1.25 MG (50000 UNIT) CAPS capsule Take 50,000 Units by mouth every Wednesday.     No current facility-administered medications for this visit.    Physical Exam BP 126/78 (BP Location: Right Arm, Patient Position: Sitting)   Pulse (!) 117   Resp 20   Ht 5\' 5"  (1.651 m)   SpO2 95% Comment: RA  BMI 19.64 kg/m   Diagnostic Tests: CT ANGIOGRAPHY CHEST WITH CONTRAST   TECHNIQUE: Multidetector CT imaging of the chest was performed using the standard protocol during bolus administration of intravenous contrast. Multiplanar CT image reconstructions and MIPs were obtained to evaluate the vascular anatomy.   CONTRAST:  38mL ISOVUE-370 IOPAMIDOL (ISOVUE-370) INJECTION 76%   COMPARISON:  03/16/2021   FINDINGS: Cardiovascular:   Heart:   Heart size unchanged with cardiomegaly. Trace pericardial fluid/thickening. Calcifications of the left anterior descending coronary artery.   Aorta:   No significant aortic valve calcifications.   Diameter of the annulus estimated 2.5 cm on the coronal images.  Diameter of the sino-tubular junction, 3.2 cm on the coronal images.   Estimated diameter of ascending aorta on this non gated study, 5.3 cm.   No dissection, periaortic fluid, no pedunculated plaque, or ulcerated plaque. Mild atherosclerosis of the aortic arch. Mild atherosclerosis of the distal thoracic aorta.   Common origin of the innominate artery an the left common carotid artery. Tortuous branch vessels. Cervical cerebral vessels patent at the base of the neck.   Pulmonary arteries:   Timing of the contrast bolus is not optimized for evaluation of pulmonary artery filling defects.   Mediastinum/Nodes: Single subcentimeter  low-density nodule of the left thyroid lobe, does not require follow-up in a patient of this age. Unremarkable thoracic inlet.   Small lymph nodes of the mediastinum.   Lungs/Pleura: Central airways are clear. No pleural effusion. No confluent airspace disease.   No pneumothorax.   Atelectasis/scarring at the lung bases. Calcified granuloma of the right lower lobe. Mild bronchial wall thickening.   Upper Abdomen: No acute.   Musculoskeletal: Degenerative changes of the spine. Kyphotic curvature. No bony canal narrowing. No acute displaced fracture   Review of the MIP images confirms the above findings.   IMPRESSION: Unchanged configuration and size of ascending aorta, estimated 5.3 cm on the current CT. Aortic aneurysm NOS (ICD10-I71.9).   Aortic atherosclerosis and coronary artery disease. Aortic Atherosclerosis (ICD10-I70.0).   Signed,   Yvone Neu. Colleen Parker, RPVI   [facility]   [facility]     Electronically Signed   By: Gilmer Mor D.O.   On: 05/06/2021 13:36  Abdominal ultrasound 04/23/2021 Summary:  Abdominal Aorta: No evidence of an abdominal aortic aneurysm was  visualized. The largest aortic measurement is 2.1 cm.   Echocardiogram 04/24/2021 IMPRESSIONS     1. Left ventricular ejection fraction, by estimation, is 50 to 55%. The  left ventricle has low normal function. The left ventricle has no regional  wall motion abnormalities. Left ventricular diastolic parameters were  normal.   2. Right ventricular systolic function is normal. The right ventricular  size is normal. There is normal pulmonary artery systolic pressure.   3. Pericardial area anterior to RV has small effusion. There is material  on the surface of the RV, cannot exclude coagulum vs atypical appearance  of pericardial fat pad. a small pericardial effusion is present. The  pericardial effusion is anterior to  the right ventricle.   4. The  mitral valve is normal in structure. Trivial mitral valve  regurgitation. No evidence of mitral stenosis.   5. The aortic valve is tricuspid. Aortic valve regurgitation is mild.  Mild aortic valve sclerosis is present, with no evidence of aortic valve  stenosis. Aortic regurgitation PHT measures 459 msec.   6. Aortic dilatation noted. There is dilatation of the ascending aorta,  measuring 39 mm.   7. The inferior vena cava is normal in size with greater than 50%  respiratory variability, suggesting right atrial pressure of 3 mmHg.   I personally reviewed the CT angio images.  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  Aorta is about 3.2 cm and sinotubular junction and has normal sinus of Valsalva and sinotubular junction morphology.  Bovine arch anatomy with common origin of innominate and left common carotid.  Aorta returns to 3.3 to 3.4 cm past the takeoff of those vessels.  Coronary calcification also noted  Impression: Suezanne Cheshire with a history of asthma, chronic migraines, and recurrent  sinus infections.  She presented to the emergency room recently with shortness of breath.  A CT angio was done to rule out pulmonary embolus.  It showed a 5.3 to 5.4 cm ascending aneurysm.  It was a PE study so the aorta was not well opacified.  Ascending aneurysm-5.3 to 5.4 cm in diameter.  Although the guidelines generally state 5.5 cm for repair of an ascending aneurysm, when taking into account her small BSA I think her aneurysm is large enough to warrant intervention.  Indexed to her height she is in a high risk category with a 10 to 12% annual risk of catastrophic event.  Reviewing her images I think the aortic to be replaced from the sinotubular junction to just beyond the common takeoff of the innominate and left common carotid.  It would require hypothermic circulatory arrest.  She has a normal appearance of her aortic root and only mild aortic insufficiency, so I  do not think that a root replacement would be necessary.  I described the proposed operation to her.  It would involve axillary cannulation and a median sternotomy.  I informed her of the general nature of the procedure including the need for general anesthesia, the use of cardiopulmonary bypass, use of hypothermic circulatory arrest, the use of drainage tubes postoperatively, the expected hospital stay, and the overall recovery.  I informed her of the indications, risks, benefits, and alternatives.  She understands the risks include, but are not limited to death, MI, stroke, DVT, PE, bleeding, need for transfusion, infection, cardiac arrhythmias, respiratory or renal failure, as well as possibility of other unforeseeable complications.  She understands there is a risk of stroke or other neurologic event with the use of hypothermic circulatory arrest.  She would need cardiac catheterization prior to surgical repair.  After our discussion she is undecided as to whether she wants to proceed with surgery.  She would like to talk to her family.  I strongly advised her to do so.  If she decides to proceed she can call and we will get catheterization arranged and surgery scheduled.  I am going to go ahead and set her up for an appointment in 2 weeks so she can come back for further discussion should she need to do so.  Plan: She will discuss with family and if she decides to proceed we will call to schedule. Will need cardiac catheterization prior to surgical repair If she is still uncertain she will return in 2 weeks to further discuss management.  Loreli Slot, MD [facility] [phone]

## 2021-05-15 ENCOUNTER — Encounter: Payer: Self-pay | Admitting: Cardiology

## 2021-05-15 ENCOUNTER — Ambulatory Visit (INDEPENDENT_AMBULATORY_CARE_PROVIDER_SITE_OTHER): Payer: Medicare Other | Admitting: Cardiology

## 2021-05-15 VITALS — BP 113/79 | HR 92 | Ht 65.0 in | Wt 120.2 lb

## 2021-05-15 DIAGNOSIS — R9431 Abnormal electrocardiogram [ECG] [EKG]: Secondary | ICD-10-CM

## 2021-05-15 DIAGNOSIS — Z01810 Encounter for preprocedural cardiovascular examination: Secondary | ICD-10-CM | POA: Diagnosis not present

## 2021-05-15 DIAGNOSIS — Z01812 Encounter for preprocedural laboratory examination: Secondary | ICD-10-CM

## 2021-05-15 DIAGNOSIS — I712 Thoracic aortic aneurysm, without rupture, unspecified: Secondary | ICD-10-CM

## 2021-05-15 MED ORDER — METOPROLOL TARTRATE 100 MG PO TABS: ORAL_TABLET | ORAL | 0 refills | Status: DC

## 2021-05-15 NOTE — Patient Instructions (Addendum)
Medication Instructions:  Your physician recommends that you continue on your current medications as directed. Please refer to the Current Medication list given to you today.  Testing/Procedures: Coronary CTA-see instructions below  Follow-Up: At [facility], you and your health needs are our priority.  As part of our continuing mission to provide you with exceptional heart care, we have created designated Provider Care Teams.  These Care Teams include your primary Cardiologist (physician) and Advanced Practice Providers (APPs -  Physician Assistants and Nurse Practitioners) who all work together to provide you with the care you need, when you need it.  We recommend signing up for the patient portal called "MyChart".  Sign up information is provided on this After Visit Summary.  MyChart is used to connect with patients for Virtual Visits (Telemedicine).  Patients are able to view lab/test results, encounter notes, upcoming appointments, etc.  Non-urgent messages can be sent to your provider as well.   To learn more about what you can do with MyChart, go to [email or web address].    Your next appointment:   11/14 at 10 AM with Dr. Bjorn Pippin      Your cardiac CT will be scheduled at one of the below locations:   [facility] [address] [place], [place] [address] [phone]  If scheduled at [facility], please arrive at the [facility] main entrance (entrance A) of [facility] 30 minutes prior to test start time. Proceed to the [facility] Radiology Department (first floor) to check-in and test prep.  Please follow these instructions carefully (unless otherwise directed):  On the Night Before the Test: Be sure to Drink plenty of water. Do not consume any caffeinated/decaffeinated beverages or chocolate 12 hours prior to your test. Do not take any antihistamines 12 hours prior to your test.  On the Day of the Test: Drink plenty of water until 1  hour prior to the test. Do not eat any food 4 hours prior to the test. You may take your regular medications prior to the test.  Take metoprolol (Lopressor) two hours prior to test. FEMALES- please wear underwire-free bra if available, avoid dresses & tight clothing      After the Test: Drink plenty of water. After receiving IV contrast, you may experience a mild flushed feeling. This is normal. On occasion, you may experience a mild rash up to 24 hours after the test. This is not dangerous. If this occurs, you can take Benadryl 25 mg and increase your fluid intake. If you experience trouble breathing, this can be serious. If it is severe call 911 IMMEDIATELY. If it is mild, please call our office. If you take any of these medications: Glipizide/Metformin, Avandament, Glucavance, please do not take 48 hours after completing test unless otherwise instructed.  Please allow 2-4 weeks for scheduling of routine cardiac CTs. Some insurance companies require a pre-authorization which may delay scheduling of this test.   For non-scheduling related questions, please contact the cardiac imaging nurse navigator should you have any questions/concerns: Rockwell Alexandria, Cardiac Imaging Nurse Navigator Larey Brick, Cardiac Imaging Nurse Navigator [facility] Direct Office Dial: [phone]   For scheduling needs, including cancellations and rescheduling, please call [place], [phone].

## 2021-05-15 NOTE — Progress Notes (Signed)
Cardiology Office Note:    Date:  05/15/2021   ID:  Colleen Parker, DOB August 14, 1946, MRN [id number]  PCP:  Aida Puffer, MD  Cardiologist:  None  Electrophysiologist:  None   Referring MD: Loreli Slot, *   Chief Complaint  Patient presents with   Thoracic Aortic Aneurysm    History of Present Illness:    Colleen Parker is a 75 y.o. female with a hx of asthma, migraines who is referred by Dr. Dorris Fetch for evaluation of aortic aneurysm.  She presented with shortness of breath to the ED in June 2022.  CTPA showed no PE but found to have 5.3-5.4 cm ascending aortic aneurysm (no contrast suboptimal as was timed to review pulmonary arteries).  She saw Dr. Dorris Fetch in clinic and CTA chest was ordered on 05/06/21 which showed 5.3 cm ascending aortic aneurysm.  Dr. Dorris Fetch recommended surgery and referred to cardiology for cardiac catheterization prior to surgical repair.  Family history includes maternal grandmother died from aneurysm, mother had an aortic aneurysm and brother has an abdominal aneurysm.  She denies any chest pain.  Reports she has dyspnea that she attributes to asthma and seasonal allergies.  She denies any lightheadedness, syncope, lower extremity edema, or palpitations.  She walks 3 to 4 days/week for 30 minutes.  Never smoked.  Past Medical History:  Diagnosis Date   Chronic headaches    Menopausal state    Migraines    Recurrent sinus infections     Past Surgical History:  Procedure Laterality Date   DILATION AND CURETTAGE OF UTERUS     abnl cells   TONSILLECTOMY  1956    Current Medications: Current Meds  Medication Sig   acetaminophen (TYLENOL) 500 MG tablet Take 1,000 mg by mouth every 6 (six) hours as needed for mild pain or headache.   albuterol (VENTOLIN HFA) 108 (90 Base) MCG/ACT inhaler Inhale into the lungs every 6 (six) hours as needed for wheezing or shortness of breath.   dexamethasone (DECADRON) 4 MG tablet Take 4 mg by mouth 2 (two)  times daily with a meal.   doxycycline (VIBRAMYCIN) 100 MG capsule Take 1 capsule (100 mg total) by mouth 2 (two) times daily.   ibuprofen (ADVIL) 200 MG tablet Take 400 mg by mouth every 6 (six) hours as needed for mild pain.   loratadine (CLARITIN) 10 MG tablet Take 10 mg by mouth daily as needed for allergies.   metoprolol tartrate (LOPRESSOR) 100 MG tablet Take 1 tablet (100 mg) TWO hours prior to CT scan   Probiotic Product (PROBIOTIC DAILY PO) Take 1 capsule by mouth at bedtime.     Allergies:   Penicillins, Quinolones, and Sulfamethoxazole   Social History   Socioeconomic History   Marital status: Widowed    Spouse name: Not on file   Number of children: 0   Years of education: BA   Highest education level: Bachelor's degree (e.g., BA, AB, BS)  Occupational History   Occupation: Retired     Comment: Runner, broadcasting/film/video retired  Tobacco Use   Smoking status: Never   Smokeless tobacco: Never  Substance and Sexual Activity   Alcohol use: Yes    Comment: ocass   Drug use: Never   Sexual activity: Not on file  Other Topics Concern   Not on file  Social History Narrative   Lives at home and younger sister lives with her    Caffeine use: coffee  And tea sometimes   Right handed  Social Determinants of Health   Financial Resource Strain: Not on file  Food Insecurity: Not on file  Transportation Needs: Not on file  Physical Activity: Not on file  Stress: Not on file  Social Connections: Not on file     Family History: The patient's family history includes Alzheimer's disease in her mother; Heart attack in her father and mother; Heart disease in her brother.  ROS:   Please see the history of present illness.     All other systems reviewed and are negative.  EKGs/Labs/Other Studies Reviewed:    The following studies were reviewed today:   EKG:  EKG is ordered today.  The ekg ordered today demonstrates normal sinus rhythm, rate 92, left axis deviation, nonspecific T wave  flattening  Recent Labs: 07/22/2020: ALT 24 03/16/2021: B Natriuretic Peptide 79.5; BUN 11; Creatinine, Ser 0.84; Hemoglobin 13.0; Platelets 413; Potassium 4.5; Sodium 139  Recent Lipid Panel No results found for: CHOL, TRIG, HDL, CHOLHDL, VLDL, LDLCALC, LDLDIRECT  Physical Exam:    VS:  BP 113/79   Pulse 92   Ht 5\' 5"  (1.651 m)   Wt 120 lb 3.2 oz (54.5 kg)   SpO2 97%   BMI 20.00 kg/m     Wt Readings from Last 3 Encounters:  05/15/21 120 lb 3.2 oz (54.5 kg)  04/16/21 118 lb (53.5 kg)  08/23/20 120 lb 3.2 oz (54.5 kg)     GEN:  Well nourished, well developed in no acute distress HEENT: Normal NECK: No JVD; No carotid bruits LYMPHATICS: No lymphadenopathy CARDIAC: RRR, no murmurs, rubs, gallops RESPIRATORY:  Clear to auscultation without rales, wheezing or rhonchi  ABDOMEN: Soft, non-tender, non-distended MUSCULOSKELETAL:  No edema; No deformity  SKIN: Warm and dry NEUROLOGIC:  Alert and oriented x 3 PSYCHIATRIC:  Normal affect   ASSESSMENT:    1. Thoracic aortic aneurysm without rupture (HCC)   2. Pre-procedure lab exam   3. Pre-operative cardiovascular examination   4. Abnormal EKG    PLAN:     Thoracic aortic aneurysm: CTA chest was ordered on 05/06/21 which showed 5.3 cm ascending aortic aneurysm.  Echo 04/24/2021 showed EF 50 to 55%, normal RV function, small pericardial effusion, mild AI.  BP well controlled.  She is considering surgery, has been seen by Dr. 06/24/2021.  Referred here for ischemic evaluation.  After discussing risks and benefits of heart catheterization, she declines to proceed with heart cath.  She is agreeable to coronary CTA however.  We will proceed with coronary CTA.  Will give Lopressor 100 mg prior to study.  RTC in 3 months  Medication Adjustments/Labs and Tests Ordered: Current medicines are reviewed at length with the patient today.  Concerns regarding medicines are outlined above.  Orders Placed This Encounter  Procedures   CT CORONARY  MORPH W/CTA COR W/SCORE W/CA W/CM &/OR WO/CM   Basic metabolic panel    Meds ordered this encounter  Medications   metoprolol tartrate (LOPRESSOR) 100 MG tablet    Sig: Take 1 tablet (100 mg) TWO hours prior to CT scan    Dispense:  1 tablet    Refill:  0     Patient Instructions  Medication Instructions:  Your physician recommends that you continue on your current medications as directed. Please refer to the Current Medication list given to you today.  Testing/Procedures: Coronary CTA-see instructions below  Follow-Up: At [facility], you and your health needs are our priority.  As part of our continuing mission to provide you  with exceptional heart care, we have created designated Provider Care Teams.  These Care Teams include your primary Cardiologist (physician) and Advanced Practice Providers (APPs -  Physician Assistants and Nurse Practitioners) who all work together to provide you with the care you need, when you need it.  We recommend signing up for the patient portal called "MyChart".  Sign up information is provided on this After Visit Summary.  MyChart is used to connect with patients for Virtual Visits (Telemedicine).  Patients are able to view lab/test results, encounter notes, upcoming appointments, etc.  Non-urgent messages can be sent to your provider as well.   To learn more about what you can do with MyChart, go to [email or web address].    Your next appointment:   11/14 at 10 AM with Dr. Bjorn Pippin      Your cardiac CT will be scheduled at one of the below locations:   [facility] [address] [place], [place] [address] [phone]  If scheduled at [facility], [place], please arrive at the [facility] main entrance (entrance A) of [facility] 30 minutes prior to test start time. Proceed to the [facility] Radiology Department (first floor) to check-in and test prep.  Please follow these instructions carefully (unless otherwise  directed):  On the Night Before the Test: Be sure to Drink plenty of water. Do not consume any caffeinated/decaffeinated beverages or chocolate 12 hours prior to your test. Do not take any antihistamines 12 hours prior to your test.  On the Day of the Test: Drink plenty of water until 1 hour prior to the test. Do not eat any food 4 hours prior to the test. You may take your regular medications prior to the test.  Take metoprolol (Lopressor) two hours prior to test. FEMALES- please wear underwire-free bra if available, avoid dresses & tight clothing      After the Test: Drink plenty of water. After receiving IV contrast, you may experience a mild flushed feeling. This is normal. On occasion, you may experience a mild rash up to 24 hours after the test. This is not dangerous. If this occurs, you can take Benadryl 25 mg and increase your fluid intake. If you experience trouble breathing, this can be serious. If it is severe call 911 IMMEDIATELY. If it is mild, please call our office. If you take any of these medications: Glipizide/Metformin, Avandament, Glucavance, please do not take 48 hours after completing test unless otherwise instructed.  Please allow 2-4 weeks for scheduling of routine cardiac CTs. Some insurance companies require a pre-authorization which may delay scheduling of this test.   For non-scheduling related questions, please contact the cardiac imaging nurse navigator should you have any questions/concerns: Rockwell Alexandria, Cardiac Imaging Nurse Navigator Larey Brick, Cardiac Imaging Nurse Navigator [facility] Direct Office Dial: [phone]   For scheduling needs, including cancellations and rescheduling, please call [place], [phone].     Signed, Little Ishikawa, MD  05/15/2021 5:46 PM    [facility]

## 2021-05-16 ENCOUNTER — Telehealth (HOSPITAL_COMMUNITY): Payer: Self-pay | Admitting: Emergency Medicine

## 2021-05-16 LAB — BASIC METABOLIC PANEL
BUN/Creatinine Ratio: 28 (ref 12–28)
BUN: 22 mg/dL (ref 8–27)
CO2: 21 mmol/L (ref 20–29)
Calcium: 9.8 mg/dL (ref 8.7–10.3)
Chloride: 102 mmol/L (ref 96–106)
Creatinine, Ser: 0.8 mg/dL (ref 0.57–1.00)
Glucose: 104 mg/dL — ABNORMAL HIGH (ref 65–99)
Potassium: 4.6 mmol/L (ref 3.5–5.2)
Sodium: 141 mmol/L (ref 134–144)
eGFR: 78 mL/min/{1.73_m2} (ref >59)

## 2021-05-16 NOTE — Telephone Encounter (Signed)
Reaching out to patient to offer assistance regarding upcoming cardiac imaging study; pt verbalizes understanding of appt date/time, parking situation and where to check in, pre-test NPO status and medications ordered, and verified current allergies; name and call back number provided for further questions should they arise Amador Braddy RN Navigator Cardiac Imaging [facility] [phone] office [phone] cell   Denies iv issues Denies claustro 100mg metoprolol   

## 2021-05-20 ENCOUNTER — Ambulatory Visit (HOSPITAL_COMMUNITY)
Admission: RE | Admit: 2021-05-20 | Discharge: 2021-05-20 | Disposition: A | Discharge status: Home / Self Care | Payer: Medicare Other | Source: Ambulatory Visit | Attending: Cardiology | Admitting: Cardiology

## 2021-05-20 ENCOUNTER — Other Ambulatory Visit: Payer: Self-pay

## 2021-05-20 DIAGNOSIS — I2510 Atherosclerotic heart disease of native coronary artery without angina pectoris: Secondary | ICD-10-CM | POA: Diagnosis not present

## 2021-05-20 DIAGNOSIS — I712 Thoracic aortic aneurysm, without rupture, unspecified: Secondary | ICD-10-CM

## 2021-05-20 DIAGNOSIS — Z01810 Encounter for preprocedural cardiovascular examination: Secondary | ICD-10-CM | POA: Diagnosis present

## 2021-05-20 DIAGNOSIS — R9431 Abnormal electrocardiogram [ECG] [EKG]: Secondary | ICD-10-CM | POA: Diagnosis present

## 2021-05-20 MED ORDER — NITROGLYCERIN 0.4 MG SL SUBL
SUBLINGUAL_TABLET | SUBLINGUAL | Status: AC
  Administered 2021-05-20: 0.8 mg via SUBLINGUAL
  Filled 2021-05-20: qty 2

## 2021-05-20 MED ORDER — METOPROLOL TARTRATE 5 MG/5ML IV SOLN
INTRAVENOUS | Status: AC
  Administered 2021-05-20: 5 mg via INTRAVENOUS
  Filled 2021-05-20: qty 5

## 2021-05-20 MED ORDER — NITROGLYCERIN 0.4 MG SL SUBL: 0.8000 mg | SUBLINGUAL_TABLET | Freq: Once | SUBLINGUAL | Status: AC

## 2021-05-20 MED ORDER — IOHEXOL 350 MG/ML SOLN
75.0000 mL | Freq: Once | INTRAVENOUS | Status: AC | PRN
  Administered 2021-05-20: 75 mL via INTRAVENOUS

## 2021-05-20 MED ORDER — METOPROLOL TARTRATE 5 MG/5ML IV SOLN: 5.0000 mg | INTRAVENOUS | Status: DC | PRN

## 2021-05-23 ENCOUNTER — Other Ambulatory Visit: Payer: Self-pay

## 2021-05-23 DIAGNOSIS — E785 Hyperlipidemia, unspecified: Secondary | ICD-10-CM

## 2021-05-23 NOTE — Addendum Note (Signed)
Addended by: Dorris Fetch on: 05/23/2021 05:01 PM   Modules accepted: Orders

## 2021-05-27 ENCOUNTER — Other Ambulatory Visit: Payer: Self-pay

## 2021-05-27 ENCOUNTER — Ambulatory Visit (INDEPENDENT_AMBULATORY_CARE_PROVIDER_SITE_OTHER): Payer: Medicare Other | Admitting: Thoracic Surgery (Cardiothoracic Vascular Surgery)

## 2021-05-27 VITALS — BP 97/65 | HR 120 | Resp 20 | Ht 65.0 in | Wt 114.0 lb

## 2021-05-27 DIAGNOSIS — I712 Thoracic aortic aneurysm, without rupture: Secondary | ICD-10-CM

## 2021-05-27 DIAGNOSIS — I7121 Aneurysm of the ascending aorta, without rupture: Secondary | ICD-10-CM

## 2021-05-27 NOTE — Progress Notes (Signed)
[address]       [name] [address]             [phone]      HPI: [name] returns to discuss management of her ascending aortic aneurysm.  Colleen Parker is a 75 year old woman with a history of asthma, migraines, and recurrent sinus infections.  He she denies any history of hypertension, hyperlipidemia, aneurysms, or any other cardiac disease.  She does have a positive family history with both her mother and her brother who had abdominal aneurysms.  She presented in June with shortness of breath.  A CT angiogram showed a 5.3 to 5.4 cm ascending aneurysm.  I did an abdominal ultrasound which showed no abdominal aneurysm.  She had an echocardiogram which showed an ejection fraction of 55% with no regional wall motion abnormalities.  Referred her to cardiology for possible catheterization.  She refused that but did agree to a coronary CT.  It showed no hemodynamically significant stenoses.  Past Medical History:  Diagnosis Date   Chronic headaches    Menopausal state    Migraines    Recurrent sinus infections      Current Outpatient Medications  Medication Sig Dispense Refill   acetaminophen (TYLENOL) 500 MG tablet Take 1,000 mg by mouth every 6 (six) hours as needed for mild pain or headache.     albuterol (VENTOLIN HFA) 108 (90 Base) MCG/ACT inhaler Inhale into the lungs every 6 (six) hours as needed for wheezing or shortness of breath.     ibuprofen (ADVIL) 200 MG tablet Take 400 mg by mouth every 6 (six) hours as needed for mild pain.     loratadine (CLARITIN) 10 MG tablet Take 10 mg by mouth daily as needed for allergies.     metoprolol tartrate (LOPRESSOR) 100 MG tablet Take 1 tablet (100 mg) TWO hours prior to CT scan 1 tablet 0   Probiotic Product (PROBIOTIC DAILY PO) Take 1 capsule by mouth at bedtime.     No current facility-administered medications for this visit.    Physical Exam BP 97/65   Pulse (!) 120   Resp 20   Ht 5\' 5"  (1.651 m)   Wt  114 lb (51.7 kg)   SpO2 96% Comment: RA  BMI 18.47 kg/m  74 year old woman in no acute distress Anxious Alert and oriented x3 with no focal deficit Cardiac tachycardic, slightly irregular Lungs clear bilaterally  Diagnostic Tests: IMPRESSION: 1. Coronary calcium score of 319. This was 83rd percentile for age and sex matched control.   2. Normal coronary origin with anterior takeoff of RCA off the right coronary cusp with right dominance.   3.  Mild atherosclerosis.  CAD RADS 2.   4.  Recommend preventive therapy and risk factor modification.   65   Electronically Signed: By: Armanda Magic M.D. On: 05/20/2021 22:25   Impression: Colleen Parker is a 76 year old woman with no prior cardiac history who was incidentally noted to have a 5.3 cm ascending aneurysm on a CT angiogram done for evaluation of dyspnea back in June.  A CT angiogram in August showed 5.3 cm ascending aorta in the setting of generalized aortomegaly.  The sinotubular junction is about 3.2 cm and there is normal sinus of Valsalva anatomy.  She has only mild mitral regurgitation.  She has a bovine arch with, takeoff of the innominate and left common carotid.  The aorta returns to a relatively normal size beyond that.  I  recommended she undergo replacement of the ascending aorta and proximal arch due to her high risk of dissection or rupture if untreated.  Although her aorta does not quite reach 5.5 cm.  When accounting for her BSA it is a rather large aneurysm.  She does not wish to undergo aneurysm repair at this time.  She says that she is afraid that she will die.  I explained to her that the mortality is actually higher with medical therapy than with surgery.  Despite that she continues to refuse.  Plan: Return in 6 months with CT angiogram of chest If she were to change her mind we would be happy to schedule her surgery in the interim.  Loreli Slot, MD [facility] [phone]

## 2021-06-06 ENCOUNTER — Ambulatory Visit (INDEPENDENT_AMBULATORY_CARE_PROVIDER_SITE_OTHER): Payer: Medicare Other | Admitting: Podiatry

## 2021-06-06 ENCOUNTER — Encounter: Payer: Self-pay | Admitting: Podiatry

## 2021-06-06 ENCOUNTER — Other Ambulatory Visit: Payer: Self-pay

## 2021-06-06 DIAGNOSIS — M79674 Pain in right toe(s): Secondary | ICD-10-CM | POA: Diagnosis not present

## 2021-06-06 DIAGNOSIS — B351 Tinea unguium: Secondary | ICD-10-CM | POA: Diagnosis not present

## 2021-06-06 DIAGNOSIS — M79675 Pain in left toe(s): Secondary | ICD-10-CM

## 2021-06-06 NOTE — Progress Notes (Signed)
This patient returns to the office for evaluation and treatment of long thick painful nails .  This patient is unable to trim her own nails since the patient cannot reach her feet.  Patient says the nails are painful walking and wearing his shoes.  He returns for preventive foot care services.  General Appearance  Alert, conversant and in no acute stress.  Vascular  Dorsalis pedis and posterior tibial  pulses are palpable  bilaterally.  Capillary return is within normal limits  bilaterally. Temperature is within normal limits  bilaterally.  Neurologic  Senn-Weinstein monofilament wire test within normal limits  bilaterally. Muscle power within normal limits bilaterally.  Nails Thick disfigured discolored nails with subungual debris  from hallux to fifth toes bilaterally. No evidence of bacterial infection or drainage bilaterally.  Orthopedic  No limitations of motion  feet .  No crepitus or effusions noted.  HAV  B/L.  Hammet toes  B/L.  Skin  normotropic skin with no porokeratosis noted bilaterally.  No signs of infections or ulcers noted.     Onychomycosis  Pain in toes right foot  Pain in toes left foot  Debridement  of nails  1-5  B/L with a nail nipper.  Nails were then filed using a dremel tool with no incidents.    RTC 3 months    Zymire Turnbo DPM   

## 2021-08-01 ENCOUNTER — Telehealth: Payer: Self-pay

## 2021-08-01 NOTE — Telephone Encounter (Signed)
   [facility] Pre-operative Risk Assessment    Patient Name: Colleen Parker  DOB: October 27, 1945 MRN: [id number]  HEARTCARE STAFF:  - IMPORTANT!!!!!! Under Visit Info/Reason for Call, type in Other and utilize the format Clearance MM/DD/YY or Clearance TBD. Do not use dashes or single digits. - Please review there is not already an duplicate clearance open for this procedure. - If request is for dental extraction, please clarify the # of teeth to be extracted. - If the patient is currently at the dentist's office, call Pre-Op Callback Staff (MA/nurse) to input urgent request.  - If the patient is not currently in the dentist office, please route to the Pre-Op pool.  Request for surgical clearance:  What type of surgery is being performed? Cataract Extraction w/Intraocular Lens Implantation of the Right Eye  When is this surgery scheduled? 10/15/21  What type of clearance is required (medical clearance vs. Pharmacy clearance to hold med vs. Both)? MED  Are there any medications that need to be held prior to surgery and how long? N/A  Practice name and name of physician performing surgery? Not listed ([place] and [place])   What is the office phone number? [phone]   7.   What is the office fax number? [phone]  8.   Anesthesia type (None, local, MAC, general) ? Not listed.    Delaney Perona B Melisia Leming 08/01/2021, 3:51 PM  _________________________________________________________________   (provider comments below)

## 2021-08-01 NOTE — Telephone Encounter (Signed)
   Patient Name: Colleen Parker  DOB: Apr 26, 1946 MRN: [id number]  Primary Cardiologist: Little Ishikawa, MD  Chart reviewed as part of pre-operative protocol coverage. Cataract extractions are recognized in guidelines as low risk surgeries that do not typically require specific preoperative testing or holding of blood thinner therapy. Therefore, given past medical history and time since last visit, based on ACC/AHA guidelines, Colleen Parker would be at acceptable risk for the planned procedure without further cardiovascular testing.   I will route this recommendation to the requesting party via Epic fax function and remove from pre-op pool.  Please call with questions.  [place], [place] 08/01/2021, 4:17 PM

## 2021-08-03 NOTE — Progress Notes (Deleted)
Cardiology Office Note:    Date:  08/03/2021   ID:  Colleen Parker, DOB 1946/03/28, MRN [id number]  PCP:  Aida Puffer, MD  Cardiologist:  Little Ishikawa, MD  Electrophysiologist:  None   Referring MD: Aida Puffer, MD   No chief complaint on file.   History of Present Illness:    Colleen Parker is a 75 y.o. female with a hx of asthma, migraines who presents for follow-up.  She was referred by Dr. Dorris Fetch for evaluation of aortic aneurysm, initially seen on 05/15/2021.  She presented with shortness of breath to the ED in June 2022.  CTPA showed no PE but found to have 5.3-5.4 cm ascending aortic aneurysm (no contrast suboptimal as was timed to review pulmonary arteries).  She saw Dr. Dorris Fetch in clinic and CTA chest was ordered on 05/06/21 which showed 5.3 cm ascending aortic aneurysm.  Dr. Dorris Fetch recommended surgery and referred to cardiology for cardiac catheterization prior to surgical repair.  Family history includes maternal grandmother died from aneurysm, mother had an aortic aneurysm and brother has an abdominal aneurysm.  She denies any chest pain.  Reports she has dyspnea that she attributes to asthma and seasonal allergies.  She denies any lightheadedness, syncope, lower extremity edema, or palpitations.  She walks 3 to 4 days/week for 30 minutes.  Never smoked.  Coronary CTA on 05/20/2021 showed nonobstructive CAD, calcium score 319 (83rd percentile).  Echo on 04/24/2021 showed EF 50 to 55%, normal RV function, small pericardial effusion, mild AI.  Since last clinic visit,  Past Medical History:  Diagnosis Date   Chronic headaches    Menopausal state    Migraines    Recurrent sinus infections     Past Surgical History:  Procedure Laterality Date   DILATION AND CURETTAGE OF UTERUS     abnl cells   TONSILLECTOMY  1956    Current Medications: No outpatient medications have been marked as taking for the 08/04/21 encounter (Appointment) with Little Ishikawa, MD.     Allergies:   Penicillins, Quinolones, and Sulfamethoxazole   Social History   Socioeconomic History   Marital status: Widowed    Spouse name: Not on file   Number of children: 0   Years of education: BA   Highest education level: Bachelor's degree (e.g., BA, AB, BS)  Occupational History   Occupation: Retired     Comment: Runner, broadcasting/film/video retired  Tobacco Use   Smoking status: Never   Smokeless tobacco: Never  Substance and Sexual Activity   Alcohol use: Yes    Comment: ocass   Drug use: Never   Sexual activity: Not on file  Other Topics Concern   Not on file  Social History Narrative   Lives at home and younger sister lives with her    Caffeine use: coffee  And tea sometimes   Right handed   Social Determinants of Health   Financial Resource Strain: Not on file  Food Insecurity: Not on file  Transportation Needs: Not on file  Physical Activity: Not on file  Stress: Not on file  Social Connections: Not on file     Family History: The patient's family history includes Alzheimer's disease in her mother; Heart attack in her father and mother; Heart disease in her brother.  ROS:   Please see the history of present illness.     All other systems reviewed and are negative.  EKGs/Labs/Other Studies Reviewed:    The following studies were reviewed today:  EKG:  EKG is ordered today.  The ekg ordered today demonstrates normal sinus rhythm, rate 92, left axis deviation, nonspecific T wave flattening  Recent Labs: 03/16/2021: B Natriuretic Peptide 79.5; Hemoglobin 13.0; Platelets 413 05/15/2021: BUN 22; Creatinine, Ser 0.80; Potassium 4.6; Sodium 141  Recent Lipid Panel No results found for: CHOL, TRIG, HDL, CHOLHDL, VLDL, LDLCALC, LDLDIRECT  Physical Exam:    VS:  There were no vitals taken for this visit.    Wt Readings from Last 3 Encounters:  05/27/21 114 lb (51.7 kg)  05/15/21 120 lb 3.2 oz (54.5 kg)  04/16/21 118 lb (53.5 kg)     GEN:   Well nourished, well developed in no acute distress HEENT: Normal NECK: No JVD; No carotid bruits LYMPHATICS: No lymphadenopathy CARDIAC: RRR, no murmurs, rubs, gallops RESPIRATORY:  Clear to auscultation without rales, wheezing or rhonchi  ABDOMEN: Soft, non-tender, non-distended MUSCULOSKELETAL:  No edema; No deformity  SKIN: Warm and dry NEUROLOGIC:  Alert and oriented x 3 PSYCHIATRIC:  Normal affect   ASSESSMENT:    No diagnosis found.  PLAN:     Thoracic aortic aneurysm: CTA chest was ordered on 05/06/21 which showed 5.3 cm ascending aortic aneurysm.  Echo 04/24/2021 showed EF 50 to 55%, normal RV function, small pericardial effusion, mild AI.  BP well controlled.  She is considering surgery, has been seen by Dr. Dorris Fetch.  Referred here for ischemic evaluation.  Coronary CTA on 05/20/2021 showed nonobstructive CAD, calcium score 319 (83rd percentile).  Echo on 04/24/2021 showed EF 50 to 55%, normal RV function, small pericardial effusion, mild AI. -Follows with Dr. Dorris Fetch, has recommended surgery but patient currently declining.  RTC in***  Medication Adjustments/Labs and Tests Ordered: Current medicines are reviewed at length with the patient today.  Concerns regarding medicines are outlined above.  No orders of the defined types were placed in this encounter.   No orders of the defined types were placed in this encounter.    There are no Patient Instructions on file for this visit.   Signed, Little Ishikawa, MD  08/03/2021 4:46 PM    [facility]

## 2021-08-04 ENCOUNTER — Ambulatory Visit: Payer: Medicare Other | Admitting: Cardiology

## 2021-08-21 ENCOUNTER — Other Ambulatory Visit: Payer: Self-pay | Admitting: Internal Medicine

## 2021-08-21 DIAGNOSIS — M545 Low back pain, unspecified: Secondary | ICD-10-CM

## 2021-08-26 ENCOUNTER — Other Ambulatory Visit: Payer: Self-pay

## 2021-08-26 ENCOUNTER — Ambulatory Visit
Admission: RE | Admit: 2021-08-26 | Discharge: 2021-08-26 | Disposition: A | Discharge status: Home / Self Care | Payer: Medicare Other | Source: Ambulatory Visit | Attending: Internal Medicine | Admitting: Internal Medicine

## 2021-08-26 DIAGNOSIS — M545 Low back pain, unspecified: Secondary | ICD-10-CM

## 2021-09-04 ENCOUNTER — Ambulatory Visit: Payer: Medicare Other | Admitting: Student

## 2021-09-09 ENCOUNTER — Encounter: Payer: Self-pay | Admitting: Podiatry

## 2021-09-09 ENCOUNTER — Other Ambulatory Visit: Payer: Self-pay

## 2021-09-09 ENCOUNTER — Ambulatory Visit (INDEPENDENT_AMBULATORY_CARE_PROVIDER_SITE_OTHER): Payer: Medicare Other | Admitting: Podiatry

## 2021-09-09 DIAGNOSIS — M79675 Pain in left toe(s): Secondary | ICD-10-CM | POA: Diagnosis not present

## 2021-09-09 DIAGNOSIS — B351 Tinea unguium: Secondary | ICD-10-CM | POA: Diagnosis not present

## 2021-09-09 DIAGNOSIS — M79674 Pain in right toe(s): Secondary | ICD-10-CM

## 2021-09-09 NOTE — Progress Notes (Signed)
This patient returns to the office for evaluation and treatment of long thick painful nails .  This patient is unable to trim her own nails since the patient cannot reach her feet.  Patient says the nails are painful walking and wearing his shoes.  He returns for preventive foot care services.  General Appearance  Alert, conversant and in no acute stress.  Vascular  Dorsalis pedis and posterior tibial  pulses are  weakly palpable  bilaterally.  Capillary return is within normal limits  bilaterally. Cold feet  bilaterally.  Neurologic  Senn-Weinstein monofilament wire test within normal limits  bilaterally. Muscle power within normal limits bilaterally.  Nails Thick disfigured discolored nails with subungual debris  from hallux to fifth toes bilaterally. No evidence of bacterial infection or drainage bilaterally.  Orthopedic  No limitations of motion  feet .  No crepitus or effusions noted.  HAV  B/L.  Hammet toes  B/L.  Skin  normotropic skin with no porokeratosis noted bilaterally.  No signs of infections or ulcers noted.     Onychomycosis  Pain in toes right foot  Pain in toes left foot  Debridement  of nails  1-5  B/L with a nail nipper.  Nails were then filed using a dremel tool with no incidents.    RTC 3 months    Helane Gunther DPM

## 2021-09-17 IMAGING — MG MM DIGITAL SCREENING BILAT W/ TOMO AND CAD
8 series · 9 of 24 positions shown · non-contrast
Comparison: None.

CLINICAL DATA: Screening.

EXAM:
DIGITAL SCREENING BILATERAL MAMMOGRAM WITH TOMOSYNTHESIS AND CAD
TECHNIQUE: Bilateral screening digital craniocaudal and mediolateral oblique
mammograms were obtained. Bilateral screening digital breast
tomosynthesis was performed. The images were evaluated with
computer-aided detection.

[L CC synth-2D]
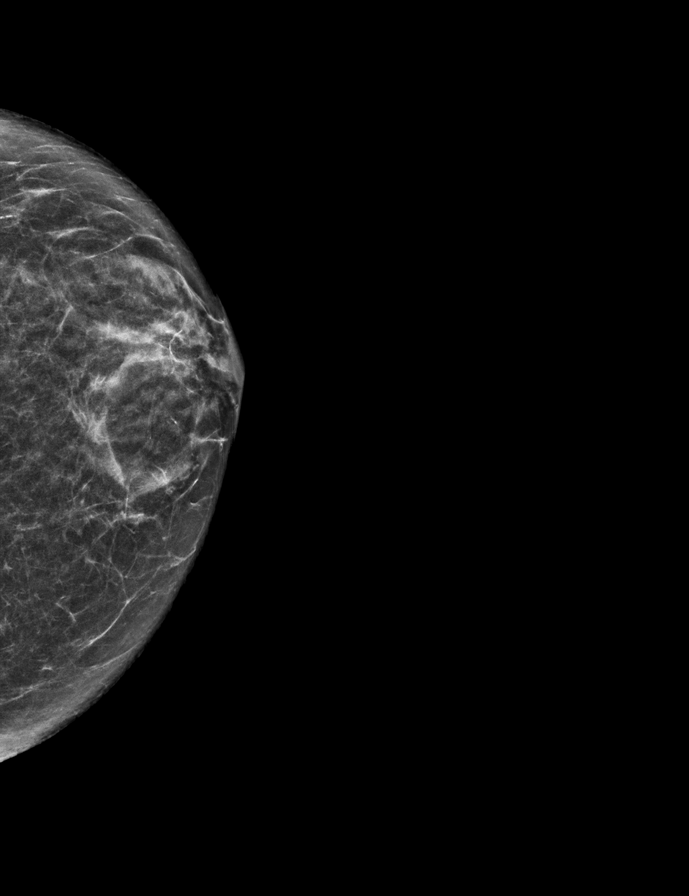

[R CC synth-2D]
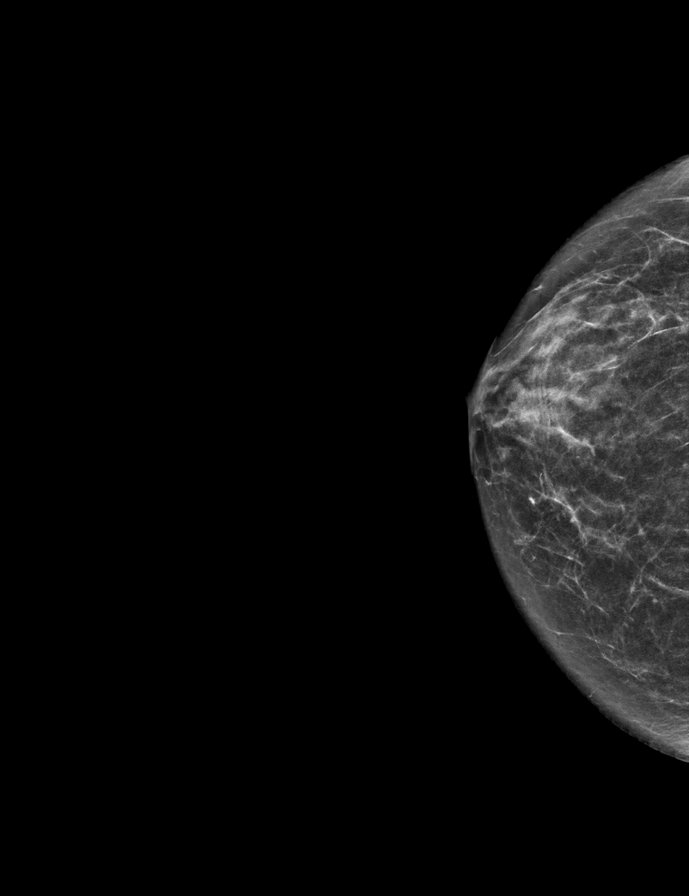

[L MLO synth-2D]
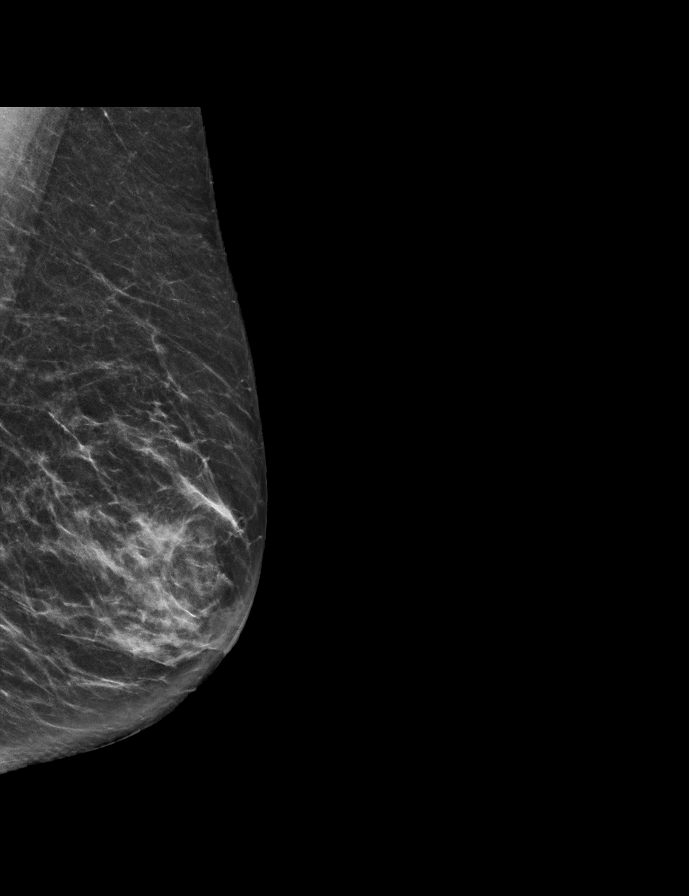

[R MLO synth-2D]
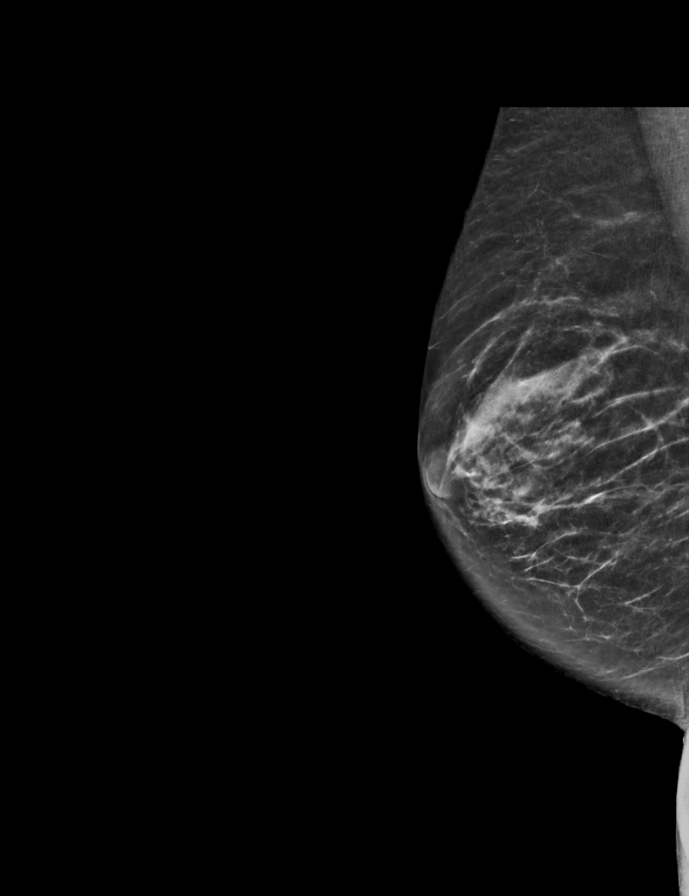

[R CC tomo · 2 of 54 frames shown]
[frame 18/54]
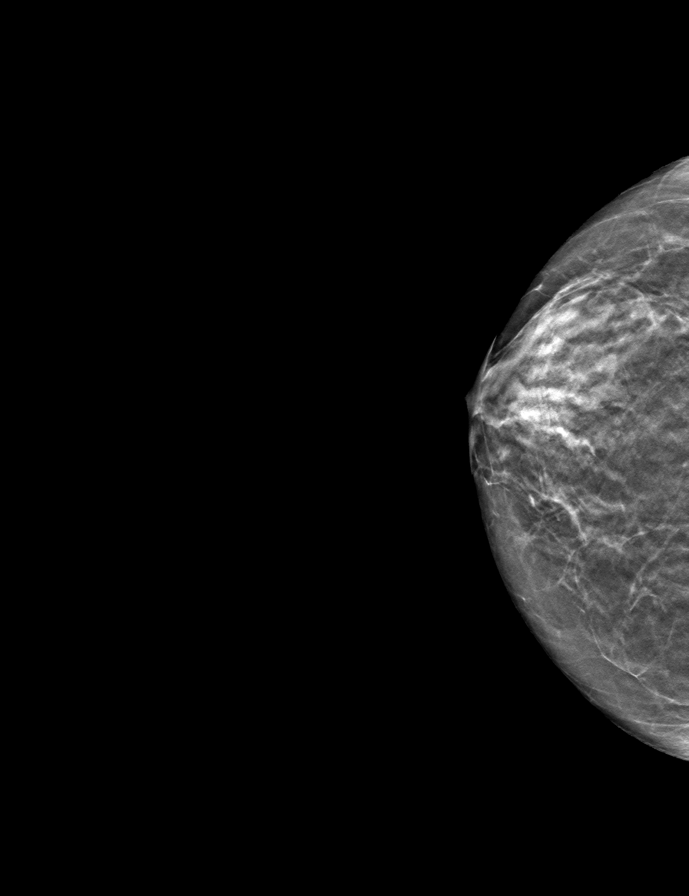
[frame 27/54]
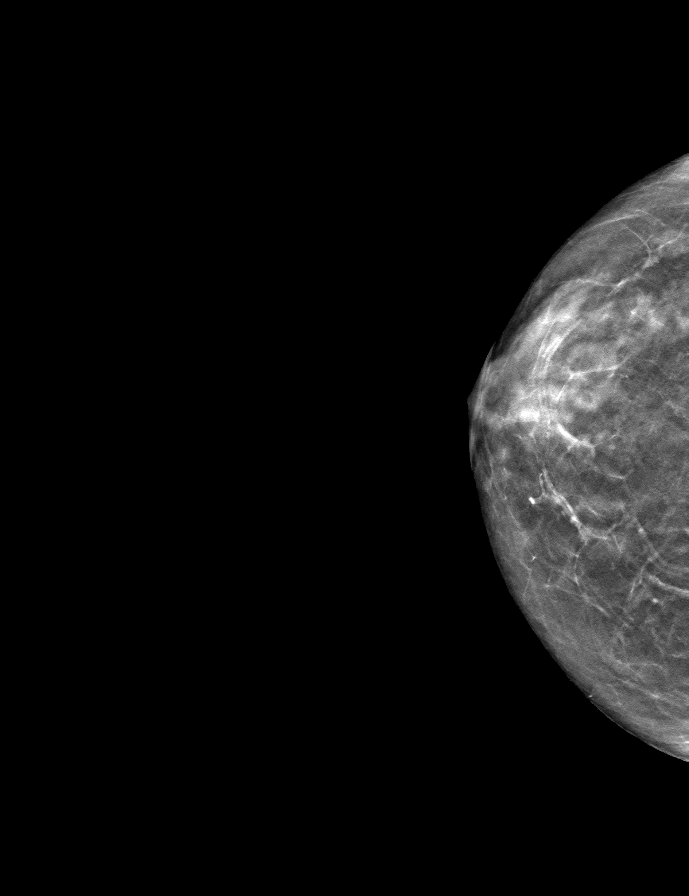

[L CC tomo · tomo slice 27/52.0]
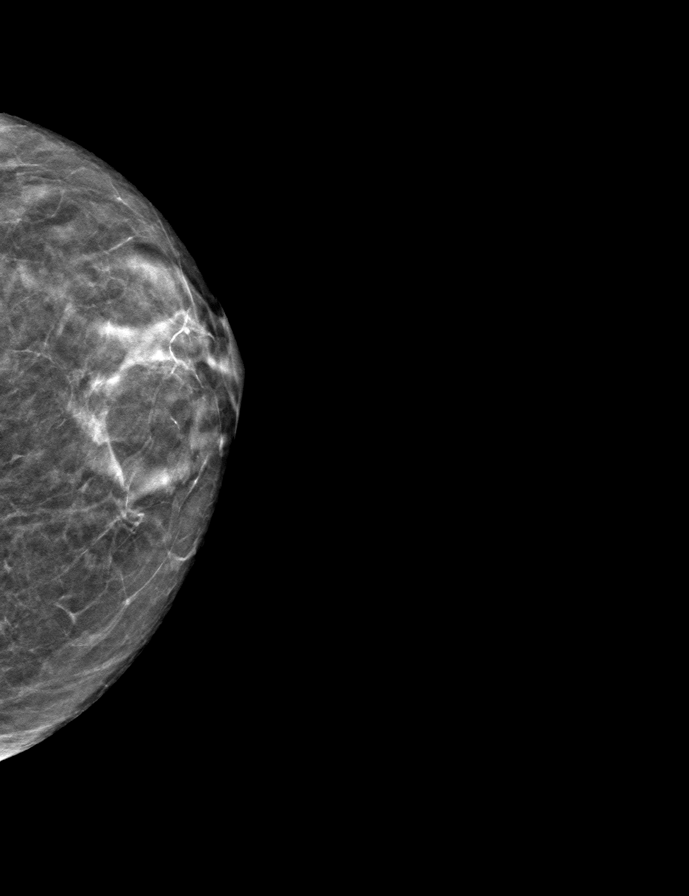

[L MLO tomo · tomo slice 32/63.0]
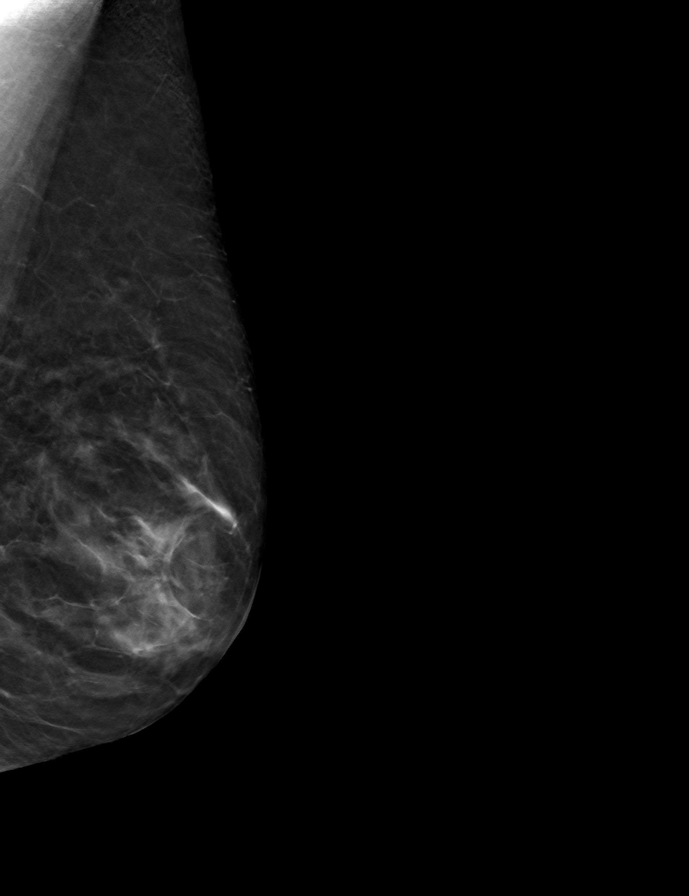

[R MLO tomo · tomo slice 34/67.0]
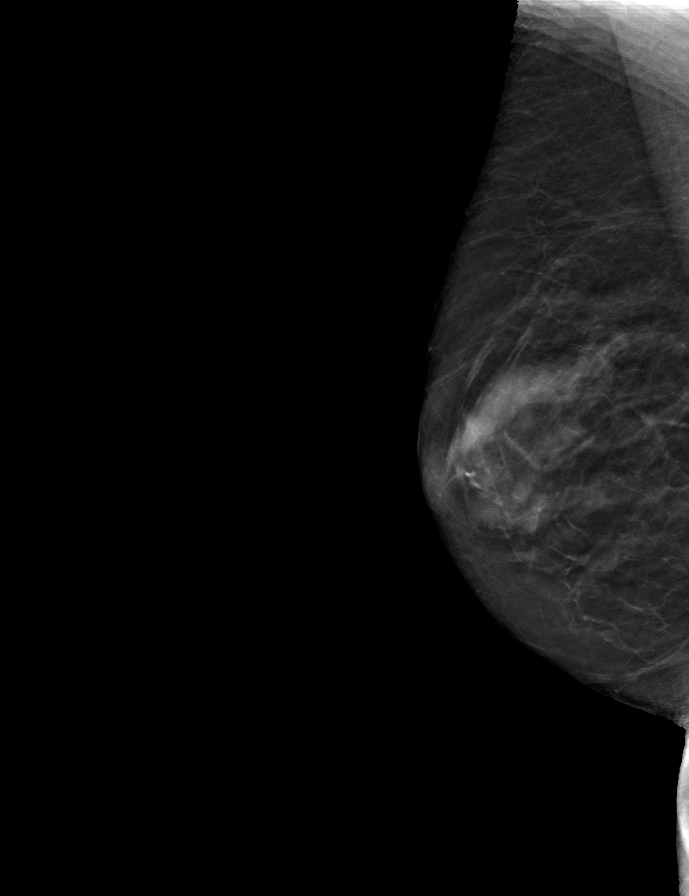

[9 of 24 positions shown; findings below may reference images not displayed]

ACR Breast Density Category b: There are scattered areas of
fibroglandular density.
FINDINGS: There are no findings suspicious for malignancy.
IMPRESSION: No mammographic evidence of malignancy. A result letter of this
screening mammogram will be mailed directly to the patient.

RECOMMENDATION:
Screening mammogram in one year. (Code:XG-X-X7B)

BI-RADS CATEGORY  1: Negative.

## 2021-09-19 ENCOUNTER — Other Ambulatory Visit: Payer: Self-pay | Admitting: Orthopedic Surgery

## 2021-09-19 DIAGNOSIS — M545 Low back pain, unspecified: Secondary | ICD-10-CM

## 2021-09-19 DIAGNOSIS — M546 Pain in thoracic spine: Secondary | ICD-10-CM

## 2021-10-01 ENCOUNTER — Other Ambulatory Visit: Payer: Self-pay | Admitting: *Deleted

## 2021-10-01 DIAGNOSIS — I712 Thoracic aortic aneurysm, without rupture, unspecified: Secondary | ICD-10-CM

## 2021-10-02 ENCOUNTER — Other Ambulatory Visit: Payer: Self-pay | Admitting: Family

## 2021-10-02 DIAGNOSIS — E2839 Other primary ovarian failure: Secondary | ICD-10-CM

## 2021-10-09 ENCOUNTER — Other Ambulatory Visit: Payer: Medicare Other

## 2021-10-10 ENCOUNTER — Ambulatory Visit
Admission: RE | Admit: 2021-10-10 | Discharge: 2021-10-10 | Disposition: A | Discharge status: Home / Self Care | Payer: Medicare Other | Source: Ambulatory Visit | Attending: Orthopedic Surgery | Admitting: Orthopedic Surgery

## 2021-10-10 ENCOUNTER — Other Ambulatory Visit: Payer: Self-pay | Admitting: Internal Medicine

## 2021-10-10 ENCOUNTER — Ambulatory Visit
Admission: RE | Admit: 2021-10-10 | Discharge: 2021-10-10 | Disposition: A | Discharge status: Home / Self Care | Payer: Medicare Other | Source: Ambulatory Visit | Attending: Internal Medicine | Admitting: Internal Medicine

## 2021-10-10 DIAGNOSIS — M545 Low back pain, unspecified: Secondary | ICD-10-CM

## 2021-10-10 DIAGNOSIS — M546 Pain in thoracic spine: Secondary | ICD-10-CM

## 2021-10-10 DIAGNOSIS — R059 Cough, unspecified: Secondary | ICD-10-CM

## 2021-10-23 ENCOUNTER — Ambulatory Visit: Payer: Medicare Other | Admitting: Physician Assistant

## 2021-11-05 IMAGING — CT CT HEART MORP W/ CTA COR W/ SCORE W/ CA W/CM &/OR W/O CM
4 of 7 series · 8 of 20 positions shown, 9 images · IV contrast (APPLIED)
Comparison: CTA chest 05/06/2021

Addendum:
EXAM:
Cardiac/Coronary  CT
TECHNIQUE: The patient was scanned on a [employer] Force scanner.

[Series 6: best syst · axial · 0.31mm/px · z∈[+1074,+1120]mm · 2 of 345 slices shown, 3 images]
[im 115/345  vessel]
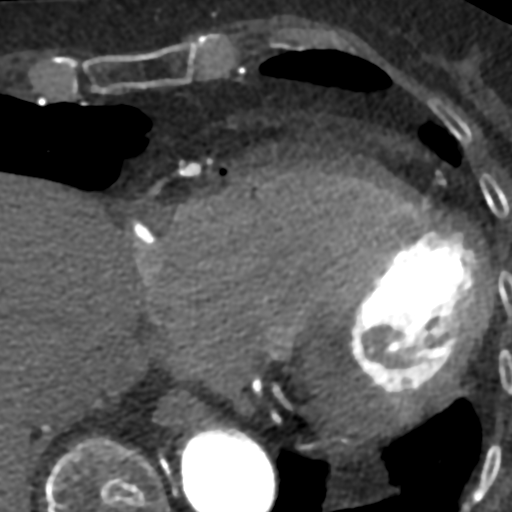
[im 115/345  lung]
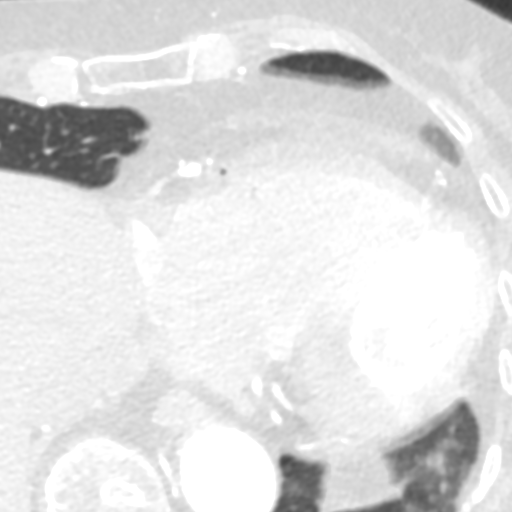
[im 230/345  vessel]
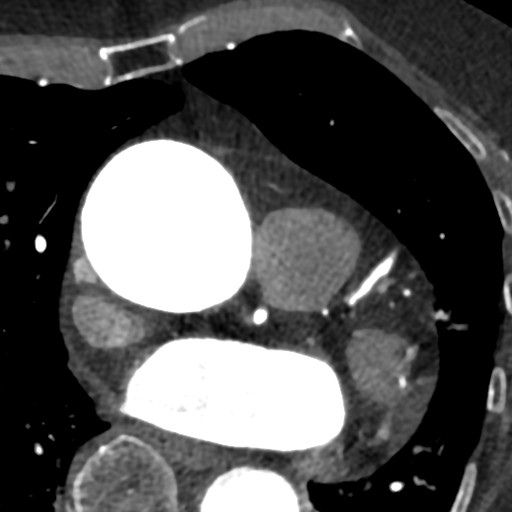

[Series 7: best diast 71 % · axial · 0.31mm/px · z∈[+1081,+1133]mm · 2 of 398 slices shown]
[im 133/398  vessel]
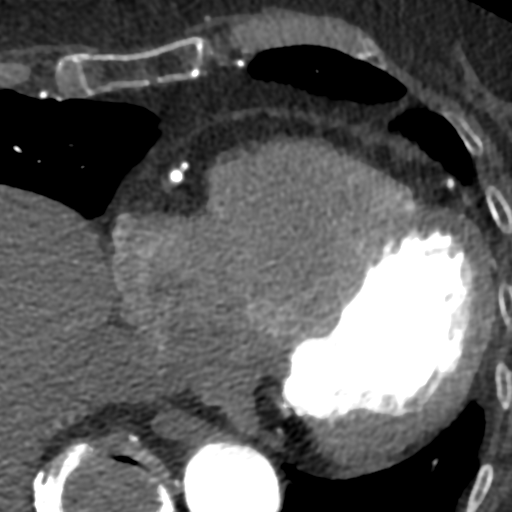
[im 265/398  vessel]
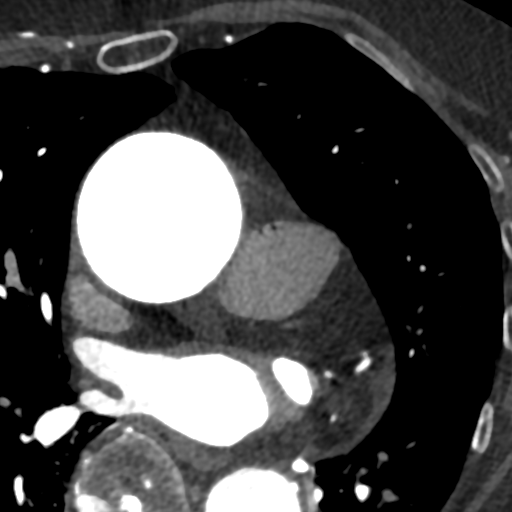

[Series 8: ts diast 71 % · axial · 0.31mm/px · z∈[+1081,+1133]mm · 2 of 398 slices shown]
[im 133/398  vessel]
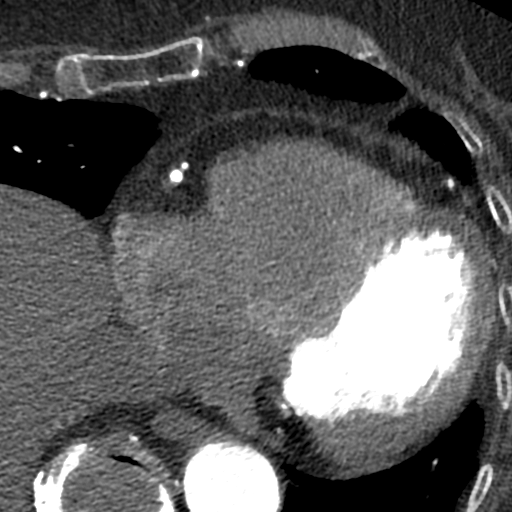
[im 265/398  vessel]
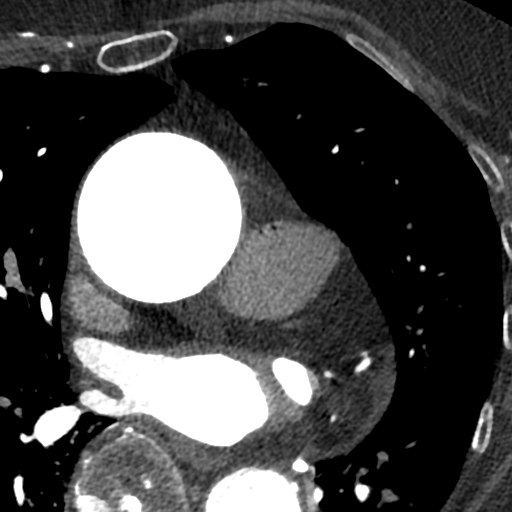

[Series 9: ts syst 35 % · axial · 0.31mm/px · z∈[+1081,+1133]mm · 2 of 398 slices shown]
[im 133/398  vessel]
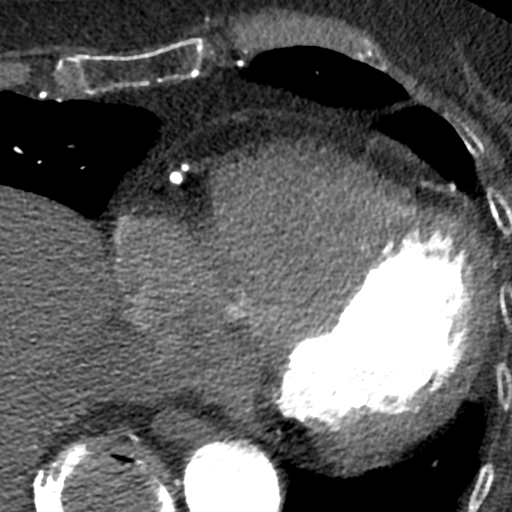
[im 265/398  vessel]
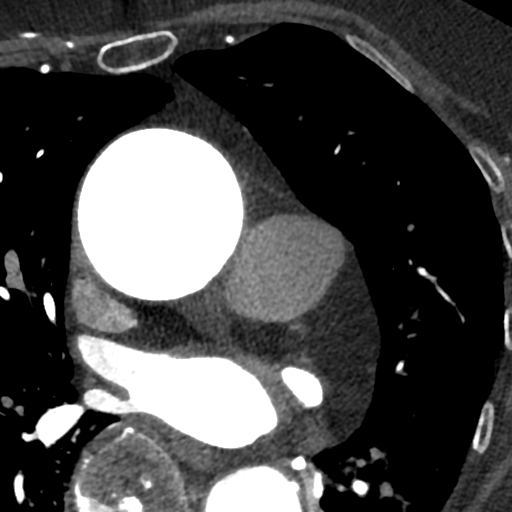

[8 of 20 positions shown; findings below may reference images not displayed]



Aorta: Moderately sized ascending aortic aneurysm measuring 49mm x
46mm in diameter at the level of the main PA bifurcation. No
calcifications. No dissection.

Aortic Valve:  Trileaflet.  No calcifications.

Coronary Arteries: Normal coronary origin with anterior takeoff of
RCA off the right coronary cusp. Right dominance.

RCA is a large dominant artery that originates anteriorly off the
right coronary cusp and gives rise to PDA and PLVB. There is minimal
calcified plaque in the proximal RCA with associated stenosis of
1-24%.

Left main is a large artery that gives rise to LAD and LCX arteries.
There is no plaque.

LAD is a large vessel that gives rise to D1, D2 and D3 vessels.
There is mild calcified plaque in the ostial and proximal LAD with
associated stenosis of 25-49%. There is minimal calcified plaque in
the mid LAD with associated stenosis of 1-24%.

LCX is a non-dominant artery that gives rise to one large branching
OM1 vessel. There is no plaque.

Other findings:

Normal pulmonary vein drainage into the left atrium.

Normal let atrial appendage without a thrombus.

Normal size of the pulmonary artery.
IMPRESSION: 1. Coronary calcium score of 319. This was 83rd percentile for age
and sex matched control.

2. Normal coronary origin with anterior takeoff of RCA off the right
coronary cusp with right dominance.

3.  Mild atherosclerosis.  CAD RADS 2.

4.  Recommend preventive therapy and risk factor modification.

Sachiko Lepe

EXAM:
OVER-READ INTERPRETATION  CT CHEST

The following report is an over-read performed by radiologist Dr.
Shegideb Orary [[email or web address]] on 05/21/2021. This over-read
does not include interpretation of cardiac or coronary anatomy or
pathology. The coronary CTA interpretation by the cardiologist is
attached.
FINDINGS: The visualized portions of the lower lung fields show no suspicious
nodules, masses, or infiltrates. No pleural fluid seen.Stable
atelectasis or scarring in the lingula. Calcified granuloma noted
right lower lobe.

Ascending thoracic aortic aneurysm characterized in the cardiac
section of the report. The remaining visualized portions of the
mediastinum and chest wall are unremarkable.
IMPRESSION: No significant non-cardiovascular abnormality seen in visualized
portion of the thorax.

*** End of Addendum ***
EXAM:
Cardiac/Coronary  CT
FINDINGS: A 120 kV prospective scan was triggered in the descending thoracic
aorta at 111 HU's. Axial non-contrast 3 mm slices were carried out
through the heart. The data set was analyzed on a dedicated work
station and scored using the Agatson method. Gantry rotation speed
was 250 msecs and collimation was .6 mm. No beta blockade and 0.8 mg
of sl NTG was given. The 3D data set was reconstructed in 5%
intervals of the 67-82 % of the R-R cycle. Diastolic phases were
analyzed on a dedicated work station using MPR, MIP and VRT modes.
The patient received 80 cc of contrast.

Aorta: Moderately sized ascending aortic aneurysm measuring 49mm x
46mm in diameter at the level of the main PA bifurcation. No
calcifications. No dissection.

Aortic Valve:  Trileaflet.  No calcifications.

Coronary Arteries: Normal coronary origin with anterior takeoff of
RCA off the right coronary cusp. Right dominance.

RCA is a large dominant artery that originates anteriorly off the
right coronary cusp and gives rise to PDA and PLVB. There is minimal
calcified plaque in the proximal RCA with associated stenosis of
1-24%.

Left main is a large artery that gives rise to LAD and LCX arteries.
There is no plaque.

LAD is a large vessel that gives rise to D1, D2 and D3 vessels.
There is mild calcified plaque in the ostial and proximal LAD with
associated stenosis of 25-49%. There is minimal calcified plaque in
the mid LAD with associated stenosis of 1-24%.

LCX is a non-dominant artery that gives rise to one large branching
OM1 vessel. There is no plaque.

Other findings:

Normal pulmonary vein drainage into the left atrium.

Normal let atrial appendage without a thrombus.

Normal size of the pulmonary artery.
IMPRESSION: 1. Coronary calcium score of 319. This was 83rd percentile for age
and sex matched control.

2. Normal coronary origin with anterior takeoff of RCA off the right
coronary cusp with right dominance.

3.  Mild atherosclerosis.  CAD RADS 2.

4.  Recommend preventive therapy and risk factor modification.

Sachiko Lepe

## 2021-11-12 ENCOUNTER — Encounter: Payer: Self-pay | Admitting: Surgery

## 2021-11-12 ENCOUNTER — Ambulatory Visit
Admission: RE | Admit: 2021-11-12 | Discharge: 2021-11-12 | Disposition: A | Discharge status: Home / Self Care | Payer: Medicare Other | Source: Ambulatory Visit | Attending: Surgery | Admitting: Surgery

## 2021-11-12 ENCOUNTER — Ambulatory Visit (INDEPENDENT_AMBULATORY_CARE_PROVIDER_SITE_OTHER): Payer: Medicare Other | Admitting: Surgery

## 2021-11-12 ENCOUNTER — Other Ambulatory Visit: Payer: Self-pay

## 2021-11-12 VITALS — BP 144/77 | HR 98 | Resp 20 | Ht 65.0 in | Wt 114.0 lb

## 2021-11-12 DIAGNOSIS — I712 Thoracic aortic aneurysm, without rupture, unspecified: Secondary | ICD-10-CM

## 2021-11-12 DIAGNOSIS — I7121 Aneurysm of the ascending aorta, without rupture: Secondary | ICD-10-CM

## 2021-11-12 MED ORDER — IOPAMIDOL (ISOVUE-370) INJECTION 76%
75.0000 mL | Freq: Once | INTRAVENOUS | Status: AC | PRN
  Administered 2021-11-12: 75 mL via INTRAVENOUS

## 2021-11-12 NOTE — Progress Notes (Signed)
Cardiothoracic Surgery Consultation  PCP is Aida Puffer, MD Referring Provider is Robinson, [place] N, PA-C  Chief Complaint  Patient presents with   Thoracic Aortic Aneurysm    F/u with CTA Chest    HPI:  The patient is a 76 year old woman with a history of asthma, migraines, and recurrent sinus infections who was previously evaluated by Dr. Dorris Fetch for a 5.3 to 5.4 cm ascending aortic aneurysm.  She had an echocardiogram that showed ejection fraction of 55% with a trileaflet aortic valve with mild insufficiency and no evidence of stenosis.  Surgical repair of her aneurysm was recommended and she was referred to cardiology for possible catheterization which she refused.  She underwent a gated coronary CTA which showed normal coronary origin with no significant stenosis noted.  She was last seen by Dr. Dorris Fetch on 05/27/2021 and refused surgery at that time.  Plans were made to have her return in 6 months with a repeat CTA of the chest.  She return to see me to get a second opinion.  She is here today with her sister and her sister's sister-in-law.  They report that she recently suffered a spinal compression fracture and has been significantly disabled by the due to pain.  She has not been able to walk much and requires assistance to get up.  She has not been eating well. Past Medical History:  Diagnosis Date   Chronic headaches    Menopausal state    Migraines    Recurrent sinus infections     Past Surgical History:  Procedure Laterality Date   DILATION AND CURETTAGE OF UTERUS     abnl cells   TONSILLECTOMY  1956    Family History  Problem Relation Age of Onset   Alzheimer's disease Mother    Heart attack Mother    Heart attack Father    Heart disease Brother     Social History Social History   Tobacco Use   Smoking status: Never   Smokeless tobacco: Never  Substance Use Topics   Alcohol use: Yes    Comment: ocass   Drug use: Never    Current Outpatient  Medications  Medication Sig Dispense Refill   acetaminophen (TYLENOL) 500 MG tablet Take 1,000 mg by mouth every 6 (six) hours as needed for mild pain or headache.     albuterol (VENTOLIN HFA) 108 (90 Base) MCG/ACT inhaler Inhale into the lungs every 6 (six) hours as needed for wheezing or shortness of breath.     ibuprofen (ADVIL) 200 MG tablet Take 400 mg by mouth every 6 (six) hours as needed for mild pain.     loratadine (CLARITIN) 10 MG tablet Take 10 mg by mouth daily as needed for allergies.     nitrofurantoin, macrocrystal-monohydrate, (MACROBID) 100 MG capsule Take 100 mg by mouth 2 (two) times daily.     Probiotic Product (PROBIOTIC DAILY PO) Take 1 capsule by mouth at bedtime.     metoprolol tartrate (LOPRESSOR) 100 MG tablet Take 1 tablet (100 mg) TWO hours prior to CT scan 1 tablet 0   No current facility-administered medications for this visit.    Allergies  Allergen Reactions   Penicillins Shortness Of Breath   Quinolones Other (See Comments)    Aneurysm   Sulfamethoxazole Hives    Review of Systems  Constitutional:  Positive for activity change, appetite change and fatigue. Negative for chills and fever.  HENT: Negative.    Eyes: Negative.   Respiratory:  Negative for chest  tightness and shortness of breath.   Cardiovascular:  Negative for chest pain and leg swelling.  Gastrointestinal: Negative.   Endocrine: Negative.   Genitourinary:  Positive for dysuria.       Currently being treated for UTI  Musculoskeletal:  Positive for back pain.  Skin: Negative.   Allergic/Immunologic: Negative.   Neurological:  Negative for dizziness and syncope.  Hematological: Negative.   Psychiatric/Behavioral: Negative.     BP (!) 144/77    Pulse 98    Resp 20    Ht 5\' 5"  (1.651 m)    Wt 114 lb (51.7 kg)    SpO2 96% Comment: RA   BMI 18.97 kg/m  Physical Exam Constitutional:      Comments: Chronically ill and frail appearing woman in no distress sitting in a wheelchair.  HENT:      Head: Normocephalic and atraumatic.  Eyes:     Extraocular Movements: Extraocular movements intact.     Pupils: Pupils are equal, round, and reactive to light.  Neck:     Vascular: No carotid bruit.  Cardiovascular:     Rate and Rhythm: Normal rate and regular rhythm.     Heart sounds: Normal heart sounds. No murmur heard. Pulmonary:     Effort: Pulmonary effort is normal.     Breath sounds: Normal breath sounds.  Abdominal:     General: Abdomen is flat.     Palpations: Abdomen is soft.     Tenderness: There is no abdominal tenderness.  Musculoskeletal:        General: No swelling.     Cervical back: Normal range of motion.  Skin:    General: Skin is warm and dry.  Neurological:     General: No focal deficit present.     Mental Status: She is oriented to person, place, and time.     Diagnostic Tests:  Narrative & Impression  CLINICAL DATA:  Follow-up aortic aneurysm.   EXAM: CT ANGIOGRAPHY CHEST WITH CONTRAST   TECHNIQUE: Multidetector CT imaging of the chest was performed using the standard protocol during bolus administration of intravenous contrast. Multiplanar CT image reconstructions and MIPs were obtained to evaluate the vascular anatomy.   RADIATION DOSE REDUCTION: This exam was performed according to the departmental dose-optimization program which includes automated exposure control, adjustment of the mA and/or kV according to patient size and/or use of iterative reconstruction technique.   CONTRAST:  33mL ISOVUE-370 IOPAMIDOL (ISOVUE-370) INJECTION 76%   COMPARISON:  CT 05/06/2021   FINDINGS: Cardiovascular: Ascending thoracic aorta measures 5.2 cm in axial dimension at the level of the pulmonary outflow track compared to 5.3 cm on comparison exam. Diameter measured at same level and orientation. Descending thoracic aorta normal caliber. Great vessels normal.   Mediastinum/Nodes: No axillary or supraclavicular adenopathy. No mediastinal or  hilar adenopathy. No pericardial fluid. Esophagus normal.   Lungs/Pleura: Mild interstitial edema pattern. Thickening along the RIGHT oblique fissure. Trace effusions. Airspace disease   Upper Abdomen: Limited view of the liver, kidneys, pancreas are unremarkable. Normal adrenal glands.   Musculoskeletal: No aggressive osseous lesion.   Review of the MIP images confirms the above findings.   IMPRESSION: 1. 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   Recommend semi-annual imaging followup by CTA or MRA and referral to cardiothoracic surgery if not already obtained. This recommendation follows 2010 ACCF/AHA/AATS/ACR/ASA/SCA/SCAI/SIR/STS/SVM Guidelines for the Diagnosis and Management of Patients With Thoracic Aortic Disease. Circulation. 2010; 121: C883-D744. Aortic aneurysm NOS (ICD10-I71.9)   2.  Mild  interstitial edema pattern in the lungs.     Electronically Signed   By: Genevive Bi M.D.   On: 11/12/2021 14:02   Impression:  She has a stable 5.2 to 5.3 cm fusiform ascending aortic aneurysm that extends out into the aortic arch.  The aorta does not return to normal until just beyond the left subclavian artery.  Her descending thoracic aorta at the same level measures about 3 cm.  She has a trileaflet aortic valve on echocardiogram.  Her aortic aneurysm is still below the surgical threshold of 5.5 cm and she certainly is in no condition to undergo replacement of her ascending aorta and aortic arch under deep hypothermic circulatory arrest given her age, recent spinal compression fractures with poor mobility, and likely chronic malnutrition with an albumin of 2.5 in 2021 and recent decreased appetite and weight loss.  I think it would be best to continue following this and I have recommended a repeat CTA of the chest in 6 months.  I stressed the importance of good blood pressure control in preventing further enlargement and acute aortic dissection.  I reviewed  the CTA images with the patient and her sister and answered all their questions.  Plan:  She will have a CTA of the chest and return to see me in 6 months.  I spent 45 minutes performing this consultation and > 50% of this time was spent face to face counseling and coordinating the care of this patient's ascending aorta and aortic arch aneurysm.   Alleen Borne, MD [facility] [phone]

## 2021-11-13 ENCOUNTER — Encounter (HOSPITAL_COMMUNITY): Payer: Self-pay

## 2021-11-13 ENCOUNTER — Emergency Department (HOSPITAL_COMMUNITY): Payer: Medicare Other

## 2021-11-13 ENCOUNTER — Other Ambulatory Visit: Payer: Self-pay

## 2021-11-13 ENCOUNTER — Inpatient Hospital Stay (HOSPITAL_COMMUNITY)
Admission: EM | Admit: 2021-11-13 | Discharge: 2021-11-17 | DRG: 194 | Disposition: A | Discharge status: Skilled Nursing Facility | Payer: Medicare Other | Attending: Internal Medicine | Admitting: Internal Medicine

## 2021-11-13 DIAGNOSIS — E860 Dehydration: Secondary | ICD-10-CM | POA: Diagnosis present

## 2021-11-13 DIAGNOSIS — Z880 Allergy status to penicillin: Secondary | ICD-10-CM | POA: Diagnosis not present

## 2021-11-13 DIAGNOSIS — Z20822 Contact with and (suspected) exposure to covid-19: Secondary | ICD-10-CM | POA: Diagnosis present

## 2021-11-13 DIAGNOSIS — F0390 Unspecified dementia without behavioral disturbance: Secondary | ICD-10-CM | POA: Diagnosis present

## 2021-11-13 DIAGNOSIS — S0083XA Contusion of other part of head, initial encounter: Principal | ICD-10-CM

## 2021-11-13 DIAGNOSIS — R509 Fever, unspecified: Secondary | ICD-10-CM | POA: Diagnosis present

## 2021-11-13 DIAGNOSIS — Z780 Asymptomatic menopausal state: Secondary | ICD-10-CM | POA: Diagnosis not present

## 2021-11-13 DIAGNOSIS — E876 Hypokalemia: Secondary | ICD-10-CM

## 2021-11-13 DIAGNOSIS — J189 Pneumonia, unspecified organism: Principal | ICD-10-CM | POA: Diagnosis present

## 2021-11-13 DIAGNOSIS — W07XXXA Fall from chair, initial encounter: Secondary | ICD-10-CM | POA: Diagnosis present

## 2021-11-13 DIAGNOSIS — N390 Urinary tract infection, site not specified: Secondary | ICD-10-CM | POA: Diagnosis present

## 2021-11-13 DIAGNOSIS — Z881 Allergy status to other antibiotic agents status: Secondary | ICD-10-CM | POA: Diagnosis not present

## 2021-11-13 DIAGNOSIS — S0093XA Contusion of unspecified part of head, initial encounter: Secondary | ICD-10-CM

## 2021-11-13 DIAGNOSIS — Z8249 Family history of ischemic heart disease and other diseases of the circulatory system: Secondary | ICD-10-CM

## 2021-11-13 DIAGNOSIS — Y92009 Unspecified place in unspecified non-institutional (private) residence as the place of occurrence of the external cause: Secondary | ICD-10-CM | POA: Diagnosis not present

## 2021-11-13 DIAGNOSIS — K5900 Constipation, unspecified: Secondary | ICD-10-CM | POA: Diagnosis present

## 2021-11-13 DIAGNOSIS — Z820 Family history of epilepsy and other diseases of the nervous system: Secondary | ICD-10-CM | POA: Diagnosis not present

## 2021-11-13 DIAGNOSIS — R9431 Abnormal electrocardiogram [ECG] [EKG]: Secondary | ICD-10-CM

## 2021-11-13 DIAGNOSIS — G43909 Migraine, unspecified, not intractable, without status migrainosus: Secondary | ICD-10-CM | POA: Diagnosis present

## 2021-11-13 DIAGNOSIS — Z79899 Other long term (current) drug therapy: Secondary | ICD-10-CM

## 2021-11-13 DIAGNOSIS — Z882 Allergy status to sulfonamides status: Secondary | ICD-10-CM | POA: Diagnosis not present

## 2021-11-13 DIAGNOSIS — R030 Elevated blood-pressure reading, without diagnosis of hypertension: Secondary | ICD-10-CM | POA: Diagnosis present

## 2021-11-13 LAB — CBC WITH DIFFERENTIAL/PLATELET
Abs Immature Granulocytes: 0.07 10*3/uL (ref 0.00–0.07)
Basophils Absolute: 0 10*3/uL (ref 0.0–0.1)
Basophils Relative: 0 %
Eosinophils Absolute: 1.1 10*3/uL — ABNORMAL HIGH (ref 0.0–0.5)
Eosinophils Relative: 9 %
HCT: 36.4 % (ref 36.0–46.0)
Hemoglobin: 11.5 g/dL — ABNORMAL LOW (ref 12.0–15.0)
Immature Granulocytes: 1 %
Lymphocytes Relative: 8 %
Lymphs Abs: 1 10*3/uL (ref 0.7–4.0)
MCH: 29.4 pg (ref 26.0–34.0)
MCHC: 31.6 g/dL (ref 30.0–36.0)
MCV: 93.1 fL (ref 80.0–100.0)
Monocytes Absolute: 0.8 10*3/uL (ref 0.1–1.0)
Monocytes Relative: 7 %
Neutro Abs: 9.2 10*3/uL — ABNORMAL HIGH (ref 1.7–7.7)
Neutrophils Relative %: 75 %
Platelets: 462 10*3/uL — ABNORMAL HIGH (ref 150–400)
RBC: 3.91 MIL/uL (ref 3.87–5.11)
RDW: 14.6 % (ref 11.5–15.5)
WBC: 12.3 10*3/uL — ABNORMAL HIGH (ref 4.0–10.5)
nRBC: 0 % (ref 0.0–0.2)

## 2021-11-13 LAB — COMPREHENSIVE METABOLIC PANEL
ALT: 10 U/L (ref 0–44)
AST: 17 U/L (ref 15–41)
Albumin: 2.9 g/dL — ABNORMAL LOW (ref 3.5–5.0)
Alkaline Phosphatase: 95 U/L (ref 38–126)
Anion gap: 10 (ref 5–15)
BUN: 7 mg/dL — ABNORMAL LOW (ref 8–23)
CO2: 25 mmol/L (ref 22–32)
Calcium: 9.1 mg/dL (ref 8.9–10.3)
Chloride: 102 mmol/L (ref 98–111)
Creatinine, Ser: 0.71 mg/dL (ref 0.44–1.00)
GFR, Estimated: >60 mL/min (ref >60)
Glucose, Bld: 119 mg/dL — ABNORMAL HIGH (ref 70–99)
Potassium: 2.9 mmol/L — ABNORMAL LOW (ref 3.5–5.1)
Sodium: 137 mmol/L (ref 135–145)
Total Bilirubin: 0.6 mg/dL (ref 0.3–1.2)
Total Protein: 6.6 g/dL (ref 6.5–8.1)

## 2021-11-13 LAB — URINALYSIS, ROUTINE W REFLEX MICROSCOPIC
Bilirubin Urine: NEGATIVE
Glucose, UA: NEGATIVE mg/dL
Hgb urine dipstick: NEGATIVE
Ketones, ur: 5 mg/dL — AB
Leukocytes,Ua: NEGATIVE
Nitrite: NEGATIVE
Protein, ur: NEGATIVE mg/dL
Specific Gravity, Urine: 1.009 (ref 1.005–1.030)
pH: 7 (ref 5.0–8.0)

## 2021-11-13 LAB — RESP PANEL BY RT-PCR (FLU A&B, COVID) ARPGX2
Influenza A by PCR: NEGATIVE
Influenza B by PCR: NEGATIVE
SARS Coronavirus 2 by RT PCR: NEGATIVE

## 2021-11-13 LAB — MAGNESIUM: Magnesium: 1.9 mg/dL (ref 1.7–2.4)

## 2021-11-13 LAB — LACTIC ACID, PLASMA: Lactic Acid, Venous: 1.8 mmol/L (ref 0.5–1.9)

## 2021-11-13 LAB — BRAIN NATRIURETIC PEPTIDE: B Natriuretic Peptide: 332.1 pg/mL — ABNORMAL HIGH (ref 0.0–100.0)

## 2021-11-13 MED ORDER — SODIUM CHLORIDE 0.9 % IV SOLN
100.0000 mg | Freq: Two times a day (BID) | INTRAVENOUS | Status: DC
  Administered 2021-11-13 – 2021-11-15 (×4): 100 mg via INTRAVENOUS
  Filled 2021-11-13 (×4): qty 100

## 2021-11-13 MED ORDER — ACETAMINOPHEN 325 MG PO TABS: 650.0000 mg | ORAL_TABLET | Freq: Four times a day (QID) | ORAL | Status: DC | PRN

## 2021-11-13 MED ORDER — ALBUTEROL SULFATE (2.5 MG/3ML) 0.083% IN NEBU
2.5000 mg | INHALATION_SOLUTION | RESPIRATORY_TRACT | Status: DC | PRN
  Administered 2021-11-14 – 2021-11-16 (×3): 2.5 mg via RESPIRATORY_TRACT
  Filled 2021-11-13 (×3): qty 3

## 2021-11-13 MED ORDER — LACTATED RINGERS IV SOLN: INTRAVENOUS | Status: DC

## 2021-11-13 MED ORDER — SODIUM CHLORIDE 0.9 % IV SOLN
1.0000 g | Freq: Once | INTRAVENOUS | Status: AC
  Administered 2021-11-13: 1 g via INTRAVENOUS
  Filled 2021-11-13: qty 10

## 2021-11-13 MED ORDER — SODIUM CHLORIDE 0.9 % IV SOLN
1.0000 g | INTRAVENOUS | Status: DC
  Administered 2021-11-14 – 2021-11-16 (×3): 1 g via INTRAVENOUS
  Filled 2021-11-13 (×3): qty 10

## 2021-11-13 MED ORDER — ALBUTEROL SULFATE HFA 108 (90 BASE) MCG/ACT IN AERS: 2.0000 | INHALATION_SPRAY | RESPIRATORY_TRACT | Status: DC | PRN

## 2021-11-13 MED ORDER — LACTATED RINGERS IV BOLUS
1000.0000 mL | Freq: Once | INTRAVENOUS | Status: AC
  Administered 2021-11-13: 1000 mL via INTRAVENOUS

## 2021-11-13 MED ORDER — POTASSIUM CHLORIDE 10 MEQ/100ML IV SOLN
10.0000 meq | Freq: Once | INTRAVENOUS | Status: AC
  Administered 2021-11-13: 10 meq via INTRAVENOUS
  Filled 2021-11-13: qty 100

## 2021-11-13 MED ORDER — ACETAMINOPHEN 650 MG RE SUPP: 650.0000 mg | Freq: Four times a day (QID) | RECTAL | Status: DC | PRN

## 2021-11-13 MED ORDER — SENNOSIDES-DOCUSATE SODIUM 8.6-50 MG PO TABS: 1.0000 | ORAL_TABLET | Freq: Every evening | ORAL | Status: DC | PRN

## 2021-11-13 MED ORDER — POTASSIUM CHLORIDE CRYS ER 20 MEQ PO TBCR
40.0000 meq | EXTENDED_RELEASE_TABLET | Freq: Once | ORAL | Status: AC
  Administered 2021-11-13: 40 meq via ORAL
  Filled 2021-11-13: qty 2

## 2021-11-13 MED ORDER — SODIUM CHLORIDE 0.9 % IV SOLN: 500.0000 mg | Freq: Once | INTRAVENOUS | Status: DC

## 2021-11-13 MED ORDER — POTASSIUM CHLORIDE 10 MEQ/100ML IV SOLN
10.0000 meq | INTRAVENOUS | Status: AC
  Administered 2021-11-14 (×2): 10 meq via INTRAVENOUS
  Filled 2021-11-13 (×2): qty 100

## 2021-11-13 NOTE — ED Provider Notes (Signed)
[facility] EMERGENCY DEPARTMENT Provider Note   CSN: [id number] Arrival date & time: 11/13/21  1936  LEVEL 5 CAVEAT - DEMENTIA   History  Chief Complaint  Patient presents with   Colleen Parker is a 76 y.o. female.  HPI 76 year old female presents with concern for infection and fall.  History is primarily from the patient's sister over the phone as she is driving into the emergency department.  Patient was treated for a urinary tract infection starting last week and they were called today to say that the urine culture indicated she might need IV antibiotics.  However we do not have these results.  Patient spiked a fever up to 101 tonight.  Because of this sister was going to bring her into the hospital.  While getting her dressed the patient fell forward out of the chair and hit her head on the floor.  She did not lose consciousness.  Chronically is disoriented and has dementia but is not worse recently.  Home Medications Prior to Admission medications   Medication Sig Start Date End Date Taking? Authorizing Provider  acetaminophen (TYLENOL) 500 MG tablet Take 1,000 mg by mouth every 6 (six) hours as needed for mild pain or headache.    [provider]  albuterol (VENTOLIN HFA) 108 (90 Base) MCG/ACT inhaler Inhale into the lungs every 6 (six) hours as needed for wheezing or shortness of breath.    [provider]  ibuprofen (ADVIL) 200 MG tablet Take 400 mg by mouth every 6 (six) hours as needed for mild pain.    [provider]  loratadine (CLARITIN) 10 MG tablet Take 10 mg by mouth daily as needed for allergies.    [provider]  metoprolol tartrate (LOPRESSOR) 100 MG tablet Take 1 tablet (100 mg) TWO hours prior to CT scan 05/15/21   Little Ishikawa, MD  nitrofurantoin, macrocrystal-monohydrate, (MACROBID) 100 MG capsule Take 100 mg by mouth 2 (two) times daily. 08/09/21   [provider]  Probiotic Product  (PROBIOTIC DAILY PO) Take 1 capsule by mouth at bedtime.    [provider]      Allergies    Penicillins, Quinolones, and Sulfamethoxazole    Review of Systems   Review of Systems  Unable to perform ROS: Dementia   Physical Exam Updated Vital Signs BP (!) 165/85 (BP Location: Right Arm)    Pulse 85    Temp 98.3 F (36.8 C) (Oral)    Resp 18    SpO2 98%  Physical Exam Vitals and nursing note reviewed.  Constitutional:      Appearance: She is well-developed.  HENT:     Head: Normocephalic. Contusion present.   Eyes:     Extraocular Movements: Extraocular movements intact.     Pupils: Pupils are equal, round, and reactive to light.  Cardiovascular:     Rate and Rhythm: Regular rhythm. Tachycardia present.     Heart sounds: Normal heart sounds.  Pulmonary:     Effort: Pulmonary effort is normal.     Breath sounds: Normal breath sounds.  Abdominal:     General: There is no distension.     Palpations: Abdomen is soft.     Tenderness: There is no abdominal tenderness.  Musculoskeletal:     Cervical back: No spinous process tenderness or muscular tenderness.  Skin:    General: Skin is warm and dry.  Neurological:     Mental Status: She is alert. She is  disoriented.     Comments: Equal strength in all 4 extremities.  She is oriented to herself and being in the hospital but is disoriented to time.    ED Results / Procedures / Treatments   Labs (all labs ordered are listed, but only abnormal results are displayed) Labs Reviewed  URINALYSIS, ROUTINE W REFLEX MICROSCOPIC - Abnormal; Notable for the following components:      Result Value   Ketones, ur 5 (*)    All other components within normal limits  COMPREHENSIVE METABOLIC PANEL - Abnormal; Notable for the following components:   Potassium 2.9 (*)    Glucose, Bld 119 (*)    BUN 7 (*)    Albumin 2.9 (*)    All other components within normal limits  CBC WITH DIFFERENTIAL/PLATELET - Abnormal; Notable for the  following components:   WBC 12.3 (*)    Hemoglobin 11.5 (*)    Platelets 462 (*)    Neutro Abs 9.2 (*)    Eosinophils Absolute 1.1 (*)    All other components within normal limits  RESP PANEL BY RT-PCR (FLU A&B, COVID) [id number]  CULTURE, BLOOD (ROUTINE X 2)  CULTURE, BLOOD (ROUTINE X 2)  URINE CULTURE  LACTIC ACID, PLASMA  MAGNESIUM  BRAIN NATRIURETIC PEPTIDE  CBG MONITORING, ED    EKG EKG Interpretation  Date/Time:  Thursday November 13 2021 19:40:27 EST Ventricular Rate:  106 PR Interval:  84 QRS Duration: 107 QT Interval:  404 QTC Calculation: 537 R Axis:   -70 Text Interpretation: Sinus tachycardia Multiple ventricular premature complexes Left axis deviation Borderline repolarization abnormality Prolonged QT interval ST/T changes similar to June 2022 Confirmed by Pricilla Loveless [phone]) on 11/13/2021 8:41:56 PM  Radiology CT Head Wo Contrast  Result Date: 11/13/2021 CLINICAL DATA:  Trauma. EXAM: CT HEAD WITHOUT CONTRAST CT MAXILLOFACIAL WITHOUT CONTRAST CT CERVICAL SPINE WITHOUT CONTRAST TECHNIQUE: Multidetector CT imaging of the head, cervical spine, and maxillofacial structures were performed using the standard protocol without intravenous contrast. Multiplanar CT image reconstructions of the cervical spine and maxillofacial structures were also generated. RADIATION DOSE REDUCTION: This exam was performed according to the departmental dose-optimization program which includes automated exposure control, adjustment of the mA and/or kV according to patient size and/or use of iterative reconstruction technique. COMPARISON:  Head CT dated 07/22/2020. FINDINGS: CT HEAD FINDINGS Brain: Mild age-related atrophy and moderate chronic microvascular ischemic changes. No acute intracranial hemorrhage. No mass effect or midline shift. No extra-axial fluid collection. Vascular: No hyperdense vessel or unexpected calcification. Skull: Normal. Negative for fracture or focal lesion. Other: Contusion  over the forehead. CT MAXILLOFACIAL FINDINGS Osseous: No acute fracture.  No mandibular dislocation. Orbits: The globes and retro-orbital fat are preserved. Sinuses: Diffuse mucoperiosteal thickening of paranasal sinuses with partial opacification of the right maxillary and sphenoid sinuses with air-fluid level. The mastoid air cells are clear. Soft tissues: Soft tissue contusion over the forehead. CT CERVICAL SPINE FINDINGS Alignment: No acute subluxation. Skull base and vertebrae: No acute cervical spine fracture. Age indeterminate mild compression fracture of superior endplate of T4. Correlation with clinical exam and point tenderness recommended. Soft tissues and spinal canal: No prevertebral fluid or swelling. No visible canal hematoma. Disc levels:  No acute findings.  Degenerative changes. Upper chest: Negative. Other: Bilateral carotid bulb calcified plaques. IMPRESSION: 1. No acute intracranial pathology. 2. No acute facial bone fractures. 3. No acute fracture or subluxation of the cervical spine. 4. Age indeterminate mild compression fracture of superior endplate of T4. Correlation with clinical  exam and point tenderness recommended. Electronically Signed   By: Elgie CollardArash  Radparvar M.D.   On: 11/13/2021 22:07   CT Cervical Spine Wo Contrast  Result Date: 11/13/2021 CLINICAL DATA:  Trauma. EXAM: CT HEAD WITHOUT CONTRAST CT MAXILLOFACIAL WITHOUT CONTRAST CT CERVICAL SPINE WITHOUT CONTRAST TECHNIQUE: Multidetector CT imaging of the head, cervical spine, and maxillofacial structures were performed using the standard protocol without intravenous contrast. Multiplanar CT image reconstructions of the cervical spine and maxillofacial structures were also generated. RADIATION DOSE REDUCTION: This exam was performed according to the departmental dose-optimization program which includes automated exposure control, adjustment of the mA and/or kV according to patient size and/or use of iterative reconstruction  technique. COMPARISON:  Head CT dated 07/22/2020. FINDINGS: CT HEAD FINDINGS Brain: Mild age-related atrophy and moderate chronic microvascular ischemic changes. No acute intracranial hemorrhage. No mass effect or midline shift. No extra-axial fluid collection. Vascular: No hyperdense vessel or unexpected calcification. Skull: Normal. Negative for fracture or focal lesion. Other: Contusion over the forehead. CT MAXILLOFACIAL FINDINGS Osseous: No acute fracture.  No mandibular dislocation. Orbits: The globes and retro-orbital fat are preserved. Sinuses: Diffuse mucoperiosteal thickening of paranasal sinuses with partial opacification of the right maxillary and sphenoid sinuses with air-fluid level. The mastoid air cells are clear. Soft tissues: Soft tissue contusion over the forehead. CT CERVICAL SPINE FINDINGS Alignment: No acute subluxation. Skull base and vertebrae: No acute cervical spine fracture. Age indeterminate mild compression fracture of superior endplate of T4. Correlation with clinical exam and point tenderness recommended. Soft tissues and spinal canal: No prevertebral fluid or swelling. No visible canal hematoma. Disc levels:  No acute findings.  Degenerative changes. Upper chest: Negative. Other: Bilateral carotid bulb calcified plaques. IMPRESSION: 1. No acute intracranial pathology. 2. No acute facial bone fractures. 3. No acute fracture or subluxation of the cervical spine. 4. Age indeterminate mild compression fracture of superior endplate of T4. Correlation with clinical exam and point tenderness recommended. Electronically Signed   By: Elgie CollardArash  Radparvar M.D.   On: 11/13/2021 22:07   DG Chest Portable 1 View  Result Date: 11/13/2021 CLINICAL DATA:  Provided history: Fever. EXAM: PORTABLE CHEST 1 VIEW COMPARISON:  CT angiogram chest 11/12/2021 prior chest radiographs 10/10/2021 and earlier. FINDINGS: There is significant patient rotation to the right. Heart size at the upper limits of normal.  Aortic atherosclerosis. Mild prominence of the interstitial lung markings. No appreciable pleural effusion or evidence of pneumothorax. No acute bony abnormality identified. IMPRESSION: Mild prominence of the interstitial lung markings, which may reflect interstitial edema or possibly atypical/viral pneumonia. Aortic Atherosclerosis (ICD10-I70.0). Electronically Signed   By: Jackey LogeKyle  Golden D.O.   On: 11/13/2021 21:05   CT ANGIO CHEST AORTA W/CM & OR WO/CM  Result Date: 11/12/2021 CLINICAL DATA:  Follow-up aortic aneurysm. EXAM: CT ANGIOGRAPHY CHEST WITH CONTRAST TECHNIQUE: Multidetector CT imaging of the chest was performed using the standard protocol during bolus administration of intravenous contrast. Multiplanar CT image reconstructions and MIPs were obtained to evaluate the vascular anatomy. RADIATION DOSE REDUCTION: This exam was performed according to the departmental dose-optimization program which includes automated exposure control, adjustment of the mA and/or kV according to patient size and/or use of iterative reconstruction technique. CONTRAST:  75mL ISOVUE-370 IOPAMIDOL (ISOVUE-370) INJECTION 76% COMPARISON:  CT 05/06/2021 FINDINGS: Cardiovascular: Ascending thoracic aorta measures 5.2 cm in axial dimension at the level of the pulmonary outflow track compared to 5.3 cm on comparison exam. Diameter measured at same level and orientation. Descending thoracic aorta normal caliber. Great vessels normal.  Mediastinum/Nodes: No axillary or supraclavicular adenopathy. No mediastinal or hilar adenopathy. No pericardial fluid. Esophagus normal. Lungs/Pleura: Mild interstitial edema pattern. Thickening along the RIGHT oblique fissure. Trace effusions. Airspace disease Upper Abdomen: Limited view of the liver, kidneys, pancreas are unremarkable. Normal adrenal glands. Musculoskeletal: No aggressive osseous lesion. Review of the MIP images confirms the above findings. IMPRESSION: 1. 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 Recommend semi-annual imaging followup by CTA or MRA and referral to cardiothoracic surgery if not already obtained. This recommendation follows 2010 ACCF/AHA/AATS/ACR/ASA/SCA/SCAI/SIR/STS/SVM Guidelines for the Diagnosis and Management of Patients With Thoracic Aortic Disease. Circulation. 2010; 121: P710-G269. Aortic aneurysm NOS (ICD10-I71.9) 2.  Mild interstitial edema pattern in the lungs. Electronically Signed   By: Genevive Bi M.D.   On: 11/12/2021 14:02   CT Maxillofacial Wo Contrast  Result Date: 11/13/2021 CLINICAL DATA:  Trauma. EXAM: CT HEAD WITHOUT CONTRAST CT MAXILLOFACIAL WITHOUT CONTRAST CT CERVICAL SPINE WITHOUT CONTRAST TECHNIQUE: Multidetector CT imaging of the head, cervical spine, and maxillofacial structures were performed using the standard protocol without intravenous contrast. Multiplanar CT image reconstructions of the cervical spine and maxillofacial structures were also generated. RADIATION DOSE REDUCTION: This exam was performed according to the departmental dose-optimization program which includes automated exposure control, adjustment of the mA and/or kV according to patient size and/or use of iterative reconstruction technique. COMPARISON:  Head CT dated 07/22/2020. FINDINGS: CT HEAD FINDINGS Brain: Mild age-related atrophy and moderate chronic microvascular ischemic changes. No acute intracranial hemorrhage. No mass effect or midline shift. No extra-axial fluid collection. Vascular: No hyperdense vessel or unexpected calcification. Skull: Normal. Negative for fracture or focal lesion. Other: Contusion over the forehead. CT MAXILLOFACIAL FINDINGS Osseous: No acute fracture.  No mandibular dislocation. Orbits: The globes and retro-orbital fat are preserved. Sinuses: Diffuse mucoperiosteal thickening of paranasal sinuses with partial opacification of the right maxillary and sphenoid sinuses with air-fluid level. The mastoid air cells are clear.  Soft tissues: Soft tissue contusion over the forehead. CT CERVICAL SPINE FINDINGS Alignment: No acute subluxation. Skull base and vertebrae: No acute cervical spine fracture. Age indeterminate mild compression fracture of superior endplate of T4. Correlation with clinical exam and point tenderness recommended. Soft tissues and spinal canal: No prevertebral fluid or swelling. No visible canal hematoma. Disc levels:  No acute findings.  Degenerative changes. Upper chest: Negative. Other: Bilateral carotid bulb calcified plaques. IMPRESSION: 1. No acute intracranial pathology. 2. No acute facial bone fractures. 3. No acute fracture or subluxation of the cervical spine. 4. Age indeterminate mild compression fracture of superior endplate of T4. Correlation with clinical exam and point tenderness recommended. Electronically Signed   By: Elgie Collard M.D.   On: 11/13/2021 22:07    Procedures .Critical Care Performed by: Pricilla Loveless, MD Authorized by: Pricilla Loveless, MD   Critical care provider statement:    Critical care time (minutes):  30   Critical care time was exclusive of:  Separately billable procedures and treating other patients   Critical care was necessary to treat or prevent imminent or life-threatening deterioration of the following conditions:  Metabolic crisis   Critical care was time spent personally by me on the following activities:  Development of treatment plan with patient or surrogate, discussions with consultants, evaluation of patient's response to treatment, examination of patient, ordering and review of laboratory studies, ordering and review of radiographic studies, ordering and performing treatments and interventions, pulse oximetry, re-evaluation of patient's condition and review of old charts    Medications Ordered in  ED Medications  potassium chloride 10 mEq in 100 mL IVPB (10 mEq Intravenous New Bag/Given 11/13/21 2219)  cefTRIAXone (ROCEPHIN) 1 g in sodium chloride  0.9 % 100 mL IVPB (has no administration in time range)  doxycycline (VIBRAMYCIN) 100 mg in sodium chloride 0.9 % 250 mL IVPB (has no administration in time range)  lactated ringers bolus 1,000 mL (0 mLs Intravenous Stopped 11/13/21 2241)  potassium chloride SA (KLOR-CON M) CR tablet 40 mEq (40 mEq Oral Given 11/13/21 2220)    ED Course/ Medical Decision Making/ A&P                            Patient has multiple issues tonight.  Primary seems to be a prolonged QTc on the ECG along with hypokalemia.  Has some mild leukocytosis as well but no obvious sepsis, including no elevated lactate or hypotension.  There is questionable pneumonia on the x-ray, I personally reviewed these images.  Could be edema as well so we will add on BNP.  Otherwise we will start on IV antibiotics.  She will need IV and oral potassium replacement to help with the hypokalemia that seems to be causing a prolonged QTc.  From an injury perspective, head CT, face CT and neck CT are all benign.  At this point I discussed with the sister and we will keep her in the hospital for further management and care.  Discussed with Dr. Rachael Darbyhotiner.        Final Clinical Impression(s) / ED Diagnoses Final diagnoses:  Contusion of forehead, initial encounter  Hypokalemia  Prolonged Q-T interval on ECG    Rx / DC Orders ED Discharge Orders     None         Pricilla LovelessGoldston, Aleera Gilcrease, MD 11/13/21 2308

## 2021-11-13 NOTE — Assessment & Plan Note (Signed)
Chronic. 

## 2021-11-13 NOTE — H&P (Signed)
History and Physical    Patient: Colleen Parker DOB: 31-May-1946 DOA: 11/13/2021 DOS: the patient was seen and examined on 11/13/2021 PCP: Margretta Sidle, MD  Patient coming from: Home  Chief Complaint:  Chief Complaint  Patient presents with   Fall    HPI: Colleen Parker is a 76 y.o. female with medical history significant of compression fractures in spine, dementia and recent UTI treated by PCP.  She lives with her sister.  Tonight her PCP called the sister and told her the urine culture indicated that patient will need IV antibiotics and to bring her to the emergency room.  She was treated last week for UTI with Macrobid which she 2 days left in the prescription.  Sister reports that she had a temperature of 100.7 degrees earlier today.  She also had a cough that was nonproductive.  Colleen Parker has not been complaining of pain with urination or frequent urination since she started the antibiotic therapy.  She was getting dressed to come to the emergency room she fell and hit her head on the floor.  She did not have loss of consciousness then no seizure-like activity.  When she fell her sister called EMS and had her brought to the emergency room.  In the emergency room patient has been hemodynamically stable and pleasant.  Chest x-ray reveals an atypical pneumonia versus interstitial edema that is mild.  Had mildly elevated white blood cell count and a fever at home so she was started on antibiotic therapy.  She was also found to have low potassium level with a normal magnesium level. Hospitalist service asked to admit for further management  Review of Systems: unable to review all systems due to the dementia Past Medical History:  Diagnosis Date   Chronic headaches    Menopausal state    Migraines    Recurrent sinus infections    Past Surgical History:  Procedure Laterality Date   DILATION AND CURETTAGE OF UTERUS     abnl cells   TONSILLECTOMY  1956   Social History:  reports  that she has never smoked. She has never used smokeless tobacco. She reports current alcohol use. She reports that she does not use drugs.  Allergies  Allergen Reactions   Penicillins Shortness Of Breath   Quinolones Other (See Comments)    Aneurysm   Sulfamethoxazole Hives    Family History  Problem Relation Age of Onset   Alzheimer's disease Mother    Heart attack Mother    Heart attack Father    Heart disease Brother     Prior to Admission medications   Medication Sig Start Date End Date Taking? Authorizing Provider  acetaminophen (TYLENOL) 500 MG tablet Take 1,000 mg by mouth every 6 (six) hours as needed for mild pain or headache.    [provider]  albuterol (VENTOLIN HFA) 108 (90 Base) MCG/ACT inhaler Inhale into the lungs every 6 (six) hours as needed for wheezing or shortness of breath.    [provider]  ibuprofen (ADVIL) 200 MG tablet Take 400 mg by mouth every 6 (six) hours as needed for mild pain.    [provider]  loratadine (CLARITIN) 10 MG tablet Take 10 mg by mouth daily as needed for allergies.    [provider]  metoprolol tartrate (LOPRESSOR) 100 MG tablet Take 1 tablet (100 mg) TWO hours prior to CT scan 05/15/21   Donato Heinz, MD  nitrofurantoin, macrocrystal-monohydrate, (MACROBID) 100 MG capsule Take 100  mg by mouth 2 (two) times daily. 08/09/21   [provider]  Probiotic Product (PROBIOTIC DAILY PO) Take 1 capsule by mouth at bedtime.    [provider]    Physical Exam: Vitals:   11/13/21 2045 11/13/21 2100 11/13/21 2115 11/13/21 2145  BP: (!) 163/86 (!) 167/118 (!) 158/58 (!) 165/85  Pulse: 97 (!) 104 90 85  Resp: (!) 24 (!) 25 18 18   Temp:   98.3 F (36.8 C)   TempSrc:   Oral   SpO2: 98% 98% 97% 98%   General: WDWN, Alert and oriented to person and place.  Eyes: EOMI, PERRL, conjunctivae normal. Sclera nonicteric HENT:  [place], external ears normal.  Nares patent without  epistasis.  Mucous membranes are moist. Contusion and hematoma of forehead above right eye.  Neck: Soft, normal range of motion, supple, no masses,  Trachea midline Respiratory: Equal breath sounds.  Mild diffuse rales. no wheezing, no crackles. Normal respiratory effort.  Cardiovascular: Regular rate and rhythm, no murmurs / rubs / gallops. No extremity edema. 2+ pedal pulses. Abdomen: Soft, no tenderness, nondistended, no rebound or guarding.  No masses palpated. Bowel sounds normoactive Musculoskeletal: FROM. Kyphosis of upper spine. No cyanosis. No joint deformity upper and lower extremities. Normal muscle tone.  Skin: Warm, dry, intact no rashes, lesions, ulcers. No induration Neurologic: CN 2-12 grossly intact.  Normal speech.  Sensation intact to touch Psychiatric:  Normal mood.    Data Reviewed: Lab Work:   B12 thousand 300 hemoglobin 11.5 hematocrit 36.4 platelets 462,000 lactic acid 1.8 sodium 137 potassium 2.9 chloride 102 bicarb 25 creatinine 0.71 BUN 7 glucose 119 magnesium 1.9 albumin 2.9 alkaline phosphatase 95 AST 17 ALT 10 bilirubin 0.6       BNP pending COVID-negative     influenza A and B are negative  CT of the head cervical spine and maxillofacial shows no fractures or acute intracranial process  EKG shows sinus tachycardia with occasional PVCs.  No acute ST elevation or depression.  Prolonged QTc with a QTc of 537  Assessment and Plan: * CAP (community acquired pneumonia)- (present on admission) Colleen Parker has atypical pneumonia as evidenced on CXR. Possible interstitial edema so BNP ordered and pending.  Started on treatment with Rocephin and Doxycycline Supplemental oxygen to keep O2 sat between 92-96%.  Incentive spirometer every 2 hours while awake.   Will need to get urine culture results from [place] as sister reports PCP called her today and told her to bring Colleen Parker to ER based on culture result. UA negative in Er.  Hypokalemia Pt given PO and IV  potassium in the ER Magnesium level is normal  Prolonged QT interval Avoid medications which could further prolong QT interval  Dementia (HCC) Chronic.  Traumatic hematoma of head Fall at home tonight when changing clothes. Has hematoma over right eye. No LOC. CT head negative for acute intracranial bleed or fracture  Blood pressure elevated without history of HTN BP elevated in 140-168/58-115 range. Monitor BP. Start low dose lisinopril   Advance Care Planning:  Code Status:   Full code.  SCDs for DVT prophylaxis after fall at home with hematoma of forehead.  Family Communication: Diagnosis and plan discussed with patient and her sisters at bedside.  Patient agrees with plan.  Sister verbalized understanding agrees with plan.  Further recommendations to follow as clinically indicated  Author: Eben Burow, MD 11/13/2021 11:30 PM  For on call review [email or web address].

## 2021-11-13 NOTE — Assessment & Plan Note (Signed)
Avoid medications which could further prolong QT interval.  ?

## 2021-11-13 NOTE — Assessment & Plan Note (Signed)
BP elevated in 140-168/58-115 range. Monitor BP. Start low dose lisinopril

## 2021-11-13 NOTE — Assessment & Plan Note (Signed)
Pt given PO and IV potassium in the ER Magnesium level is normal

## 2021-11-13 NOTE — Assessment & Plan Note (Signed)
Ms. Kirkendoll has atypical pneumonia as evidenced on CXR. Possible interstitial edema so BNP ordered and pending.  Started on treatment with Rocephin and Doxycycline Supplemental oxygen to keep O2 sat between 92-96%.  Incentive spirometer every 2 hours while awake.

## 2021-11-13 NOTE — Assessment & Plan Note (Signed)
Fall at home tonight when changing clothes. Has hematoma over right eye. No LOC. CT head negative for acute intracranial bleed or fracture

## 2021-11-14 DIAGNOSIS — J189 Pneumonia, unspecified organism: Principal | ICD-10-CM

## 2021-11-14 LAB — BASIC METABOLIC PANEL
Anion gap: 10 (ref 5–15)
BUN: 5 mg/dL — ABNORMAL LOW (ref 8–23)
CO2: 24 mmol/L (ref 22–32)
Calcium: 9.5 mg/dL (ref 8.9–10.3)
Chloride: 108 mmol/L (ref 98–111)
Creatinine, Ser: 0.59 mg/dL (ref 0.44–1.00)
GFR, Estimated: >60 mL/min (ref >60)
Glucose, Bld: 83 mg/dL (ref 70–99)
Potassium: 3.3 mmol/L — ABNORMAL LOW (ref 3.5–5.1)
Sodium: 142 mmol/L (ref 135–145)

## 2021-11-14 LAB — CBC
HCT: 39.1 % (ref 36.0–46.0)
Hemoglobin: 12 g/dL (ref 12.0–15.0)
MCH: 29.2 pg (ref 26.0–34.0)
MCHC: 30.7 g/dL (ref 30.0–36.0)
MCV: 95.1 fL (ref 80.0–100.0)
Platelets: 490 10*3/uL — ABNORMAL HIGH (ref 150–400)
RBC: 4.11 MIL/uL (ref 3.87–5.11)
RDW: 14.7 % (ref 11.5–15.5)
WBC: 11 10*3/uL — ABNORMAL HIGH (ref 4.0–10.5)
nRBC: 0 % (ref 0.0–0.2)

## 2021-11-14 LAB — PROCALCITONIN: Procalcitonin: 6.4 ng/mL

## 2021-11-14 LAB — MAGNESIUM: Magnesium: 1.9 mg/dL (ref 1.7–2.4)

## 2021-11-14 MED ORDER — POTASSIUM CHLORIDE CRYS ER 20 MEQ PO TBCR
40.0000 meq | EXTENDED_RELEASE_TABLET | Freq: Once | ORAL | Status: AC
  Administered 2021-11-14: 40 meq via ORAL
  Filled 2021-11-14: qty 2

## 2021-11-14 MED ORDER — METOPROLOL TARTRATE 5 MG/5ML IV SOLN
5.0000 mg | Freq: Once | INTRAVENOUS | Status: AC
  Administered 2021-11-14: 5 mg via INTRAVENOUS
  Filled 2021-11-14: qty 5

## 2021-11-14 NOTE — Progress Notes (Signed)
NEW ADMISSION NOTE New Admission Note:   Arrival Method: stretcher Mental Orientation: A&OX2 Telemetry:5M18 Assessment: Completed Skin: intact bruising bilateral arms, feet cracking and red, red/bruising over right eye IV: RFA Pain: 0/10 Tubes: NONE Safety Measures: Safety Fall Prevention Plan has been given, discussed and signed Admission: Completed [facility] Orientation: Patient has been orientated to the room, unit and staff.  Family: sister at bedside  Orders have been reviewed and implemented. Will continue to monitor the patient. Call light has been placed within reach and bed alarm has been activated.   Rise Traeger S Laiden Milles, RN

## 2021-11-14 NOTE — Progress Notes (Signed)
Pt  c/o chest pain checked VS obtained EKG paged physician in amion. Went back to check on pt she said the chest pain is gone.

## 2021-11-14 NOTE — Evaluation (Signed)
Physical Therapy Evaluation Patient Details Name: Colleen Parker MRN: [id number] DOB: June 16, 1946 Today's Date: 11/14/2021  History of Present Illness  Pt is a 76 y/o female admitted secondary to CAP, hypokalemia and fall. PMH includes dementia.  Clinical Impression  Pt admitted secondary to problem above with deficits below. Pt requiring total A for bed mobility tasks. Significant curvature of spine and R lateral lean and required max A for maintaining sitting balance. Per pt's sister, pt was able to ambulate short distances with assist. Recommending SNF level therapies at d/c to increase independence and safety. However, pt's sister may decide to return home with pt. If pt to return home recommending max HH services and hoyer lift for ease of transfers. Will continue to follow acutely.        Recommendations for follow up therapy are one component of a multi-disciplinary discharge planning process, led by the attending physician.  Recommendations may be updated based on patient status, additional functional criteria and insurance authorization.  Follow Up Recommendations Skilled nursing-short term rehab (<3 hours/day) (vs max HH services if pt's family decides to take pt home)    Assistance Recommended at Discharge Frequent or constant Supervision/Assistance  Patient can return home with the following  A lot of help with walking and/or transfers;A lot of help with bathing/dressing/bathroom;Help with stairs or ramp for entrance;Assist for transportation;Assistance with cooking/housework;Direct supervision/assist for financial management;Direct supervision/assist for medications management    Equipment Recommendations Other (comment) (hoyer lift and pad; reports hospital bed is supposed to come next week)  Recommendations for Other Services       Functional Status Assessment Patient has had a recent decline in their functional status and demonstrates the ability to make significant improvements  in function in a reasonable and predictable amount of time.     Precautions / Restrictions Precautions Precautions: Fall Restrictions Weight Bearing Restrictions: No      Mobility  Bed Mobility Overal bed mobility: Needs Assistance Bed Mobility: Supine to Sit, Sit to Supine, Rolling Rolling: Max assist   Supine to sit: Total assist Sit to supine: Total assist   General bed mobility comments: Total A for bed mobility. PT with curvature to the R and has R lateral lean in sitting. max-total A to maintain sitting balance. Max A for rolling for clean up    Transfers                        Ambulation/Gait                  Stairs            Wheelchair Mobility    Modified Rankin (Stroke Patients Only)       Balance Overall balance assessment: Needs assistance Sitting-balance support: Feet supported, Bilateral upper extremity supported Sitting balance-Leahy Scale: Poor Sitting balance - Comments: max A to maintain sitting balance Postural control: Right lateral lean                                   Pertinent Vitals/Pain Pain Assessment Pain Assessment: Faces Faces Pain Scale: Hurts a little bit Pain Location: generalized Pain Descriptors / Indicators: Grimacing, Guarding Pain Intervention(s): Monitored during session, Limited activity within patient's tolerance, Repositioned    Home Living Family/patient expects to be discharged to:: Private residence Living Arrangements: Other relatives (sister) Available Help at Discharge: Family Type of Home: House Home Access: Ramped  entrance       Home Layout: Two level;Able to live on main level with bedroom/bathroom Home Equipment: Wheelchair - Forensic psychologist (2 wheels);BSC/3in1      Prior Function Prior Level of Function : Needs assist             Mobility Comments: Uses WC for longer distances and sister assists with ambulation inside of home ADLs Comments:  Requires assist for ADL tasks     Hand Dominance        Extremity/Trunk Assessment   Upper Extremity Assessment Upper Extremity Assessment: Defer to OT evaluation    Lower Extremity Assessment Lower Extremity Assessment: Generalized weakness    Cervical / Trunk Assessment Cervical / Trunk Assessment: Other exceptions Cervical / Trunk Exceptions: curvature to the R in upper spine  Communication   Communication: No difficulties  Cognition Arousal/Alertness: Awake/alert Behavior During Therapy: WFL for tasks assessed/performed Overall Cognitive Status: History of cognitive impairments - at baseline                                 General Comments: Dementia at baseline        General Comments General comments (skin integrity, edema, etc.): Pt's sister present during session    Exercises     Assessment/Plan    PT Assessment Patient needs continued PT services  PT Problem List Decreased strength;Decreased activity tolerance;Decreased balance;Decreased mobility;Decreased knowledge of use of DME;Decreased knowledge of precautions;Decreased safety awareness;Decreased cognition       PT Treatment Interventions DME instruction;Gait training;Therapeutic activities;Functional mobility training;Therapeutic exercise;Balance training;Patient/family education    PT Goals (Current goals can be found in the Care Plan section)  Acute Rehab PT Goals Patient Stated Goal: to go home PT Goal Formulation: With patient/family Time For Goal Achievement: 11/28/21 Potential to Achieve Goals: Good    Frequency Min 3X/week     Co-evaluation               AM-PAC PT "6 Clicks" Mobility  Outcome Measure Help needed turning from your back to your side while in a flat bed without using bedrails?: Total Help needed moving from lying on your back to sitting on the side of a flat bed without using bedrails?: Total Help needed moving to and from a bed to a chair (including  a wheelchair)?: Total Help needed standing up from a chair using your arms (e.g., wheelchair or bedside chair)?: Total Help needed to walk in hospital room?: Total Help needed climbing 3-5 steps with a railing? : Total 6 Click Score: 6    End of Session Equipment Utilized During Treatment: Gait belt Activity Tolerance: Patient tolerated treatment well Patient left: in bed;with call bell/phone within reach;with family/visitor present (on stretcher in ED) Nurse Communication: Mobility status PT Visit Diagnosis: Other abnormalities of gait and mobility (R26.89);Unsteadiness on feet (R26.81);Muscle weakness (generalized) (M62.81);Difficulty in walking, not elsewhere classified (R26.2)    Time: 6767-2094 PT Time Calculation (min) (ACUTE ONLY): 28 min   Charges:   PT Evaluation $PT Eval Moderate Complexity: 1 Mod PT Treatments $Therapeutic Activity: 8-22 mins        Cindee Salt, DPT  Acute Rehabilitation Services  Pager: [phone] Office: [phone]   [facility] 11/14/2021, 10:46 AM

## 2021-11-14 NOTE — ED Notes (Signed)
Pt bed taken away due to call out on the floor - Consulting civil engineer and pt + family made aware at this time

## 2021-11-14 NOTE — Progress Notes (Signed)
PROGRESS NOTE    Colleen Parker  UXN:[id number] DOB: 10/18/1945 DOA: 11/13/2021 PCP: Lula Olszewski, MD  Brief Narrative: 76 year old female history of dementia lives at home with her sister, multiple compression fractures of the spine, brought in after a fall at home.  Patient has been treated as an outpatient for urinary tract infection with Macrodantin.  Patient denies any urinary symptoms at this time.  She denies a syncopal episode.  She also has complaints of cough and shortness of breath and is found to have leukocytosis and fever.  She is admitted for community-acquired pneumonia.   Assessment & Plan:   Principal Problem:   CAP (community acquired pneumonia) Active Problems:   Hypokalemia   Prolonged QT interval   Dementia (HCC)   Blood pressure elevated without history of HTN   Traumatic hematoma of head  #1 urinary tract infection being treated as an outpatient.  I discussed with her primary physician who did tell me that patient had Staph epidermidis in her urine culture sensitive to Macrobid Bactrim and vancomycin.  She has 2 more tablets of Macrobid to finish her course will continue and finish the course.  #2 community-acquired pneumonia-patient has complaints of cough and shortness of breath.  She has been started on doxycycline and Rocephin.  Chest x-ray shows findings represent atypical pneumonia versus fluid.  Her BNP is mildly elevated at 332.  I will hold off on diuretics.  However overall she appears dry and dehydrated with decreased p.o. intake.  We will continue antibiotics for now.  She is on room air with normal saturation at rest.  Her Tmax was 100.0. Leukocytosis 11.0 down from 12.3. We will check ambulatory oxygen saturation. Procalcitonin pending  #3 status post fall hematoma over the right I.  CT head no acute findings.  Check orthostatics.  #4 hypokalemia replete mag level pending  Estimated body mass index is 18.97 kg/m as calculated from the following:    Height as of 11/12/21: 5\' 5"  (1.651 m).   Weight as of 11/12/21: 51.7 kg.  DVT prophylaxis: Lovenox  code Status: Full code  family Communication: Discussed with her sister at bedside  disposition Plan:  Status is: Inpatient Remains inpatient appropriate because: Patient admitted with fever shortness of breath and cough and community-acquired pneumonia and recent fall.  PT evaluation pending    Consultants:  None  Procedures none Antimicrobials: Rocephin and doxycycline  Subjective: Patient resting in bed sister by the bedside she does not appear to be in acute distress however she has a productive cough  Objective: Vitals:   11/14/21 0530 11/14/21 0545 11/14/21 0604 11/14/21 0645  BP: (!) 161/75 (!) 144/66  (!) 151/96  Pulse:  79    Resp: 16 (!) 27  13  Temp:   100 F (37.8 C)   TempSrc:   Oral   SpO2:  90%      Intake/Output Summary (Last 24 hours) at 11/14/2021 1328 Last data filed at 11/14/2021 0215 Gross per 24 hour  Intake 650.83 ml  Output --  Net 650.83 ml   There were no vitals filed for this visit.  Examination:  General exam: Appears frail elderly chronically ill looking  respiratory system rhonchi to auscultation. Respiratory effort normal. Cardiovascular system: S1 & S2 heard, RRR. No JVD, murmurs, rubs, gallops or clicks. No pedal edema. Gastrointestinal system: Abdomen is nondistended, soft and nontender. No organomegaly or masses felt. Normal bowel sounds heard. Central nervous system: Alert and oriented. No focal neurological deficits. Extremities: Symmetric  5 x 5 power. Skin: No rashes, lesions or ulcers Psychiatry: Judgement and insight appear normal. Mood & affect appropriate.     Data Reviewed: I have personally reviewed following labs and imaging studies  CBC: Recent Labs  Lab 11/13/21 2002 11/14/21 0426  WBC 12.3* 11.0*  NEUTROABS 9.2*  --   HGB 11.5* 12.0  HCT 36.4 39.1  MCV 93.1 95.1  PLT 462* 490*   Basic Metabolic  Panel: Recent Labs  Lab 11/13/21 2002 11/14/21 0426  NA 137 142  K 2.9* 3.3*  CL 102 108  CO2 25 24  GLUCOSE 119* 83  BUN 7* 5*  CREATININE 0.71 0.59  CALCIUM 9.1 9.5  MG 1.9  --    GFR: Estimated Creatinine Clearance: 51.1 mL/min (by C-G formula based on SCr of 0.59 mg/dL). Liver Function Tests: Recent Labs  Lab 11/13/21 2002  AST 17  ALT 10  ALKPHOS 95  BILITOT 0.6  PROT 6.6  ALBUMIN 2.9*   No results for input(s): LIPASE, AMYLASE in the last 168 hours. No results for input(s): AMMONIA in the last 168 hours. Coagulation Profile: No results for input(s): INR, PROTIME in the last 168 hours. Cardiac Enzymes: No results for input(s): CKTOTAL, CKMB, CKMBINDEX, TROPONINI in the last 168 hours. BNP (last 3 results) No results for input(s): PROBNP in the last 8760 hours. HbA1C: No results for input(s): HGBA1C in the last 72 hours. CBG: No results for input(s): GLUCAP in the last 168 hours. Lipid Profile: No results for input(s): CHOL, HDL, LDLCALC, TRIG, CHOLHDL, LDLDIRECT in the last 72 hours. Thyroid Function Tests: No results for input(s): TSH, T4TOTAL, FREET4, T3FREE, THYROIDAB in the last 72 hours. Anemia Panel: No results for input(s): VITAMINB12, FOLATE, FERRITIN, TIBC, IRON, RETICCTPCT in the last 72 hours. Sepsis Labs: Recent Labs  Lab 11/13/21 2002  LATICACIDVEN 1.8    Recent Results (from the past 240 hour(s))  Resp Panel by RT-PCR (Flu A&B, Covid)     Status: None   Collection Time: 11/13/21  7:51 PM   Specimen: Nasopharyngeal(NP) swabs in vial transport medium  Result Value Ref Range Status   SARS Coronavirus 2 by RT PCR NEGATIVE NEGATIVE Final    Comment: (NOTE) SARS-CoV-2 target nucleic acids are NOT DETECTED.  The SARS-CoV-2 RNA is generally detectable in upper respiratory specimens during the acute phase of infection. The lowest concentration of SARS-CoV-2 viral copies this assay can detect is 138 copies/mL. A negative result does not  preclude SARS-Cov-2 infection and should not be used as the sole basis for treatment or other patient management decisions. A negative result may occur with  improper specimen collection/handling, submission of specimen other than nasopharyngeal swab, presence of viral mutation(s) within the areas targeted by this assay, and inadequate number of viral copies(<138 copies/mL). A negative result must be combined with clinical observations, patient history, and epidemiological information. The expected result is Negative.  Fact Sheet for Patients:  BloggerCourse.comhttps://www.fda.gov/media/152166/download  Fact Sheet for Healthcare Providers:  SeriousBroker.ithttps://www.fda.gov/media/152162/download  This test is no t yet approved or cleared by the [place] FDA and  has been authorized for detection and/or diagnosis of SARS-CoV-2 by FDA under an Emergency Use Authorization (EUA). This EUA will remain  in effect (meaning this test can be used) for the duration of the COVID-19 declaration under Section 564(b)(1) of the Act, 21 U.S.C.section 360bbb-3(b)(1), unless the authorization is terminated  or revoked sooner.       Influenza A by PCR NEGATIVE NEGATIVE Final   Influenza B  by PCR NEGATIVE NEGATIVE Final    Comment: (NOTE) The Xpert Xpress SARS-CoV-2/FLU/RSV plus assay is intended as an aid in the diagnosis of influenza from Nasopharyngeal swab specimens and should not be used as a sole basis for treatment. Nasal washings and aspirates are unacceptable for Xpert Xpress SARS-CoV-2/FLU/RSV testing.  Fact Sheet for Patients: BloggerCourse.com  Fact Sheet for Healthcare Providers: SeriousBroker.it  This test is not yet approved or cleared by the [place] FDA and has been authorized for detection and/or diagnosis of SARS-CoV-2 by FDA under an Emergency Use Authorization (EUA). This EUA will remain in effect (meaning this test can be used) for the  duration of the COVID-19 declaration under Section 564(b)(1) of the Act, 21 U.S.C. section 360bbb-3(b)(1), unless the authorization is terminated or revoked.  Performed at [facility], [address] [place], [place] [address]   Culture, blood (routine x 2)     Status: None (Preliminary result)   Collection Time: 11/13/21  8:02 PM   Specimen: BLOOD LEFT ARM  Result Value Ref Range Status   Specimen Description BLOOD LEFT ARM  Final   Special Requests   Final    BOTTLES DRAWN AEROBIC AND ANAEROBIC Blood Culture adequate volume   Culture   Final    NO GROWTH < 12 HOURS Performed at [facility], [address], [place], [place] [address]    Report Status PENDING  Incomplete  Culture, blood (routine x 2)     Status: None (Preliminary result)   Collection Time: 11/13/21  9:23 PM   Specimen: BLOOD LEFT ARM  Result Value Ref Range Status   Specimen Description BLOOD LEFT ARM  Final   Special Requests   Final    BOTTLES DRAWN AEROBIC AND ANAEROBIC Blood Culture results may not be optimal due to an inadequate volume of blood received in culture bottles   Culture   Final    NO GROWTH < 12 HOURS Performed at [facility], [address], [place], [place] [address]    Report Status PENDING  Incomplete         Radiology Studies: CT Head Wo Contrast  Result Date: 11/13/2021 CLINICAL DATA:  Trauma. EXAM: CT HEAD WITHOUT CONTRAST CT MAXILLOFACIAL WITHOUT CONTRAST CT CERVICAL SPINE WITHOUT CONTRAST TECHNIQUE: Multidetector CT imaging of the head, cervical spine, and maxillofacial structures were performed using the standard protocol without intravenous contrast. Multiplanar CT image reconstructions of the cervical spine and maxillofacial structures were also generated. RADIATION DOSE REDUCTION: This exam was performed according to the departmental dose-optimization program which includes automated exposure control, adjustment of the mA and/or kV according to patient size  and/or use of iterative reconstruction technique. COMPARISON:  Head CT dated 07/22/2020. FINDINGS: CT HEAD FINDINGS Brain: Mild age-related atrophy and moderate chronic microvascular ischemic changes. No acute intracranial hemorrhage. No mass effect or midline shift. No extra-axial fluid collection. Vascular: No hyperdense vessel or unexpected calcification. Skull: Normal. Negative for fracture or focal lesion. Other: Contusion over the forehead. CT MAXILLOFACIAL FINDINGS Osseous: No acute fracture.  No mandibular dislocation. Orbits: The globes and retro-orbital fat are preserved. Sinuses: Diffuse mucoperiosteal thickening of paranasal sinuses with partial opacification of the right maxillary and sphenoid sinuses with air-fluid level. The mastoid air cells are clear. Soft tissues: Soft tissue contusion over the forehead. CT CERVICAL SPINE FINDINGS Alignment: No acute subluxation. Skull base and vertebrae: No acute cervical spine fracture. Age indeterminate mild compression fracture of superior endplate of T4. Correlation with clinical  exam and point tenderness recommended. Soft tissues and spinal canal: No prevertebral fluid or swelling. No visible canal hematoma. Disc levels:  No acute findings.  Degenerative changes. Upper chest: Negative. Other: Bilateral carotid bulb calcified plaques. IMPRESSION: 1. No acute intracranial pathology. 2. No acute facial bone fractures. 3. No acute fracture or subluxation of the cervical spine. 4. Age indeterminate mild compression fracture of superior endplate of T4. Correlation with clinical exam and point tenderness recommended. Electronically Signed   By: Elgie CollardArash  Radparvar M.D.   On: 11/13/2021 22:07   CT Cervical Spine Wo Contrast  Result Date: 11/13/2021 CLINICAL DATA:  Trauma. EXAM: CT HEAD WITHOUT CONTRAST CT MAXILLOFACIAL WITHOUT CONTRAST CT CERVICAL SPINE WITHOUT CONTRAST TECHNIQUE: Multidetector CT imaging of the head, cervical spine, and maxillofacial structures  were performed using the standard protocol without intravenous contrast. Multiplanar CT image reconstructions of the cervical spine and maxillofacial structures were also generated. RADIATION DOSE REDUCTION: This exam was performed according to the departmental dose-optimization program which includes automated exposure control, adjustment of the mA and/or kV according to patient size and/or use of iterative reconstruction technique. COMPARISON:  Head CT dated 07/22/2020. FINDINGS: CT HEAD FINDINGS Brain: Mild age-related atrophy and moderate chronic microvascular ischemic changes. No acute intracranial hemorrhage. No mass effect or midline shift. No extra-axial fluid collection. Vascular: No hyperdense vessel or unexpected calcification. Skull: Normal. Negative for fracture or focal lesion. Other: Contusion over the forehead. CT MAXILLOFACIAL FINDINGS Osseous: No acute fracture.  No mandibular dislocation. Orbits: The globes and retro-orbital fat are preserved. Sinuses: Diffuse mucoperiosteal thickening of paranasal sinuses with partial opacification of the right maxillary and sphenoid sinuses with air-fluid level. The mastoid air cells are clear. Soft tissues: Soft tissue contusion over the forehead. CT CERVICAL SPINE FINDINGS Alignment: No acute subluxation. Skull base and vertebrae: No acute cervical spine fracture. Age indeterminate mild compression fracture of superior endplate of T4. Correlation with clinical exam and point tenderness recommended. Soft tissues and spinal canal: No prevertebral fluid or swelling. No visible canal hematoma. Disc levels:  No acute findings.  Degenerative changes. Upper chest: Negative. Other: Bilateral carotid bulb calcified plaques. IMPRESSION: 1. No acute intracranial pathology. 2. No acute facial bone fractures. 3. No acute fracture or subluxation of the cervical spine. 4. Age indeterminate mild compression fracture of superior endplate of T4. Correlation with clinical exam  and point tenderness recommended. Electronically Signed   By: Elgie CollardArash  Radparvar M.D.   On: 11/13/2021 22:07   DG Chest Portable 1 View  Result Date: 11/13/2021 CLINICAL DATA:  Provided history: Fever. EXAM: PORTABLE CHEST 1 VIEW COMPARISON:  CT angiogram chest 11/12/2021 prior chest radiographs 10/10/2021 and earlier. FINDINGS: There is significant patient rotation to the right. Heart size at the upper limits of normal. Aortic atherosclerosis. Mild prominence of the interstitial lung markings. No appreciable pleural effusion or evidence of pneumothorax. No acute bony abnormality identified. IMPRESSION: Mild prominence of the interstitial lung markings, which may reflect interstitial edema or possibly atypical/viral pneumonia. Aortic Atherosclerosis (ICD10-I70.0). Electronically Signed   By: Jackey LogeKyle  Golden D.O.   On: 11/13/2021 21:05   CT ANGIO CHEST AORTA W/CM & OR WO/CM  Result Date: 11/12/2021 CLINICAL DATA:  Follow-up aortic aneurysm. EXAM: CT ANGIOGRAPHY CHEST WITH CONTRAST TECHNIQUE: Multidetector CT imaging of the chest was performed using the standard protocol during bolus administration of intravenous contrast. Multiplanar CT image reconstructions and MIPs were obtained to evaluate the vascular anatomy. RADIATION DOSE REDUCTION: This exam was performed according to the departmental dose-optimization program which  includes automated exposure control, adjustment of the mA and/or kV according to patient size and/or use of iterative reconstruction technique. CONTRAST:  38mL ISOVUE-370 IOPAMIDOL (ISOVUE-370) INJECTION 76% COMPARISON:  CT 05/06/2021 FINDINGS: Cardiovascular: Ascending thoracic aorta measures 5.2 cm in axial dimension at the level of the pulmonary outflow track compared to 5.3 cm on comparison exam. Diameter measured at same level and orientation. Descending thoracic aorta normal caliber. Great vessels normal. Mediastinum/Nodes: No axillary or supraclavicular adenopathy. No mediastinal or  hilar adenopathy. No pericardial fluid. Esophagus normal. Lungs/Pleura: Mild interstitial edema pattern. Thickening along the RIGHT oblique fissure. Trace effusions. Airspace disease Upper Abdomen: Limited view of the liver, kidneys, pancreas are unremarkable. Normal adrenal glands. Musculoskeletal: No aggressive osseous lesion. Review of the MIP images confirms the above findings. IMPRESSION: 1. 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 Recommend semi-annual imaging followup by CTA or MRA and referral to cardiothoracic surgery if not already obtained. This recommendation follows 2010 ACCF/AHA/AATS/ACR/ASA/SCA/SCAI/SIR/STS/SVM Guidelines for the Diagnosis and Management of Patients With Thoracic Aortic Disease. Circulation. 2010; 121: X528-U132. Aortic aneurysm NOS (ICD10-I71.9) 2.  Mild interstitial edema pattern in the lungs. Electronically Signed   By: Genevive Bi M.D.   On: 11/12/2021 14:02   CT Maxillofacial Wo Contrast  Result Date: 11/13/2021 CLINICAL DATA:  Trauma. EXAM: CT HEAD WITHOUT CONTRAST CT MAXILLOFACIAL WITHOUT CONTRAST CT CERVICAL SPINE WITHOUT CONTRAST TECHNIQUE: Multidetector CT imaging of the head, cervical spine, and maxillofacial structures were performed using the standard protocol without intravenous contrast. Multiplanar CT image reconstructions of the cervical spine and maxillofacial structures were also generated. RADIATION DOSE REDUCTION: This exam was performed according to the departmental dose-optimization program which includes automated exposure control, adjustment of the mA and/or kV according to patient size and/or use of iterative reconstruction technique. COMPARISON:  Head CT dated 07/22/2020. FINDINGS: CT HEAD FINDINGS Brain: Mild age-related atrophy and moderate chronic microvascular ischemic changes. No acute intracranial hemorrhage. No mass effect or midline shift. No extra-axial fluid collection. Vascular: No hyperdense vessel or unexpected  calcification. Skull: Normal. Negative for fracture or focal lesion. Other: Contusion over the forehead. CT MAXILLOFACIAL FINDINGS Osseous: No acute fracture.  No mandibular dislocation. Orbits: The globes and retro-orbital fat are preserved. Sinuses: Diffuse mucoperiosteal thickening of paranasal sinuses with partial opacification of the right maxillary and sphenoid sinuses with air-fluid level. The mastoid air cells are clear. Soft tissues: Soft tissue contusion over the forehead. CT CERVICAL SPINE FINDINGS Alignment: No acute subluxation. Skull base and vertebrae: No acute cervical spine fracture. Age indeterminate mild compression fracture of superior endplate of T4. Correlation with clinical exam and point tenderness recommended. Soft tissues and spinal canal: No prevertebral fluid or swelling. No visible canal hematoma. Disc levels:  No acute findings.  Degenerative changes. Upper chest: Negative. Other: Bilateral carotid bulb calcified plaques. IMPRESSION: 1. No acute intracranial pathology. 2. No acute facial bone fractures. 3. No acute fracture or subluxation of the cervical spine. 4. Age indeterminate mild compression fracture of superior endplate of T4. Correlation with clinical exam and point tenderness recommended. Electronically Signed   By: Elgie Collard M.D.   On: 11/13/2021 22:07        Scheduled Meds: Continuous Infusions:  cefTRIAXone (ROCEPHIN)  IV     doxycycline (VIBRAMYCIN) IV 100 mg (11/14/21 1121)   lactated ringers 50 mL/hr at 11/14/21 0006     LOS: 1 day    Time spent:   Alwyn Ren, MD 11/14/2021, 1:28 PM

## 2021-11-14 NOTE — ED Notes (Signed)
3W called and asked to initiate purple man

## 2021-11-14 NOTE — ED Notes (Signed)
MD notified systolic BP >160 per order set - pt readjusted in bed and cuff switched to other arm

## 2021-11-15 DIAGNOSIS — J189 Pneumonia, unspecified organism: Secondary | ICD-10-CM | POA: Diagnosis not present

## 2021-11-15 LAB — CBC
HCT: 36 % (ref 36.0–46.0)
Hemoglobin: 11.8 g/dL — ABNORMAL LOW (ref 12.0–15.0)
MCH: 30.1 pg (ref 26.0–34.0)
MCHC: 32.8 g/dL (ref 30.0–36.0)
MCV: 91.8 fL (ref 80.0–100.0)
Platelets: 445 10*3/uL — ABNORMAL HIGH (ref 150–400)
RBC: 3.92 MIL/uL (ref 3.87–5.11)
RDW: 14.7 % (ref 11.5–15.5)
WBC: 9.8 10*3/uL (ref 4.0–10.5)
nRBC: 0 % (ref 0.0–0.2)

## 2021-11-15 LAB — BASIC METABOLIC PANEL
Anion gap: 11 (ref 5–15)
BUN: 5 mg/dL — ABNORMAL LOW (ref 8–23)
CO2: 21 mmol/L — ABNORMAL LOW (ref 22–32)
Calcium: 9.1 mg/dL (ref 8.9–10.3)
Chloride: 106 mmol/L (ref 98–111)
Creatinine, Ser: 0.55 mg/dL (ref 0.44–1.00)
GFR, Estimated: >60 mL/min (ref >60)
Glucose, Bld: 90 mg/dL (ref 70–99)
Potassium: 3.4 mmol/L — ABNORMAL LOW (ref 3.5–5.1)
Sodium: 138 mmol/L (ref 135–145)

## 2021-11-15 MED ORDER — POTASSIUM CHLORIDE CRYS ER 20 MEQ PO TBCR
40.0000 meq | EXTENDED_RELEASE_TABLET | Freq: Once | ORAL | Status: AC
  Administered 2021-11-15: 40 meq via ORAL
  Filled 2021-11-15: qty 2

## 2021-11-15 MED ORDER — DOXYCYCLINE HYCLATE 100 MG PO TABS
100.0000 mg | ORAL_TABLET | Freq: Two times a day (BID) | ORAL | Status: DC
  Administered 2021-11-15 – 2021-11-17 (×4): 100 mg via ORAL
  Filled 2021-11-15 (×4): qty 1

## 2021-11-15 NOTE — Progress Notes (Signed)
PROGRESS NOTE    Colleen Parker  ZDG:[id number] DOB: [birth year] DOA: 11/13/2021 PCP: Lula Olszewski, MD  Brief Narrative: 76 year old female history of dementia lives at home with her sister, multiple compression fractures of the spine, brought in after a fall at home.  Patient has been treated as an outpatient for urinary tract infection with Macrodantin.  Patient denies any urinary symptoms at this time.  She denies a syncopal episode.  She also has complaints of cough and shortness of breath and is found to have leukocytosis and fever.  She is admitted for community-acquired pneumonia.   Assessment & Plan:   Principal Problem:   CAP (community acquired pneumonia) Active Problems:   Hypokalemia   Prolonged QT interval   Dementia (HCC)   Blood pressure elevated without history of HTN   Traumatic hematoma of head  #1 urinary tract infection being treated as an outpatient.  I discussed with her primary physician who did tell me that patient had Staph epidermidis in her urine culture sensitive to Macrobid Bactrim and vancomycin.  She has 2 more tablets of Macrobid to finish her course will continue and finish the course.  #2 community-acquired pneumonia-patient has complaints of cough and shortness of breath.  She has been started on doxycycline and Rocephin.  Chest x-ray shows findings represent atypical pneumonia versus fluid.  Her BNP is mildly elevated at 332.  I will hold off on diuretics.  However overall she appears dry and dehydrated with decreased p.o. intake.  We will continue antibiotics for now.  She is on room air with normal saturation at rest.   Leukocytosis 9.8 from11.0 down from 12.3. Procalcitonin elevated at 6.4  Seen by PT recommends SNF   #3 status post fall   CT head no acute findings.  Check orthostatics.  #4 hypokalemia replete mag level 1.9  Estimated body mass index is 21.44 kg/m as calculated from the following:   Height as of this encounter: 5\' 3"  (1.6 m).    Weight as of this encounter: 54.9 kg.  DVT prophylaxis: Lovenox  code Status: Full code  family Communication: Discussed with her sister at bedside  disposition Plan:  Status is: Inpatient Remains inpatient appropriate because: Patient admitted with fever shortness of breath and cough and community-acquired pneumonia and recent fall.  PT evaluation pending    Consultants:  None  Procedures none Antimicrobials: Rocephin and doxycycline  Subjective: Resting in bed in nad  Sister by the bed side Patient very weak sister unable to care for her at this level  Objective: Vitals:   11/15/21 0100 11/15/21 0156 11/15/21 0554 11/15/21 0556  BP: (!) 160/84   (!) 149/78  Pulse: (!) 113 (!) 105  (!) 102  Resp: 18   18  Temp: 97.8 F (36.6 C)  97.9 F (36.6 C)   TempSrc: Oral  Oral   SpO2: 95%   95%  Weight:      Height:        Intake/Output Summary (Last 24 hours) at 11/15/2021 1237 Last data filed at 11/15/2021 0900 Gross per 24 hour  Intake 2274.04 ml  Output 1200 ml  Net 1074.04 ml    Filed Weights   11/14/21 1648  Weight: 54.9 kg    Examination:  General exam: Appears frail elderly chronically ill looking  respiratory system rhonchi to auscultation. Respiratory effort normal. Cardiovascular system: S1 & S2 heard, RRR. No JVD, murmurs, rubs, gallops or clicks. No pedal edema. Gastrointestinal system: Abdomen is nondistended, soft and nontender.  No organomegaly or masses felt. Normal bowel sounds heard. Central nervous system: Alert and oriented. No focal neurological deficits. Extremities: Symmetric 5 x 5 power. Skin: No rashes, lesions or ulcers Psychiatry: Judgement and insight appear normal. Mood & affect appropriate.     Data Reviewed: I have personally reviewed following labs and imaging studies  CBC: Recent Labs  Lab 11/13/21 2002 11/14/21 0426 11/15/21 0736  WBC 12.3* 11.0* 9.8  NEUTROABS 9.2*  --   --   HGB 11.5* 12.0 11.8*  HCT 36.4 39.1 36.0  MCV  93.1 95.1 91.8  PLT 462* 490* 445*    Basic Metabolic Panel: Recent Labs  Lab 11/13/21 2002 11/14/21 0426 11/14/21 1706 11/15/21 0736  NA 137 142  --  138  K 2.9* 3.3*  --  3.4*  CL 102 108  --  106  CO2 25 24  --  21*  GLUCOSE 119* 83  --  90  BUN 7* 5*  --  5*  CREATININE 0.71 0.59  --  0.55  CALCIUM 9.1 9.5  --  9.1  MG 1.9  --  1.9  --     GFR: Estimated Creatinine Clearance: 51.8 mL/min (by C-G formula based on SCr of 0.55 mg/dL). Liver Function Tests: Recent Labs  Lab 11/13/21 2002  AST 17  ALT 10  ALKPHOS 95  BILITOT 0.6  PROT 6.6  ALBUMIN 2.9*    No results for input(s): LIPASE, AMYLASE in the last 168 hours. No results for input(s): AMMONIA in the last 168 hours. Coagulation Profile: No results for input(s): INR, PROTIME in the last 168 hours. Cardiac Enzymes: No results for input(s): CKTOTAL, CKMB, CKMBINDEX, TROPONINI in the last 168 hours. BNP (last 3 results) No results for input(s): PROBNP in the last 8760 hours. HbA1C: No results for input(s): HGBA1C in the last 72 hours. CBG: No results for input(s): GLUCAP in the last 168 hours. Lipid Profile: No results for input(s): CHOL, HDL, LDLCALC, TRIG, CHOLHDL, LDLDIRECT in the last 72 hours. Thyroid Function Tests: No results for input(s): TSH, T4TOTAL, FREET4, T3FREE, THYROIDAB in the last 72 hours. Anemia Panel: No results for input(s): VITAMINB12, FOLATE, FERRITIN, TIBC, IRON, RETICCTPCT in the last 72 hours. Sepsis Labs: Recent Labs  Lab 11/13/21 2002 11/14/21 1706  PROCALCITON  --  6.40  LATICACIDVEN 1.8  --      Recent Results (from the past 240 hour(s))  Resp Panel by RT-PCR (Flu A&B, Covid)     Status: None   Collection Time: 11/13/21  7:51 PM   Specimen: Nasopharyngeal(NP) swabs in vial transport medium  Result Value Ref Range Status   SARS Coronavirus 2 by RT PCR NEGATIVE NEGATIVE Final    Comment: (NOTE) SARS-CoV-2 target nucleic acids are NOT DETECTED.  The SARS-CoV-2 RNA  is generally detectable in upper respiratory specimens during the acute phase of infection. The lowest concentration of SARS-CoV-2 viral copies this assay can detect is 138 copies/mL. A negative result does not preclude SARS-Cov-2 infection and should not be used as the sole basis for treatment or other patient management decisions. A negative result may occur with  improper specimen collection/handling, submission of specimen other than nasopharyngeal swab, presence of viral mutation(s) within the areas targeted by this assay, and inadequate number of viral copies(<138 copies/mL). A negative result must be combined with clinical observations, patient history, and epidemiological information. The expected result is Negative.  Fact Sheet for Patients:  BloggerCourse.com  Fact Sheet for Healthcare Providers:  SeriousBroker.it  This test  is no t yet approved or cleared by the [place] FDA and  has been authorized for detection and/or diagnosis of SARS-CoV-2 by FDA under an Emergency Use Authorization (EUA). This EUA will remain  in effect (meaning this test can be used) for the duration of the COVID-19 declaration under Section 564(b)(1) of the Act, 21 U.S.C.section 360bbb-3(b)(1), unless the authorization is terminated  or revoked sooner.       Influenza A by PCR NEGATIVE NEGATIVE Final   Influenza B by PCR NEGATIVE NEGATIVE Final    Comment: (NOTE) The Xpert Xpress SARS-CoV-2/FLU/RSV plus assay is intended as an aid in the diagnosis of influenza from Nasopharyngeal swab specimens and should not be used as a sole basis for treatment. Nasal washings and aspirates are unacceptable for Xpert Xpress SARS-CoV-2/FLU/RSV testing.  Fact Sheet for Patients: BloggerCourse.comhttps://www.fda.gov/media/152166/download  Fact Sheet for Healthcare Providers: SeriousBroker.ithttps://www.fda.gov/media/152162/download  This test is not yet approved or cleared by the  [place] FDA and has been authorized for detection and/or diagnosis of SARS-CoV-2 by FDA under an Emergency Use Authorization (EUA). This EUA will remain in effect (meaning this test can be used) for the duration of the COVID-19 declaration under Section 564(b)(1) of the Act, 21 U.S.C. section 360bbb-3(b)(1), unless the authorization is terminated or revoked.  Performed at [facility], [address] [place], [place] [address]   Culture, blood (routine x 2)     Status: None (Preliminary result)   Collection Time: 11/13/21  8:02 PM   Specimen: BLOOD LEFT ARM  Result Value Ref Range Status   Specimen Description BLOOD LEFT ARM  Final   Special Requests   Final    BOTTLES DRAWN AEROBIC AND ANAEROBIC Blood Culture adequate volume   Culture   Final    NO GROWTH 2 DAYS Performed at [facility], [address] [place], [place] [address]    Report Status PENDING  Incomplete  Culture, blood (routine x 2)     Status: None (Preliminary result)   Collection Time: 11/13/21  9:23 PM   Specimen: BLOOD LEFT ARM  Result Value Ref Range Status   Specimen Description BLOOD LEFT ARM  Final   Special Requests   Final    BOTTLES DRAWN AEROBIC AND ANAEROBIC Blood Culture results may not be optimal due to an inadequate volume of blood received in culture bottles   Culture   Final    NO GROWTH 2 DAYS Performed at [facility], [address] [place], [place] [address]    Report Status PENDING  Incomplete          Radiology Studies: CT Head Wo Contrast  Result Date: 11/13/2021 CLINICAL DATA:  Trauma. EXAM: CT HEAD WITHOUT CONTRAST CT MAXILLOFACIAL WITHOUT CONTRAST CT CERVICAL SPINE WITHOUT CONTRAST TECHNIQUE: Multidetector CT imaging of the head, cervical spine, and maxillofacial structures were performed using the standard protocol without intravenous contrast. Multiplanar CT image reconstructions of the cervical spine and maxillofacial structures were also generated.  RADIATION DOSE REDUCTION: This exam was performed according to the departmental dose-optimization program which includes automated exposure control, adjustment of the mA and/or kV according to patient size and/or use of iterative reconstruction technique. COMPARISON:  Head CT dated 07/22/2020. FINDINGS: CT HEAD FINDINGS Brain: Mild age-related atrophy and moderate chronic microvascular ischemic changes. No acute intracranial hemorrhage. No mass effect or midline shift. No extra-axial fluid collection. Vascular: No hyperdense vessel or unexpected calcification. Skull: Normal. Negative for fracture or focal lesion. Other: Contusion over  the forehead. CT MAXILLOFACIAL FINDINGS Osseous: No acute fracture.  No mandibular dislocation. Orbits: The globes and retro-orbital fat are preserved. Sinuses: Diffuse mucoperiosteal thickening of paranasal sinuses with partial opacification of the right maxillary and sphenoid sinuses with air-fluid level. The mastoid air cells are clear. Soft tissues: Soft tissue contusion over the forehead. CT CERVICAL SPINE FINDINGS Alignment: No acute subluxation. Skull base and vertebrae: No acute cervical spine fracture. Age indeterminate mild compression fracture of superior endplate of T4. Correlation with clinical exam and point tenderness recommended. Soft tissues and spinal canal: No prevertebral fluid or swelling. No visible canal hematoma. Disc levels:  No acute findings.  Degenerative changes. Upper chest: Negative. Other: Bilateral carotid bulb calcified plaques. IMPRESSION: 1. No acute intracranial pathology. 2. No acute facial bone fractures. 3. No acute fracture or subluxation of the cervical spine. 4. Age indeterminate mild compression fracture of superior endplate of T4. Correlation with clinical exam and point tenderness recommended. Electronically Signed   By: Elgie CollardArash  Radparvar M.D.   On: 11/13/2021 22:07   CT Cervical Spine Wo Contrast  Result Date: 11/13/2021 CLINICAL DATA:   Trauma. EXAM: CT HEAD WITHOUT CONTRAST CT MAXILLOFACIAL WITHOUT CONTRAST CT CERVICAL SPINE WITHOUT CONTRAST TECHNIQUE: Multidetector CT imaging of the head, cervical spine, and maxillofacial structures were performed using the standard protocol without intravenous contrast. Multiplanar CT image reconstructions of the cervical spine and maxillofacial structures were also generated. RADIATION DOSE REDUCTION: This exam was performed according to the departmental dose-optimization program which includes automated exposure control, adjustment of the mA and/or kV according to patient size and/or use of iterative reconstruction technique. COMPARISON:  Head CT dated 07/22/2020. FINDINGS: CT HEAD FINDINGS Brain: Mild age-related atrophy and moderate chronic microvascular ischemic changes. No acute intracranial hemorrhage. No mass effect or midline shift. No extra-axial fluid collection. Vascular: No hyperdense vessel or unexpected calcification. Skull: Normal. Negative for fracture or focal lesion. Other: Contusion over the forehead. CT MAXILLOFACIAL FINDINGS Osseous: No acute fracture.  No mandibular dislocation. Orbits: The globes and retro-orbital fat are preserved. Sinuses: Diffuse mucoperiosteal thickening of paranasal sinuses with partial opacification of the right maxillary and sphenoid sinuses with air-fluid level. The mastoid air cells are clear. Soft tissues: Soft tissue contusion over the forehead. CT CERVICAL SPINE FINDINGS Alignment: No acute subluxation. Skull base and vertebrae: No acute cervical spine fracture. Age indeterminate mild compression fracture of superior endplate of T4. Correlation with clinical exam and point tenderness recommended. Soft tissues and spinal canal: No prevertebral fluid or swelling. No visible canal hematoma. Disc levels:  No acute findings.  Degenerative changes. Upper chest: Negative. Other: Bilateral carotid bulb calcified plaques. IMPRESSION: 1. No acute intracranial pathology.  2. No acute facial bone fractures. 3. No acute fracture or subluxation of the cervical spine. 4. Age indeterminate mild compression fracture of superior endplate of T4. Correlation with clinical exam and point tenderness recommended. Electronically Signed   By: Elgie CollardArash  Radparvar M.D.   On: 11/13/2021 22:07   DG Chest Portable 1 View  Result Date: 11/13/2021 CLINICAL DATA:  Provided history: Fever. EXAM: PORTABLE CHEST 1 VIEW COMPARISON:  CT angiogram chest 11/12/2021 prior chest radiographs 10/10/2021 and earlier. FINDINGS: There is significant patient rotation to the right. Heart size at the upper limits of normal. Aortic atherosclerosis. Mild prominence of the interstitial lung markings. No appreciable pleural effusion or evidence of pneumothorax. No acute bony abnormality identified. IMPRESSION: Mild prominence of the interstitial lung markings, which may reflect interstitial edema or possibly atypical/viral pneumonia. Aortic Atherosclerosis (ICD10-I70.0). Electronically  Signed   By: Jackey Loge D.O.   On: 11/13/2021 21:05   CT Maxillofacial Wo Contrast  Result Date: 11/13/2021 CLINICAL DATA:  Trauma. EXAM: CT HEAD WITHOUT CONTRAST CT MAXILLOFACIAL WITHOUT CONTRAST CT CERVICAL SPINE WITHOUT CONTRAST TECHNIQUE: Multidetector CT imaging of the head, cervical spine, and maxillofacial structures were performed using the standard protocol without intravenous contrast. Multiplanar CT image reconstructions of the cervical spine and maxillofacial structures were also generated. RADIATION DOSE REDUCTION: This exam was performed according to the departmental dose-optimization program which includes automated exposure control, adjustment of the mA and/or kV according to patient size and/or use of iterative reconstruction technique. COMPARISON:  Head CT dated 07/22/2020. FINDINGS: CT HEAD FINDINGS Brain: Mild age-related atrophy and moderate chronic microvascular ischemic changes. No acute intracranial hemorrhage. No  mass effect or midline shift. No extra-axial fluid collection. Vascular: No hyperdense vessel or unexpected calcification. Skull: Normal. Negative for fracture or focal lesion. Other: Contusion over the forehead. CT MAXILLOFACIAL FINDINGS Osseous: No acute fracture.  No mandibular dislocation. Orbits: The globes and retro-orbital fat are preserved. Sinuses: Diffuse mucoperiosteal thickening of paranasal sinuses with partial opacification of the right maxillary and sphenoid sinuses with air-fluid level. The mastoid air cells are clear. Soft tissues: Soft tissue contusion over the forehead. CT CERVICAL SPINE FINDINGS Alignment: No acute subluxation. Skull base and vertebrae: No acute cervical spine fracture. Age indeterminate mild compression fracture of superior endplate of T4. Correlation with clinical exam and point tenderness recommended. Soft tissues and spinal canal: No prevertebral fluid or swelling. No visible canal hematoma. Disc levels:  No acute findings.  Degenerative changes. Upper chest: Negative. Other: Bilateral carotid bulb calcified plaques. IMPRESSION: 1. No acute intracranial pathology. 2. No acute facial bone fractures. 3. No acute fracture or subluxation of the cervical spine. 4. Age indeterminate mild compression fracture of superior endplate of T4. Correlation with clinical exam and point tenderness recommended. Electronically Signed   By: Elgie Collard M.D.   On: 11/13/2021 22:07        Scheduled Meds: Continuous Infusions:  cefTRIAXone (ROCEPHIN)  IV 1 g (11/14/21 2045)   doxycycline (VIBRAMYCIN) IV Stopped (11/15/21 1040)   lactated ringers Stopped (11/15/21 1039)     LOS: 2 days    Alwyn Ren, MD 11/15/2021, 12:37 PM

## 2021-11-15 NOTE — Evaluation (Signed)
Occupational Therapy Evaluation Patient Details Name: Colleen Parker MRN: [id number] DOB: 1946-03-25 Today's Date: 11/15/2021   History of Present Illness Pt is a 76 y/o female admitted secondary to CAP, hypokalemia and fall. PMH includes dementia.   Clinical Impression   Patient admitted for th diagnosis above.  PTA she lives with her sister, who is having to provide increasing assistance with ADL/IADL and transfers.  Weakness and low back discomfort are the biggest deficits.  Currently she is needing up to Max A for all mobility, toileting and lower body ADL.  The sister would like to pursue a short SNF stay to improve strength and transfer ability.  Given Max A level, post acute rehab is reasonable.  OT will follow in the acute setting.         Recommendations for follow up therapy are one component of a multi-disciplinary discharge planning process, led by the attending physician.  Recommendations may be updated based on patient status, additional functional criteria and insurance authorization.   Follow Up Recommendations  Skilled nursing-short term rehab (<3 hours/day)    Assistance Recommended at Discharge Frequent or constant Supervision/Assistance  Patient can return home with the following A lot of help with bathing/dressing/bathroom;Two people to help with walking and/or transfers;Assistance with feeding;Help with stairs or ramp for entrance;Assist for transportation;Assistance with cooking/housework;Direct supervision/assist for financial management;Direct supervision/assist for medications management    Functional Status Assessment  Patient has had a recent decline in their functional status and demonstrates the ability to make significant improvements in function in a reasonable and predictable amount of time.  Equipment Recommendations  None recommended by OT    Recommendations for Other Services       Precautions / Restrictions Precautions Precautions:  Fall Restrictions Weight Bearing Restrictions: No      Mobility Bed Mobility Overal bed mobility: Needs Assistance Bed Mobility: Supine to Sit, Sit to Supine, Rolling Rolling: Mod assist   Supine to sit: Max assist Sit to supine: Max assist        Transfers Overall transfer level: Needs assistance   Transfers: Sit to/from Stand Sit to Stand: Max assist                  Balance Overall balance assessment: Needs assistance Sitting-balance support: Feet supported, Bilateral upper extremity supported Sitting balance-Leahy Scale: Poor   Postural control: Right lateral lean Standing balance support: Bilateral upper extremity supported Standing balance-Leahy Scale: Poor                             ADL either performed or assessed with clinical judgement   ADL       Grooming: Wash/dry hands;Wash/dry face;Supervision/safety;Bed level       Lower Body Bathing: Bed level;Maximal assistance   Upper Body Dressing : Minimal assistance;Bed level   Lower Body Dressing: Maximal assistance;Bed level   Toilet Transfer: Maximal assistance;Squat-pivot;BSC/3in1   Toileting- Clothing Manipulation and Hygiene: Maximal assistance;Bed level               Vision Baseline Vision/History: 4 Cataracts Patient Visual Report: No change from baseline       Perception     Praxis Praxis Praxis: Impaired Praxis Impairment Details: Initiation    Pertinent Vitals/Pain Pain Assessment Faces Pain Scale: Hurts a little bit Pain Location: generalized Pain Descriptors / Indicators: Grimacing, Guarding Pain Intervention(s): Monitored during session     Hand Dominance     Extremity/Trunk Assessment Upper Extremity  Assessment Upper Extremity Assessment: Generalized weakness   Lower Extremity Assessment Lower Extremity Assessment: Defer to PT evaluation   Cervical / Trunk Assessment Cervical / Trunk Assessment: Other exceptions Cervical / Trunk Exceptions:  curvature to the R in upper spine   Communication Communication Communication: No difficulties   Cognition Arousal/Alertness: Awake/alert Behavior During Therapy: WFL for tasks assessed/performed Overall Cognitive Status: History of cognitive impairments - at baseline                                 General Comments: Dementia at baseline     General Comments       Exercises     Shoulder Instructions      Home Living Family/patient expects to be discharged to:: Private residence Living Arrangements: Other relatives Available Help at Discharge: Family Type of Home: House Home Access: Ramped entrance     Home Layout: Two level;Able to live on main level with bedroom/bathroom     Bathroom Shower/Tub: Occupational psychologist: Standard     Home Equipment: Wheelchair - Publishing copy (2 wheels);BSC/3in1          Prior Functioning/Environment Prior Level of Function : Needs assist             Mobility Comments: Uses WC for longer distances and sister assists with ambulation inside of home ADLs Comments: Requires assist for ADL tasks        OT Problem List: Decreased strength;Decreased activity tolerance;Impaired balance (sitting and/or standing);Decreased safety awareness;Decreased cognition;Pain      OT Treatment/Interventions: Self-care/ADL training;Therapeutic activities;Therapeutic exercise;Balance training;Patient/family education    OT Goals(Current goals can be found in the care plan section) Acute Rehab OT Goals Patient Stated Goal: Be able to stand and transfer OT Goal Formulation: With patient/family Time For Goal Achievement: 11/28/21 Potential to Achieve Goals: Good ADL Goals Pt Will Perform Grooming: with supervision;sitting Pt Will Perform Upper Body Bathing: with supervision;sitting Pt Will Perform Upper Body Dressing: with supervision;sitting Pt Will Transfer to Toilet: with min assist;squat pivot  transfer;bedside commode Pt Will Perform Toileting - Clothing Manipulation and hygiene: with mod assist;sit to/from stand Pt/caregiver will Perform Home Exercise Program: Increased strength;Both right and left upper extremity;With theraputty;With minimal assist  OT Frequency: Min 2X/week    Co-evaluation              AM-PAC OT "6 Clicks" Daily Activity     Outcome Measure Help from another person eating meals?: A Little Help from another person taking care of personal grooming?: A Little Help from another person toileting, which includes using toliet, bedpan, or urinal?: A Lot Help from another person bathing (including washing, rinsing, drying)?: A Lot Help from another person to put on and taking off regular upper body clothing?: A Lot Help from another person to put on and taking off regular lower body clothing?: A Lot 6 Click Score: 14   End of Session Nurse Communication: Mobility status  Activity Tolerance: Patient tolerated treatment well Patient left: in bed;with call bell/phone within reach;with bed alarm set;with family/visitor present  OT Visit Diagnosis: Unsteadiness on feet (R26.81);Muscle weakness (generalized) (M62.81);History of falling (Z91.81);Other symptoms and signs involving cognitive function;Pain                Time: [id number] OT Time Calculation (min): 17 min Charges:  OT General Charges $OT Visit: 1 Visit OT Evaluation $OT Eval Moderate Complexity: 1 Mod  11/15/2021  RP,  OTR/L  Acute Rehabilitation Services  Office:  [place] 11/15/2021, 11:01 AM

## 2021-11-15 NOTE — Progress Notes (Signed)
Nutrition Brief Note  Patient identified on the Malnutrition Screening Tool (MST) Report  Wt Readings from Last 15 Encounters:  11/14/21 54.9 kg  11/12/21 51.7 kg  05/27/21 51.7 kg  05/15/21 54.5 kg  04/16/21 53.5 kg  08/23/20 54.5 kg   Colleen Parker is a 76 y.o. female with medical history significant of compression fractures in spine, dementia and recent UTI treated by PCP.  She lives with her sister.  Tonight her PCP called the sister and told her the urine culture indicated that patient will need IV antibiotics and to bring her to the emergency room.  She was treated last week for UTI with Macrobid which she 2 days left in the prescription.  Sister reports that she had a temperature of 100.7 degrees earlier today.  She also had a cough that was nonproductive.  Colleen Parker has not been complaining of pain with urination or frequent urination since she started the antibiotic therapy.  She was getting dressed to come to the emergency room she fell and hit her head on the floor.  She did not have loss of consciousness then no seizure-like activity.  When she fell her sister called EMS and had her brought to the emergency room.  In the emergency room patient has been hemodynamically stable and pleasant.  Chest x-ray reveals an atypical pneumonia versus interstitial edema that is mild.  Had mildly elevated white blood cell count and a fever at home so she was started on antibiotic therapy.  She was also found to have low potassium level with a normal magnesium level. Hospitalist service asked to admit for further management  Pt admitted with CAP and UTI.   Reviewed I/O's: +854 ml x 24 hours and +1.5 L since admission  UOP: 1.2 L x 24 hours  Pt unavailable at time of visit. Attempted to speak with pt via call to hospital room phone, however, unable to reach. RD unable to obtain further nutrition-related history or complete nutrition-focused physical exam at this time.    Labs reviewed: K: 3.4.    Current diet order is Heart Healthy, patient is consuming approximately 75% of meals at this time. Labs and medications reviewed.   No nutrition interventions warranted at this time. If nutrition issues arise, please consult RD.   Levada Schilling, RD, LDN, CDCES Registered Dietitian II Certified Diabetes Care and Education Specialist Please refer to [facility] for RD and/or RD on-call/weekend/after hours pager

## 2021-11-15 NOTE — NC FL2 (Signed)
[place] MEDICAID FL2 LEVEL OF CARE SCREENING TOOL     IDENTIFICATION  Patient Name: Colleen Parker Birthdate: [birth year] Sex: female Admission Date (Current Location): 11/13/2021  [facility] and [place] Number:  Producer, television/film/video and Address:  [facility], [address] [place], [place] [address]      Provider Number: [id number]  Attending Physician Name and Address:  Alwyn Ren, MD  Relative Name and Phone Number:  Suzanna Obey [phone]    Current Level of Care: Hospital Recommended Level of Care: Skilled Nursing Facility Prior Approval Number:    Date Approved/Denied:   PASRR Number: [id number]  Discharge Plan: SNF    Current Diagnoses: Patient Active Problem List   Diagnosis Date Noted   CAP (community acquired pneumonia) 11/13/2021   Hypokalemia 11/13/2021   Prolonged QT interval 11/13/2021   Dementia (HCC) 11/13/2021   Blood pressure elevated without history of HTN 11/13/2021   Traumatic hematoma of head 11/13/2021   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/2020    Orientation RESPIRATION BLADDER Height & Weight     Self, Place  Normal Incontinent Weight: 121 lb 0.5 oz (54.9 kg) Height:  5\' 3"  (160 cm)  BEHAVIORAL SYMPTOMS/MOOD NEUROLOGICAL BOWEL NUTRITION STATUS      Incontinent Diet (see discharge summary)  AMBULATORY STATUS COMMUNICATION OF NEEDS Skin   Extensive Assist Verbally Bruising (on face and eyes)                       Personal Care Assistance Level of Assistance  Bathing, Dressing Bathing Assistance: Maximum assistance   Dressing Assistance: Maximum assistance     Functional Limitations Info  Hearing   Hearing Info: Impaired      SPECIAL CARE FACTORS FREQUENCY  PT (By licensed PT), OT (By licensed OT)     PT Frequency: per facility OT Frequency: per facility            Contractures      Additional Factors Info  Code Status, Allergies Code  Status Info: FULL Allergies Info: Penicillins ,Quinolones, Sulfamethoxazole           Current Medications (11/15/2021):  This is the current hospital active medication list Current Facility-Administered Medications  Medication Dose Route Frequency Provider Last Rate Last Admin   acetaminophen (TYLENOL) tablet 650 mg  650 mg Oral Q6H PRN Chotiner, 11/17/2021, MD       Or   acetaminophen (TYLENOL) suppository 650 mg  650 mg Rectal Q6H PRN Chotiner, Claudean Severance, MD       albuterol (PROVENTIL) (2.5 MG/3ML) 0.083% nebulizer solution 2.5 mg  2.5 mg Nebulization Q4H PRN Chotiner, Claudean Severance, MD   2.5 mg at 11/14/21 1707   cefTRIAXone (ROCEPHIN) 1 g in sodium chloride 0.9 % 100 mL IVPB  1 g Intravenous Q24H Chotiner, 11/16/21, MD 200 mL/hr at 11/14/21 2045 1 g at 11/14/21 2045   doxycycline (VIBRAMYCIN) 100 mg in sodium chloride 0.9 % 250 mL IVPB  100 mg Intravenous Q12H Chotiner, 2046, MD   Paused at 11/15/21 1040   lactated ringers infusion   Intravenous Continuous Chotiner, 11/17/21, MD   Paused at 11/15/21 1039   senna-docusate (Senokot-S) tablet 1 tablet  1 tablet Oral QHS PRN Chotiner, 11/17/21, MD         Discharge Medications: Please see discharge summary for a list of discharge medications.  Relevant Imaging Results:  Relevant Lab Results:  Additional Information    Marya Landry Eliseo Withers, LCSWA

## 2021-11-15 NOTE — TOC Initial Note (Signed)
Transition of Care [facility]) - Initial/Assessment Note    Patient Details  Name: Colleen Parker MRN: [id number] Date of Birth: Sep 10, 1946  [facility]) CM/SW Contact:    Ina Homes, [place] Phone Number: 11/15/2021, 12:51 PM  Clinical Narrative:                  SW met with pt and sister Colleen Parker ([phone]) at bedside. [name] confirmed demographics. Pt/[name] report access to [facility], r/w, w/c, ramped entrance and they have ordered a hospital bed that should be here in a few weeks. Pt/[name] agreeable to SNF, prefer [facility] or [facility], [address],  [place], [place], [place] or [place].    Expected Discharge Plan: Skilled Nursing Facility Barriers to Discharge: Continued Medical Work up, SNF Pending bed offer   Patient Goals and CMS Choice Patient states their goals for this hospitalization and ongoing recovery are:: return home with sister following rehab CMS Medicare.gov Compare Post Acute Care list provided to:: Patient Represenative (must comment) (Sister) Choice offered to / list presented to : Patient, Sibling  Expected Discharge Plan and Services Expected Discharge Plan: [place] Acute Care Choice: [place] arrangements for the past 2 months: Single Family Home                                      Prior Living Arrangements/Services Living arrangements for the past 2 months: Single Family Home Lives with:: Siblings Patient language and need for interpreter reviewed:: Yes Do you feel safe going back to the place where you live?: Yes      Need for Family Participation in Patient Care: Yes (Comment) Care giver support system in place?: Yes (comment) Current home services: DME Criminal Activity/Legal Involvement Pertinent to Current Situation/Hospitalization: No - Comment as needed  Activities of Daily Living Home Assistive Devices/Equipment: Shower chair with back ADL Screening (condition at time  of admission) Patient's cognitive ability adequate to safely complete daily activities?: No Is the patient deaf or have difficulty hearing?: No Does the patient have difficulty seeing, even when wearing glasses/contacts?: No Does the patient have difficulty concentrating, remembering, or making decisions?: Yes Patient able to express need for assistance with ADLs?: No Does the patient have difficulty dressing or bathing?: Yes Independently performs ADLs?: No Communication: Independent Dressing (OT): Dependent Is this a change from baseline?: Pre-admission baseline Grooming: Dependent Is this a change from baseline?: Pre-admission baseline Feeding: Needs assistance Is this a change from baseline?: Pre-admission baseline Bathing: Dependent Is this a change from baseline?: Pre-admission baseline Toileting: Dependent, Needs assistance Is this a change from baseline?: Pre-admission baseline In/Out Bed: Needs assistance Is this a change from baseline?: Pre-admission baseline Walks in Home: Needs assistance Is this a change from baseline?: Pre-admission baseline Does the patient have difficulty walking or climbing stairs?: Yes Weakness of Legs: Both Weakness of Arms/Hands: Both  Permission Sought/Granted Permission sought to share information with : Case Manager, Customer service manager, Family Supports Permission granted to share information with : Yes, Verbal Permission Granted  Share Information with NAME: Colleen Parker  Permission granted to share info w AGENCY: SNF  Permission granted to share info w Relationship: sister  Permission granted to share info w Contact Information: [phone]  Emotional Assessment Appearance:: Appears stated age Attitude/Demeanor/Rapport: Unable to Assess Affect (typically observed): Accepting, Pleasant Orientation: : Oriented to Self, Oriented to  Place   Psych Involvement: No (comment)  Admission diagnosis:  Hypokalemia [E87.6] Prolonged Q-T  interval on ECG [R94.31] CAP (community acquired pneumonia) [J18.9] Contusion of forehead, initial encounter [S00.83XA] Patient Active Problem List   Diagnosis Date Noted   CAP (community acquired pneumonia) 11/13/2021   Hypokalemia 11/13/2021   Prolonged QT interval 11/13/2021   Dementia ([place]) 11/13/2021   Blood pressure elevated without history of HTN 11/13/2021   Traumatic hematoma of head 11/13/2021   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

## 2021-11-16 DIAGNOSIS — J189 Pneumonia, unspecified organism: Secondary | ICD-10-CM | POA: Diagnosis not present

## 2021-11-16 LAB — URINE CULTURE: Culture: 2000 — AB

## 2021-11-16 LAB — PROCALCITONIN: Procalcitonin: 0.52 ng/mL

## 2021-11-16 LAB — CBC
HCT: 37.7 % (ref 36.0–46.0)
Hemoglobin: 12.2 g/dL (ref 12.0–15.0)
MCH: 29.9 pg (ref 26.0–34.0)
MCHC: 32.4 g/dL (ref 30.0–36.0)
MCV: 92.4 fL (ref 80.0–100.0)
Platelets: 502 10*3/uL — ABNORMAL HIGH (ref 150–400)
RBC: 4.08 MIL/uL (ref 3.87–5.11)
RDW: 14.7 % (ref 11.5–15.5)
WBC: 10.9 10*3/uL — ABNORMAL HIGH (ref 4.0–10.5)
nRBC: 0 % (ref 0.0–0.2)

## 2021-11-16 LAB — BASIC METABOLIC PANEL
Anion gap: 11 (ref 5–15)
BUN: 6 mg/dL — ABNORMAL LOW (ref 8–23)
CO2: 20 mmol/L — ABNORMAL LOW (ref 22–32)
Calcium: 9.3 mg/dL (ref 8.9–10.3)
Chloride: 104 mmol/L (ref 98–111)
Creatinine, Ser: 0.54 mg/dL (ref 0.44–1.00)
GFR, Estimated: >60 mL/min (ref >60)
Glucose, Bld: 94 mg/dL (ref 70–99)
Potassium: 4.1 mmol/L (ref 3.5–5.1)
Sodium: 135 mmol/L (ref 135–145)

## 2021-11-16 MED ORDER — POTASSIUM CHLORIDE CRYS ER 20 MEQ PO TBCR
40.0000 meq | EXTENDED_RELEASE_TABLET | Freq: Once | ORAL | Status: AC
  Administered 2021-11-16: 40 meq via ORAL
  Filled 2021-11-16: qty 2

## 2021-11-16 NOTE — Progress Notes (Signed)
PROGRESS NOTE    Colleen Parker  ZOX:[id number] DOB: 07/06/1946 DOA: 11/13/2021 PCP: Lula Olszewskieddy, Mahitha, MD  Brief Narrative: 76 year old female history of dementia lives at home with her sister, multiple compression fractures of the spine, brought in after a fall at home.  Patient has been treated as an outpatient for urinary tract infection with Macrodantin.  Patient denies any urinary symptoms at this time.  She denies a syncopal episode.  She also has complaints of cough and shortness of breath and is found to have leukocytosis and fever.  She is admitted for community-acquired pneumonia.   Assessment & Plan:   Principal Problem:   CAP (community acquired pneumonia) Active Problems:   Hypokalemia   Prolonged QT interval   Dementia (HCC)   Blood pressure elevated without history of HTN   Traumatic hematoma of head  #1 urinary tract infection being treated as an outpatient.  I discussed with her primary physician who did tell me that patient had Staph epidermidis in her urine culture sensitive to Macrobid Bactrim and vancomycin.  She has 2 more tablets of Macrobid to finish her course will continue and finish the course.  #2 community-acquired pneumonia-patient has complaints of cough and shortness of breath.  She has been started on doxycycline and Rocephin.   Chest x-ray shows findings represent atypical pneumonia versus fluid.   We will continue antibiotics for now. She is on room air with normal saturation at rest.   Leukocytosis 9.8 from11.0 down from 12.3. Procalcitonin elevated at 6.4  Seen by PT recommends SNF   #3 status post fall   CT head no acute findings.  Check orthostatics.  #4 hypokalemia replete mag level 1.9  Estimated body mass index is 21.44 kg/m as calculated from the following:   Height as of this encounter: 5\' 3"  (1.6 m).   Weight as of this encounter: 54.9 kg.  DVT prophylaxis: Lovenox  code Status: Full code  family Communication: Discussed with her sister  at bedside  disposition Plan:  Status is: Inpatient Remains inpatient appropriate because: Patient admitted with fever shortness of breath and cough and community-acquired pneumonia and recent fall.  PT evaluation recommends SNF    Consultants:  None  Procedures none Antimicrobials: Rocephin and doxycycline  Subjective: Patient is resting in bed she reports having a better night last night she feels her breathing is better sister is by the bedside  Objective: Vitals:   11/15/21 2042 11/15/21 2109 11/16/21 0552 11/16/21 0919  BP: (!) 153/75  (!) 164/74 (!) 145/61  Pulse: (!) 107  94 (!) 104  Resp: 18  20 18   Temp: 97.9 F (36.6 C)  97.6 F (36.4 C) 97.7 F (36.5 C)  TempSrc: Oral  Oral Oral  SpO2: 94% 95% 94% 96%  Weight:      Height:        Intake/Output Summary (Last 24 hours) at 11/16/2021 1231 Last data filed at 11/16/2021 0900 Gross per 24 hour  Intake 1270.77 ml  Output 1 ml  Net 1269.77 ml    Filed Weights   11/14/21 1648  Weight: 54.9 kg    Examination:  General exam: Appears frail elderly chronically ill looking  respiratory system rhonchi to auscultation. Respiratory effort normal. Cardiovascular system: S1 & S2 heard, RRR. No JVD, murmurs, rubs, gallops or clicks. No pedal edema. Gastrointestinal system: Abdomen is nondistended, soft and nontender. No organomegaly or masses felt. Normal bowel sounds heard. Central nervous system: Alert and oriented. No focal neurological deficits. Extremities: Symmetric  5 x 5 power. Skin: No rashes, lesions or ulcers Psychiatry: Judgement and insight appear normal. Mood & affect appropriate.     Data Reviewed: I have personally reviewed following labs and imaging studies  CBC: Recent Labs  Lab 11/13/21 2002 11/14/21 0426 11/15/21 0736  WBC 12.3* 11.0* 9.8  NEUTROABS 9.2*  --   --   HGB 11.5* 12.0 11.8*  HCT 36.4 39.1 36.0  MCV 93.1 95.1 91.8  PLT 462* 490* 445*    Basic Metabolic Panel: Recent Labs   Lab 11/13/21 2002 11/14/21 0426 11/14/21 1706 11/15/21 0736  NA 137 142  --  138  K 2.9* 3.3*  --  3.4*  CL 102 108  --  106  CO2 25 24  --  21*  GLUCOSE 119* 83  --  90  BUN 7* 5*  --  5*  CREATININE 0.71 0.59  --  0.55  CALCIUM 9.1 9.5  --  9.1  MG 1.9  --  1.9  --     GFR: Estimated Creatinine Clearance: 51.8 mL/min (by C-G formula based on SCr of 0.55 mg/dL). Liver Function Tests: Recent Labs  Lab 11/13/21 2002  AST 17  ALT 10  ALKPHOS 95  BILITOT 0.6  PROT 6.6  ALBUMIN 2.9*    No results for input(s): LIPASE, AMYLASE in the last 168 hours. No results for input(s): AMMONIA in the last 168 hours. Coagulation Profile: No results for input(s): INR, PROTIME in the last 168 hours. Cardiac Enzymes: No results for input(s): CKTOTAL, CKMB, CKMBINDEX, TROPONINI in the last 168 hours. BNP (last 3 results) No results for input(s): PROBNP in the last 8760 hours. HbA1C: No results for input(s): HGBA1C in the last 72 hours. CBG: No results for input(s): GLUCAP in the last 168 hours. Lipid Profile: No results for input(s): CHOL, HDL, LDLCALC, TRIG, CHOLHDL, LDLDIRECT in the last 72 hours. Thyroid Function Tests: No results for input(s): TSH, T4TOTAL, FREET4, T3FREE, THYROIDAB in the last 72 hours. Anemia Panel: No results for input(s): VITAMINB12, FOLATE, FERRITIN, TIBC, IRON, RETICCTPCT in the last 72 hours. Sepsis Labs: Recent Labs  Lab 11/13/21 2002 11/14/21 1706  PROCALCITON  --  6.40  LATICACIDVEN 1.8  --      Recent Results (from the past 240 hour(s))  Urine Culture     Status: Abnormal   Collection Time: 11/13/21  7:45 PM   Specimen: Urine, Catheterized  Result Value Ref Range Status   Specimen Description URINE, CATHETERIZED  Final   Special Requests   Final    NONE Performed at [facility], [address] [place], [place] [address]    Culture 2,000 COLONIES/mL PROTEUS MIRABILIS (A)  Final   Report Status 11/16/2021 FINAL  Final   Organism  ID, Bacteria PROTEUS MIRABILIS (A)  Final      Susceptibility   Proteus mirabilis - MIC*    AMPICILLIN <=2 SENSITIVE Sensitive     CEFAZOLIN <=4 SENSITIVE Sensitive     CEFEPIME <=0.12 SENSITIVE Sensitive     CEFTRIAXONE <=0.25 SENSITIVE Sensitive     CIPROFLOXACIN <=0.25 SENSITIVE Sensitive     GENTAMICIN <=1 SENSITIVE Sensitive     IMIPENEM 2 SENSITIVE Sensitive     NITROFURANTOIN 128 RESISTANT Resistant     TRIMETH/SULFA <=20 SENSITIVE Sensitive     AMPICILLIN/SULBACTAM <=2 SENSITIVE Sensitive     PIP/TAZO <=4 SENSITIVE Sensitive     * 2,000 COLONIES/mL PROTEUS MIRABILIS  Resp Panel by RT-PCR (Flu A&B, Covid)  Status: None   Collection Time: 11/13/21  7:51 PM   Specimen: Nasopharyngeal(NP) swabs in vial transport medium  Result Value Ref Range Status   SARS Coronavirus 2 by RT PCR NEGATIVE NEGATIVE Final    Comment: (NOTE) SARS-CoV-2 target nucleic acids are NOT DETECTED.  The SARS-CoV-2 RNA is generally detectable in upper respiratory specimens during the acute phase of infection. The lowest concentration of SARS-CoV-2 viral copies this assay can detect is 138 copies/mL. A negative result does not preclude SARS-Cov-2 infection and should not be used as the sole basis for treatment or other patient management decisions. A negative result may occur with  improper specimen collection/handling, submission of specimen other than nasopharyngeal swab, presence of viral mutation(s) within the areas targeted by this assay, and inadequate number of viral copies(<138 copies/mL). A negative result must be combined with clinical observations, patient history, and epidemiological information. The expected result is Negative.  Fact Sheet for Patients:  BloggerCourse.com  Fact Sheet for Healthcare Providers:  SeriousBroker.it  This test is no t yet approved or cleared by the [place] FDA and  has been authorized for detection  and/or diagnosis of SARS-CoV-2 by FDA under an Emergency Use Authorization (EUA). This EUA will remain  in effect (meaning this test can be used) for the duration of the COVID-19 declaration under Section 564(b)(1) of the Act, 21 U.S.C.section 360bbb-3(b)(1), unless the authorization is terminated  or revoked sooner.       Influenza A by PCR NEGATIVE NEGATIVE Final   Influenza B by PCR NEGATIVE NEGATIVE Final    Comment: (NOTE) The Xpert Xpress SARS-CoV-2/FLU/RSV plus assay is intended as an aid in the diagnosis of influenza from Nasopharyngeal swab specimens and should not be used as a sole basis for treatment. Nasal washings and aspirates are unacceptable for Xpert Xpress SARS-CoV-2/FLU/RSV testing.  Fact Sheet for Patients: BloggerCourse.com  Fact Sheet for Healthcare Providers: SeriousBroker.it  This test is not yet approved or cleared by the [place] FDA and has been authorized for detection and/or diagnosis of SARS-CoV-2 by FDA under an Emergency Use Authorization (EUA). This EUA will remain in effect (meaning this test can be used) for the duration of the COVID-19 declaration under Section 564(b)(1) of the Act, 21 U.S.C. section 360bbb-3(b)(1), unless the authorization is terminated or revoked.  Performed at [facility], [address], [place], [place] [address]   Culture, blood (routine x 2)     Status: None (Preliminary result)   Collection Time: 11/13/21  8:02 PM   Specimen: BLOOD LEFT ARM  Result Value Ref Range Status   Specimen Description BLOOD LEFT ARM  Final   Special Requests   Final    BOTTLES DRAWN AEROBIC AND ANAEROBIC Blood Culture adequate volume   Culture   Final    NO GROWTH 3 DAYS Performed at [facility], [address], [place], [place] [address]    Report Status PENDING  Incomplete  Culture, blood (routine x 2)     Status: None (Preliminary result)   Collection Time:  11/13/21  9:23 PM   Specimen: BLOOD LEFT ARM  Result Value Ref Range Status   Specimen Description BLOOD LEFT ARM  Final   Special Requests   Final    BOTTLES DRAWN AEROBIC AND ANAEROBIC Blood Culture results may not be optimal due to an inadequate volume of blood received in culture bottles   Culture   Final    NO GROWTH 3 DAYS Performed at [facility], [address], [place], [place] [address]    Report Status PENDING  Incomplete          Radiology Studies: No results found.      Scheduled Meds:  doxycycline  100 mg Oral Q12H   Continuous Infusions:  cefTRIAXone (ROCEPHIN)  IV Stopped (11/15/21 2140)   lactated ringers 50 mL/hr at 11/16/21 0140     LOS: 3 days    Alwyn Ren, MD 11/16/2021, 12:31 PM

## 2021-11-16 NOTE — Plan of Care (Signed)
°  Problem: Respiratory: °Goal: Ability to maintain a clear airway will improve °Outcome: Progressing °  °

## 2021-11-16 NOTE — Plan of Care (Signed)
  Problem: Activity: Goal: Ability to tolerate increased activity will improve Outcome: Progressing   Problem: Clinical Measurements: Goal: Ability to maintain a body temperature in the normal range will improve Outcome: Progressing   Problem: Respiratory: Goal: Ability to maintain adequate ventilation will improve Outcome: Progressing Goal: Ability to maintain a clear airway will improve Outcome: Progressing   

## 2021-11-17 ENCOUNTER — Other Ambulatory Visit (HOSPITAL_COMMUNITY): Payer: Self-pay

## 2021-11-17 DIAGNOSIS — J189 Pneumonia, unspecified organism: Secondary | ICD-10-CM | POA: Diagnosis not present

## 2021-11-17 LAB — CBC
HCT: 36.6 % (ref 36.0–46.0)
Hemoglobin: 11.6 g/dL — ABNORMAL LOW (ref 12.0–15.0)
MCH: 28.9 pg (ref 26.0–34.0)
MCHC: 31.7 g/dL (ref 30.0–36.0)
MCV: 91.3 fL (ref 80.0–100.0)
Platelets: 493 10*3/uL — ABNORMAL HIGH (ref 150–400)
RBC: 4.01 MIL/uL (ref 3.87–5.11)
RDW: 14.6 % (ref 11.5–15.5)
WBC: 11.7 10*3/uL — ABNORMAL HIGH (ref 4.0–10.5)
nRBC: 0 % (ref 0.0–0.2)

## 2021-11-17 LAB — BASIC METABOLIC PANEL
Anion gap: 8 (ref 5–15)
BUN: 6 mg/dL — ABNORMAL LOW (ref 8–23)
CO2: 24 mmol/L (ref 22–32)
Calcium: 9.4 mg/dL (ref 8.9–10.3)
Chloride: 106 mmol/L (ref 98–111)
Creatinine, Ser: 0.55 mg/dL (ref 0.44–1.00)
GFR, Estimated: >60 mL/min (ref >60)
Glucose, Bld: 105 mg/dL — ABNORMAL HIGH (ref 70–99)
Potassium: 4.2 mmol/L (ref 3.5–5.1)
Sodium: 138 mmol/L (ref 135–145)

## 2021-11-17 LAB — RESP PANEL BY RT-PCR (FLU A&B, COVID) ARPGX2
Influenza A by PCR: NEGATIVE
Influenza B by PCR: NEGATIVE
SARS Coronavirus 2 by RT PCR: NEGATIVE

## 2021-11-17 MED ORDER — BISACODYL 5 MG PO TBEC
10.0000 mg | DELAYED_RELEASE_TABLET | Freq: Once | ORAL | Status: AC
  Administered 2021-11-17: 10 mg via ORAL
  Filled 2021-11-17: qty 2

## 2021-11-17 MED ORDER — CEFDINIR 300 MG PO CAPS
300.0000 mg | ORAL_CAPSULE | Freq: Two times a day (BID) | ORAL | 0 refills | Status: DC
  Filled 2021-11-17: qty 4, 2d supply, fill #0

## 2021-11-17 MED ORDER — SENNOSIDES-DOCUSATE SODIUM 8.6-50 MG PO TABS: 1.0000 | ORAL_TABLET | Freq: Every evening | ORAL | Status: DC | PRN

## 2021-11-17 NOTE — Care Management Important Message (Signed)
Important Message  Patient Details  Name: Colleen Parker MRN: [id number] Date of Birth: [birth year]   Medicare Important Message Given:  Yes     Dorena Bodo 11/17/2021, 3:15 PM

## 2021-11-17 NOTE — Progress Notes (Signed)
PROGRESS NOTE    Colleen Parker  [id number] DOB: [birth year] DOA: 11/13/2021 PCP: Margretta Sidle, MD  Brief Narrative: 76 year old female history of dementia lives at home with her sister, multiple compression fractures of the spine, brought in after a fall at home.  Patient has been treated as an outpatient for urinary tract infection with Macrodantin.  Patient denies any urinary symptoms at this time.  She denies a syncopal episode.  She also has complaints of cough and shortness of breath and is found to have leukocytosis and fever.  She is admitted for community-acquired pneumonia.   Assessment & Plan:   Principal Problem:   CAP (community acquired pneumonia) Active Problems:   Hypokalemia   Prolonged QT interval   Dementia (HCC)   Blood pressure elevated without history of HTN   Traumatic hematoma of head  #1 urinary tract infection being treated as an outpatient.  I discussed with her primary physician who did tell me that patient had Staph epidermidis in her urine culture sensitive to Macrobid Bactrim and vancomycin.  She has 2 more tablets of Macrobid to finish her course will continue and finish the course.  #2 community-acquired pneumonia-patient has complaints of cough and shortness of breath.  She has been started on doxycycline and Rocephin.   Chest x-ray shows findings represent atypical pneumonia versus fluid.   We will continue antibiotics for now. She is on room air with normal saturation at rest.   Leukocytosis 9.8 from11.0 down from 12.3. Procalcitonin elevated at 6.4  Seen by PT recommends SNF   #3 status post fall   CT head no acute findings..  #4 hypokalemia resolved  #5 constipation Start stool softeners patient take stool softeners at home on a as needed basis.  Estimated body mass index is 21.44 kg/m as calculated from the following:   Height as of this encounter: 5\' 3"  (1.6 m).   Weight as of this encounter: 54.9 kg.  DVT prophylaxis: Lovenox  code  Status: Full code  family Communication: Discussed with her sister at bedside  disposition Plan:  Status is: Inpatient Remains inpatient appropriate because: [place] TO SNF   Consultants:  None  Procedures none Antimicrobials: Rocephin and doxycycline  Subjective: Patient resting in bed sister by the bedside Feels her breathing and cough are better Objective: Vitals:   11/16/21 2008 11/16/21 2029 11/17/21 0508 11/17/21 0924  BP:  (!) 156/86 (!) 157/87 119/72  Pulse:  (!) 104 99 97  Resp:  17 16 19   Temp:  98.5 F (36.9 C) 98.3 F (36.8 C) 97.8 F (36.6 C)  TempSrc:      SpO2: 97% 96% 95% 96%  Weight:      Height:        Intake/Output Summary (Last 24 hours) at 11/17/2021 1241 Last data filed at 11/17/2021 0830 Gross per 24 hour  Intake 720 ml  Output 2750 ml  Net -2030 ml    Filed Weights   11/14/21 1648  Weight: 54.9 kg    Examination:  General exam: Appears frail elderly chronically ill looking  respiratory system rhonchi to auscultation. Respiratory effort normal. Cardiovascular system: S1 & S2 heard, RRR. No JVD, murmurs, rubs, gallops or clicks. No pedal edema. Gastrointestinal system: Abdomen is nondistended, soft and nontender. No organomegaly or masses felt. Normal bowel sounds heard. Central nervous system: Alert and oriented. No focal neurological deficits. Extremities: Symmetric 5 x 5 power. Skin: No rashes, lesions or ulcers Psychiatry: Judgement and insight appear normal.  Mood & affect appropriate.     Data Reviewed: I have personally reviewed following labs and imaging studies  CBC: Recent Labs  Lab 11/13/21 2002 11/14/21 0426 11/15/21 0736 11/16/21 1243 11/17/21 0617  WBC 12.3* 11.0* 9.8 10.9* 11.7*  NEUTROABS 9.2*  --   --   --   --   HGB 11.5* 12.0 11.8* 12.2 11.6*  HCT 36.4 39.1 36.0 37.7 36.6  MCV 93.1 95.1 91.8 92.4 91.3  PLT 462* 490* 445* 502* 493*    Basic Metabolic Panel: Recent Labs  Lab 11/13/21 2002  11/14/21 0426 11/14/21 1706 11/15/21 0736 11/16/21 1243 11/17/21 0617  NA 137 142  --  138 135 138  K 2.9* 3.3*  --  3.4* 4.1 4.2  CL 102 108  --  106 104 106  CO2 25 24  --  21* 20* 24  GLUCOSE 119* 83  --  90 94 105*  BUN 7* 5*  --  5* 6* 6*  CREATININE 0.71 0.59  --  0.55 0.54 0.55  CALCIUM 9.1 9.5  --  9.1 9.3 9.4  MG 1.9  --  1.9  --   --   --     GFR: Estimated Creatinine Clearance: 51.8 mL/min (by C-G formula based on SCr of 0.55 mg/dL). Liver Function Tests: Recent Labs  Lab 11/13/21 2002  AST 17  ALT 10  ALKPHOS 95  BILITOT 0.6  PROT 6.6  ALBUMIN 2.9*    No results for input(s): LIPASE, AMYLASE in the last 168 hours. No results for input(s): AMMONIA in the last 168 hours. Coagulation Profile: No results for input(s): INR, PROTIME in the last 168 hours. Cardiac Enzymes: No results for input(s): CKTOTAL, CKMB, CKMBINDEX, TROPONINI in the last 168 hours. BNP (last 3 results) No results for input(s): PROBNP in the last 8760 hours. HbA1C: No results for input(s): HGBA1C in the last 72 hours. CBG: No results for input(s): GLUCAP in the last 168 hours. Lipid Profile: No results for input(s): CHOL, HDL, LDLCALC, TRIG, CHOLHDL, LDLDIRECT in the last 72 hours. Thyroid Function Tests: No results for input(s): TSH, T4TOTAL, FREET4, T3FREE, THYROIDAB in the last 72 hours. Anemia Panel: No results for input(s): VITAMINB12, FOLATE, FERRITIN, TIBC, IRON, RETICCTPCT in the last 72 hours. Sepsis Labs: Recent Labs  Lab 11/13/21 2002 11/14/21 1706 11/16/21 1243  PROCALCITON  --  6.40 0.52  LATICACIDVEN 1.8  --   --      Recent Results (from the past 240 hour(s))  Urine Culture     Status: Abnormal   Collection Time: 11/13/21  7:45 PM   Specimen: Urine, Catheterized  Result Value Ref Range Status   Specimen Description URINE, CATHETERIZED  Final   Special Requests   Final    NONE Performed at [facility], [address], [place], [place] [address]     Culture 2,000 COLONIES/mL PROTEUS MIRABILIS (A)  Final   Report Status 11/16/2021 FINAL  Final   Organism ID, Bacteria PROTEUS MIRABILIS (A)  Final      Susceptibility   Proteus mirabilis - MIC*    AMPICILLIN <=2 SENSITIVE Sensitive     CEFAZOLIN <=4 SENSITIVE Sensitive     CEFEPIME <=0.12 SENSITIVE Sensitive     CEFTRIAXONE <=0.25 SENSITIVE Sensitive     CIPROFLOXACIN <=0.25 SENSITIVE Sensitive     GENTAMICIN <=1 SENSITIVE Sensitive     IMIPENEM 2 SENSITIVE Sensitive     NITROFURANTOIN 128 RESISTANT Resistant     TRIMETH/SULFA <=20 SENSITIVE Sensitive  AMPICILLIN/SULBACTAM <=2 SENSITIVE Sensitive     PIP/TAZO <=4 SENSITIVE Sensitive     * 2,000 COLONIES/mL PROTEUS MIRABILIS  Resp Panel by RT-PCR (Flu A&B, Covid)     Status: None   Collection Time: 11/13/21  7:51 PM   Specimen: Nasopharyngeal(NP) swabs in vial transport medium  Result Value Ref Range Status   SARS Coronavirus 2 by RT PCR NEGATIVE NEGATIVE Final    Comment: (NOTE) SARS-CoV-2 target nucleic acids are NOT DETECTED.  The SARS-CoV-2 RNA is generally detectable in upper respiratory specimens during the acute phase of infection. The lowest concentration of SARS-CoV-2 viral copies this assay can detect is 138 copies/mL. A negative result does not preclude SARS-Cov-2 infection and should not be used as the sole basis for treatment or other patient management decisions. A negative result may occur with  improper specimen collection/handling, submission of specimen other than nasopharyngeal swab, presence of viral mutation(s) within the areas targeted by this assay, and inadequate number of viral copies(<138 copies/mL). A negative result must be combined with clinical observations, patient history, and epidemiological information. The expected result is Negative.  Fact Sheet for Patients:  EntrepreneurPulse.com.au  Fact Sheet for Healthcare Providers:   IncredibleEmployment.be  This test is no t yet approved or cleared by the [place] FDA and  has been authorized for detection and/or diagnosis of SARS-CoV-2 by FDA under an Emergency Use Authorization (EUA). This EUA will remain  in effect (meaning this test can be used) for the duration of the COVID-19 declaration under Section 564(b)(1) of the Act, 21 U.S.C.section 360bbb-3(b)(1), unless the authorization is terminated  or revoked sooner.       Influenza A by PCR NEGATIVE NEGATIVE Final   Influenza B by PCR NEGATIVE NEGATIVE Final    Comment: (NOTE) The Xpert Xpress SARS-CoV-2/FLU/RSV plus assay is intended as an aid in the diagnosis of influenza from Nasopharyngeal swab specimens and should not be used as a sole basis for treatment. Nasal washings and aspirates are unacceptable for Xpert Xpress SARS-CoV-2/FLU/RSV testing.  Fact Sheet for Patients: EntrepreneurPulse.com.au  Fact Sheet for Healthcare Providers: IncredibleEmployment.be  This test is not yet approved or cleared by the [place] FDA and has been authorized for detection and/or diagnosis of SARS-CoV-2 by FDA under an Emergency Use Authorization (EUA). This EUA will remain in effect (meaning this test can be used) for the duration of the COVID-19 declaration under Section 564(b)(1) of the Act, 21 U.S.C. section 360bbb-3(b)(1), unless the authorization is terminated or revoked.  Performed at [facility], [place] [address], [place], [place] [address]   Culture, blood (routine x 2)     Status: None (Preliminary result)   Collection Time: 11/13/21  8:02 PM   Specimen: BLOOD LEFT ARM  Result Value Ref Range Status   Specimen Description BLOOD LEFT ARM  Final   Special Requests   Final    BOTTLES DRAWN AEROBIC AND ANAEROBIC Blood Culture adequate volume   Culture   Final    NO GROWTH 4 DAYS Performed at [facility], [place] [address]  [place], [place] [address]    Report Status PENDING  Incomplete  Culture, blood (routine x 2)     Status: None (Preliminary result)   Collection Time: 11/13/21  9:23 PM   Specimen: BLOOD LEFT ARM  Result Value Ref Range Status   Specimen Description BLOOD LEFT ARM  Final   Special Requests   Final    BOTTLES DRAWN AEROBIC AND ANAEROBIC Blood Culture  results may not be optimal due to an inadequate volume of blood received in culture bottles   Culture   Final    NO GROWTH 4 DAYS Performed at [facility], [place] [address], [place], [place] [address]    Report Status PENDING  Incomplete          Radiology Studies: No results found.      Scheduled Meds:  doxycycline  100 mg Oral Q12H   Continuous Infusions:  cefTRIAXone (ROCEPHIN)  IV 1 g (11/16/21 2109)   lactated ringers 50 mL/hr at 11/16/21 2106     LOS: 4 days    Georgette Shell, MD 11/17/2021, 12:41 PM

## 2021-11-17 NOTE — Progress Notes (Signed)
Physical Therapy Treatment Patient Details Name: Colleen Parker MRN: [id number] DOB: 05/24/1946 Today's Date: 11/17/2021   History of Present Illness Pt is a 76 y/o female admitted secondary to CAP, hypokalemia and fall. PMH includes dementia.    PT Comments    Continuing work on functional mobility and activity tolerance;  Noteworthy improvements in bed mobility and the ability to transfer OOB to the recliner; Mod assist to roll and push up sidelying to sit; Sat up initially with min assist, but noted fatigues into incr trunk flexion with incr time sitting EOB; Mod/Max assist with sit to stand and stand pivot transfers; Much better pivot transfer, and sister reports that is similar to how they do it at home; Unable to get a straightforward set of orthostatic BPs, but serial BPs are as follows:   Supine 138/74, HR 94 Sitting   119/79, HR 103 Sitting in recliner post stand pivot transfer               99/63, HR 76 High sitting in stedy, for semi-standing BP                110/86, HR 76  Overall progressing well; Continue to recommend post-acute rehab to maximize independence and safety with mobility and ADLs and decr caregiver burden for when she gets back home; Anticipate continuing good progress at post-acute rehabilitation;   Recommend pt's family look into personal care attendant to help with caregiving and respite care once she is home.             Recommendations for follow up therapy are one component of a multi-disciplinary discharge planning process, led by the attending physician.  Recommendations may be updated based on patient status, additional functional criteria and insurance authorization.  Follow Up Recommendations  Skilled nursing-short term rehab (<3 hours/day)     Assistance Recommended at Discharge Frequent or constant Supervision/Assistance  Patient can return home with the following A lot of help with walking and/or transfers;A lot of help with  bathing/dressing/bathroom;Help with stairs or ramp for entrance;Assist for transportation;Assistance with cooking/housework;Direct supervision/assist for financial management;Direct supervision/assist for medications management   Equipment Recommendations  Other (comment) (hoyer lift and pad; reports hospital bed is supposed to come next week)    Recommendations for Other Services       Precautions / Restrictions Precautions Precautions: Fall     Mobility  Bed Mobility Overal bed mobility: Needs Assistance Bed Mobility: Rolling, Sidelying to Sit Rolling: Mod assist Sidelying to sit: Mod assist       General bed mobility comments: Incr time and slow moving, Multimodal cues for technique; seemed better able to help with bed mobility    Transfers Overall transfer level: Needs assistance Equipment used: 1 person hand held assist, Ambulation equipment used Transfers: Bed to chair/wheelchair/BSC, Sit to/from Stand Sit to Stand: Max assist Stand pivot transfers: Max assist         General transfer comment: Performed basic stand pivot transfer bed to chair with knees closely guarded for stability; noting improved forward lean and push through LEs for stand pivot to recliner; Max assist to lean forward and reach for bar to stand with stedy, but with good push through feet    Ambulation/Gait                   Stairs             Wheelchair Mobility    Modified Rankin (Stroke Patients Only)  Balance     Sitting balance-Leahy Scale: Poor Sitting balance - Comments: Mod assist     Standing balance-Leahy Scale: Poor                              Cognition Arousal/Alertness: Awake/alert Behavior During Therapy: WFL for tasks assessed/performed Overall Cognitive Status: History of cognitive impairments - at baseline                                 General Comments: Dementia at baseline        Exercises      General  Comments General comments (skin integrity, edema, etc.): Pt's sister present and helpful during session; We discussed recommendation for SNF for post-acute rehab      Pertinent Vitals/Pain Pain Assessment Pain Assessment: Faces Faces Pain Scale: Hurts a little bit Pain Location: generalized Pain Descriptors / Indicators: Grimacing, Guarding Pain Intervention(s): Monitored during session    Home Living                          Prior Function            PT Goals (current goals can now be found in the care plan section) Acute Rehab PT Goals Patient Stated Goal: to go home PT Goal Formulation: With patient/family Time For Goal Achievement: 11/28/21 Potential to Achieve Goals: Good Progress towards PT goals: Progressing toward goals    Frequency    Min 2X/week      PT Plan Current plan remains appropriate;Frequency needs to be updated    Co-evaluation              AM-PAC PT "6 Clicks" Mobility   Outcome Measure  Help needed turning from your back to your side while in a flat bed without using bedrails?: A Lot Help needed moving from lying on your back to sitting on the side of a flat bed without using bedrails?: A Lot Help needed moving to and from a bed to a chair (including a wheelchair)?: A Lot Help needed standing up from a chair using your arms (e.g., wheelchair or bedside chair)?: A Lot Help needed to walk in hospital room?: Total Help needed climbing 3-5 steps with a railing? : Total 6 Click Score: 10    End of Session Equipment Utilized During Treatment: Gait belt Activity Tolerance: Patient tolerated treatment well Patient left: in chair;with call bell/phone within reach;with chair alarm set;with family/visitor present Nurse Communication: Mobility status PT Visit Diagnosis: Other abnormalities of gait and mobility (R26.89);Unsteadiness on feet (R26.81);Muscle weakness (generalized) (M62.81);Difficulty in walking, not elsewhere classified  (R26.2)     Time: 8756-4332 PT Time Calculation (min) (ACUTE ONLY): 43 min  Charges:  $Therapeutic Activity: 38-52 mins                     Van Clines, PT  Acute Rehabilitation Services Pager [phone] Office [phone]    Levi Aland 11/17/2021, 12:35 PM

## 2021-11-17 NOTE — Discharge Summary (Signed)
Physician Discharge Summary  HIMANI CORONA ZOX:[id number] DOB: 02/18/1946 DOA: 11/13/2021  PCP: Lula Olszewski, MD  Admit date: 11/13/2021 Discharge date: 11/17/2021  Admitted From: Home Disposition: Nursing home  Recommendations for Outpatient Follow-up:  Follow up with PCP in 1-2 weeks Please obtain BMP/CBC in one week  Home Health: None Equipment/Devices: None  Discharge Condition: Stable CODE STATUS: Full code Diet recommendation: Cardiac diet Brief/Interim Summary:  76 year old female history of dementia lives at home with her sister, multiple compression fractures of the spine, brought in after a fall at home.  Patient has been treated as an outpatient for urinary tract infection with Macrodantin.  Patient denies any urinary symptoms at this time.  She denies a syncopal episode.  She also has complaints of cough and shortness of breath and is found to have leukocytosis and fever.  She is admitted for community-acquired pneumonia.  Discharge Diagnoses:  Principal Problem:   CAP (community acquired pneumonia) Active Problems:   Hypokalemia   Prolonged QT interval   Dementia (HCC)   Blood pressure elevated without history of HTN   Traumatic hematoma of head     #1 urinary tract infection being treated as an outpatient.  I discussed with her primary physician who did tell me that patient had Staph epidermidis in her urine culture sensitive to Macrobid Bactrim and vancomycin.  She has 2 more tablets of Macrobid to finish her course will continue and finish the course.   #2 community-acquired pneumonia-patient has complaints of cough and shortness of breath.  She has been started on doxycycline and Rocephin.   Chest x-ray shows findings represent atypical pneumonia versus fluid.  We will discharge her on Augmentin for 2 more days.  She is on room air with normal saturation at rest.   Leukocytosis 9.8 from11.0 down from 12.3. Procalcitonin elevated at 6.4  Seen by PT recommends  SNF    #3 status post fall   CT head no acute findings..   #4 hypokalemia resolved   #5 constipation Start stool softeners patient take stool softeners at home on a as needed basis.   Estimated body mass index is 21.44 kg/m as calculated from the following:   Height as of this encounter: 5\' 3"  (1.6 m).   Weight as of this encounter: 54.9 kg.  Discharge Instructions   Allergies as of 11/17/2021       Reactions   Penicillins Shortness Of Breath   Quinolones Other (See Comments)   Aneurysm   Sulfamethoxazole Hives        Medication List     STOP taking these medications    ibuprofen 200 MG tablet Commonly known as: ADVIL   metoprolol tartrate 100 MG tablet Commonly known as: LOPRESSOR   nitrofurantoin (macrocrystal-monohydrate) 100 MG capsule Commonly known as: MACROBID   PROBIOTIC DAILY PO       TAKE these medications    acetaminophen 500 MG tablet Commonly known as: TYLENOL Take 1,000 mg by mouth every 6 (six) hours as needed for mild pain or headache.   albuterol 108 (90 Base) MCG/ACT inhaler Commonly known as: VENTOLIN HFA Inhale into the lungs every 6 (six) hours as needed for wheezing or shortness of breath.   cefdinir 300 MG capsule Commonly known as: OMNICEF Take 1 capsule (300 mg total) by mouth 2 (two) times daily.   loratadine 10 MG tablet Commonly known as: CLARITIN Take 10 mg by mouth daily as needed for allergies.   meloxicam 15 MG tablet Commonly known as: MOBIC  Take 15 mg by mouth daily as needed for pain.   senna-docusate 8.6-50 MG tablet Commonly known as: Senokot-S Take 1 tablet by mouth at bedtime as needed for mild constipation.        Follow-up Information     Lula Olszewskieddy, Mahitha, MD Follow up.   Contact information: [address] [place] [address] [phone]         Little IshikawaSchumann, Christopher L, MD .   Specialties: Cardiology, Radiology Contact information: [address] [place]  [id number] [phone]                Allergies  Allergen Reactions   Penicillins Shortness Of Breath   Quinolones Other (See Comments)    Aneurysm   Sulfamethoxazole Hives    Consultations: none   Procedures/Studies: CT Head Wo Contrast  Result Date: 11/13/2021 CLINICAL DATA:  Trauma. EXAM: CT HEAD WITHOUT CONTRAST CT MAXILLOFACIAL WITHOUT CONTRAST CT CERVICAL SPINE WITHOUT CONTRAST TECHNIQUE: Multidetector CT imaging of the head, cervical spine, and maxillofacial structures were performed using the standard protocol without intravenous contrast. Multiplanar CT image reconstructions of the cervical spine and maxillofacial structures were also generated. RADIATION DOSE REDUCTION: This exam was performed according to the departmental dose-optimization program which includes automated exposure control, adjustment of the mA and/or kV according to patient size and/or use of iterative reconstruction technique. COMPARISON:  Head CT dated 07/22/2020. FINDINGS: CT HEAD FINDINGS Brain: Mild age-related atrophy and moderate chronic microvascular ischemic changes. No acute intracranial hemorrhage. No mass effect or midline shift. No extra-axial fluid collection. Vascular: No hyperdense vessel or unexpected calcification. Skull: Normal. Negative for fracture or focal lesion. Other: Contusion over the forehead. CT MAXILLOFACIAL FINDINGS Osseous: No acute fracture.  No mandibular dislocation. Orbits: The globes and retro-orbital fat are preserved. Sinuses: Diffuse mucoperiosteal thickening of paranasal sinuses with partial opacification of the right maxillary and sphenoid sinuses with air-fluid level. The mastoid air cells are clear. Soft tissues: Soft tissue contusion over the forehead. CT CERVICAL SPINE FINDINGS Alignment: No acute subluxation. Skull base and vertebrae: No acute cervical spine fracture. Age indeterminate mild compression fracture of superior endplate of T4. Correlation with clinical exam  and point tenderness recommended. Soft tissues and spinal canal: No prevertebral fluid or swelling. No visible canal hematoma. Disc levels:  No acute findings.  Degenerative changes. Upper chest: Negative. Other: Bilateral carotid bulb calcified plaques. IMPRESSION: 1. No acute intracranial pathology. 2. No acute facial bone fractures. 3. No acute fracture or subluxation of the cervical spine. 4. Age indeterminate mild compression fracture of superior endplate of T4. Correlation with clinical exam and point tenderness recommended. Electronically Signed   By: Elgie CollardArash  Radparvar M.D.   On: 11/13/2021 22:07   CT Cervical Spine Wo Contrast  Result Date: 11/13/2021 CLINICAL DATA:  Trauma. EXAM: CT HEAD WITHOUT CONTRAST CT MAXILLOFACIAL WITHOUT CONTRAST CT CERVICAL SPINE WITHOUT CONTRAST TECHNIQUE: Multidetector CT imaging of the head, cervical spine, and maxillofacial structures were performed using the standard protocol without intravenous contrast. Multiplanar CT image reconstructions of the cervical spine and maxillofacial structures were also generated. RADIATION DOSE REDUCTION: This exam was performed according to the departmental dose-optimization program which includes automated exposure control, adjustment of the mA and/or kV according to patient size and/or use of iterative reconstruction technique. COMPARISON:  Head CT dated 07/22/2020. FINDINGS: CT HEAD FINDINGS Brain: Mild age-related atrophy and moderate chronic microvascular ischemic changes. No acute intracranial hemorrhage. No mass effect or midline shift. No extra-axial fluid collection. Vascular: No hyperdense vessel or  unexpected calcification. Skull: Normal. Negative for fracture or focal lesion. Other: Contusion over the forehead. CT MAXILLOFACIAL FINDINGS Osseous: No acute fracture.  No mandibular dislocation. Orbits: The globes and retro-orbital fat are preserved. Sinuses: Diffuse mucoperiosteal thickening of paranasal sinuses with partial  opacification of the right maxillary and sphenoid sinuses with air-fluid level. The mastoid air cells are clear. Soft tissues: Soft tissue contusion over the forehead. CT CERVICAL SPINE FINDINGS Alignment: No acute subluxation. Skull base and vertebrae: No acute cervical spine fracture. Age indeterminate mild compression fracture of superior endplate of T4. Correlation with clinical exam and point tenderness recommended. Soft tissues and spinal canal: No prevertebral fluid or swelling. No visible canal hematoma. Disc levels:  No acute findings.  Degenerative changes. Upper chest: Negative. Other: Bilateral carotid bulb calcified plaques. IMPRESSION: 1. No acute intracranial pathology. 2. No acute facial bone fractures. 3. No acute fracture or subluxation of the cervical spine. 4. Age indeterminate mild compression fracture of superior endplate of T4. Correlation with clinical exam and point tenderness recommended. Electronically Signed   By: Elgie Collard M.D.   On: 11/13/2021 22:07   DG Chest Portable 1 View  Result Date: 11/13/2021 CLINICAL DATA:  Provided history: Fever. EXAM: PORTABLE CHEST 1 VIEW COMPARISON:  CT angiogram chest 11/12/2021 prior chest radiographs 10/10/2021 and earlier. FINDINGS: There is significant patient rotation to the right. Heart size at the upper limits of normal. Aortic atherosclerosis. Mild prominence of the interstitial lung markings. No appreciable pleural effusion or evidence of pneumothorax. No acute bony abnormality identified. IMPRESSION: Mild prominence of the interstitial lung markings, which may reflect interstitial edema or possibly atypical/viral pneumonia. Aortic Atherosclerosis (ICD10-I70.0). Electronically Signed   By: Jackey Loge D.O.   On: 11/13/2021 21:05   CT ANGIO CHEST AORTA W/CM & OR WO/CM  Result Date: 11/12/2021 CLINICAL DATA:  Follow-up aortic aneurysm. EXAM: CT ANGIOGRAPHY CHEST WITH CONTRAST TECHNIQUE: Multidetector CT imaging of the chest was  performed using the standard protocol during bolus administration of intravenous contrast. Multiplanar CT image reconstructions and MIPs were obtained to evaluate the vascular anatomy. RADIATION DOSE REDUCTION: This exam was performed according to the departmental dose-optimization program which includes automated exposure control, adjustment of the mA and/or kV according to patient size and/or use of iterative reconstruction technique. CONTRAST:  38mL ISOVUE-370 IOPAMIDOL (ISOVUE-370) INJECTION 76% COMPARISON:  CT 05/06/2021 FINDINGS: Cardiovascular: Ascending thoracic aorta measures 5.2 cm in axial dimension at the level of the pulmonary outflow track compared to 5.3 cm on comparison exam. Diameter measured at same level and orientation. Descending thoracic aorta normal caliber. Great vessels normal. Mediastinum/Nodes: No axillary or supraclavicular adenopathy. No mediastinal or hilar adenopathy. No pericardial fluid. Esophagus normal. Lungs/Pleura: Mild interstitial edema pattern. Thickening along the RIGHT oblique fissure. Trace effusions. Airspace disease Upper Abdomen: Limited view of the liver, kidneys, pancreas are unremarkable. Normal adrenal glands. Musculoskeletal: No aggressive osseous lesion. Review of the MIP images confirms the above findings. IMPRESSION: 1. 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 Recommend semi-annual imaging followup by CTA or MRA and referral to cardiothoracic surgery if not already obtained. This recommendation follows 2010 ACCF/AHA/AATS/ACR/ASA/SCA/SCAI/SIR/STS/SVM Guidelines for the Diagnosis and Management of Patients With Thoracic Aortic Disease. Circulation. 2010; 121: D428-J681. Aortic aneurysm NOS (ICD10-I71.9) 2.  Mild interstitial edema pattern in the lungs. Electronically Signed   By: Genevive Bi M.D.   On: 11/12/2021 14:02   CT Maxillofacial Wo Contrast  Result Date: 11/13/2021 CLINICAL DATA:  Trauma. EXAM: CT HEAD WITHOUT CONTRAST CT  MAXILLOFACIAL  WITHOUT CONTRAST CT CERVICAL SPINE WITHOUT CONTRAST TECHNIQUE: Multidetector CT imaging of the head, cervical spine, and maxillofacial structures were performed using the standard protocol without intravenous contrast. Multiplanar CT image reconstructions of the cervical spine and maxillofacial structures were also generated. RADIATION DOSE REDUCTION: This exam was performed according to the departmental dose-optimization program which includes automated exposure control, adjustment of the mA and/or kV according to patient size and/or use of iterative reconstruction technique. COMPARISON:  Head CT dated 07/22/2020. FINDINGS: CT HEAD FINDINGS Brain: Mild age-related atrophy and moderate chronic microvascular ischemic changes. No acute intracranial hemorrhage. No mass effect or midline shift. No extra-axial fluid collection. Vascular: No hyperdense vessel or unexpected calcification. Skull: Normal. Negative for fracture or focal lesion. Other: Contusion over the forehead. CT MAXILLOFACIAL FINDINGS Osseous: No acute fracture.  No mandibular dislocation. Orbits: The globes and retro-orbital fat are preserved. Sinuses: Diffuse mucoperiosteal thickening of paranasal sinuses with partial opacification of the right maxillary and sphenoid sinuses with air-fluid level. The mastoid air cells are clear. Soft tissues: Soft tissue contusion over the forehead. CT CERVICAL SPINE FINDINGS Alignment: No acute subluxation. Skull base and vertebrae: No acute cervical spine fracture. Age indeterminate mild compression fracture of superior endplate of T4. Correlation with clinical exam and point tenderness recommended. Soft tissues and spinal canal: No prevertebral fluid or swelling. No visible canal hematoma. Disc levels:  No acute findings.  Degenerative changes. Upper chest: Negative. Other: Bilateral carotid bulb calcified plaques. IMPRESSION: 1. No acute intracranial pathology. 2. No acute facial bone fractures. 3. No  acute fracture or subluxation of the cervical spine. 4. Age indeterminate mild compression fracture of superior endplate of T4. Correlation with clinical exam and point tenderness recommended. Electronically Signed   By: Elgie CollardArash  Radparvar M.D.   On: 11/13/2021 22:07   (Echo, Carotid, EGD, Colonoscopy, ERCP)    Subjective: Patient resting in bed in no acute distress sister by the bedside.  Reports her cough and breathing is better  Discharge Exam: Vitals:   11/17/21 0508 11/17/21 0924  BP: (!) 157/87 119/72  Pulse: 99 97  Resp: 16 19  Temp: 98.3 F (36.8 C) 97.8 F (36.6 C)  SpO2: 95% 96%   Vitals:   11/16/21 2008 11/16/21 2029 11/17/21 0508 11/17/21 0924  BP:  (!) 156/86 (!) 157/87 119/72  Pulse:  (!) 104 99 97  Resp:  17 16 19   Temp:  98.5 F (36.9 C) 98.3 F (36.8 C) 97.8 F (36.6 C)  TempSrc:      SpO2: 97% 96% 95% 96%  Weight:      Height:        General: Pt is alert, awake, not in acute distress Cardiovascular: RRR, S1/S2 +, no rubs, no gallops Respiratory: CTA bilaterally, no wheezing, no rhonchi Abdominal: Soft, NT, ND, bowel sounds + Extremities: no edema, no cyanosis    The results of significant diagnostics from this hospitalization (including imaging, microbiology, ancillary and laboratory) are listed below for reference.     Microbiology: Recent Results (from the past 240 hour(s))  Urine Culture     Status: Abnormal   Collection Time: 11/13/21  7:45 PM   Specimen: Urine, Catheterized  Result Value Ref Range Status   Specimen Description URINE, CATHETERIZED  Final   Special Requests   Final    NONE Performed at [facility], [address] [place], [place] [address]    Culture 2,000 COLONIES/mL PROTEUS MIRABILIS (A)  Final   Report Status 11/16/2021 FINAL  Final  Organism ID, Bacteria PROTEUS MIRABILIS (A)  Final      Susceptibility   Proteus mirabilis - MIC*    AMPICILLIN <=2 SENSITIVE Sensitive     CEFAZOLIN <=4 SENSITIVE Sensitive      CEFEPIME <=0.12 SENSITIVE Sensitive     CEFTRIAXONE <=0.25 SENSITIVE Sensitive     CIPROFLOXACIN <=0.25 SENSITIVE Sensitive     GENTAMICIN <=1 SENSITIVE Sensitive     IMIPENEM 2 SENSITIVE Sensitive     NITROFURANTOIN 128 RESISTANT Resistant     TRIMETH/SULFA <=20 SENSITIVE Sensitive     AMPICILLIN/SULBACTAM <=2 SENSITIVE Sensitive     PIP/TAZO <=4 SENSITIVE Sensitive     * 2,000 COLONIES/mL PROTEUS MIRABILIS  Resp Panel by RT-PCR (Flu A&B, Covid)     Status: None   Collection Time: 11/13/21  7:51 PM   Specimen: Nasopharyngeal(NP) swabs in vial transport medium  Result Value Ref Range Status   SARS Coronavirus 2 by RT PCR NEGATIVE NEGATIVE Final    Comment: (NOTE) SARS-CoV-2 target nucleic acids are NOT DETECTED.  The SARS-CoV-2 RNA is generally detectable in upper respiratory specimens during the acute phase of infection. The lowest concentration of SARS-CoV-2 viral copies this assay can detect is 138 copies/mL. A negative result does not preclude SARS-Cov-2 infection and should not be used as the sole basis for treatment or other patient management decisions. A negative result may occur with  improper specimen collection/handling, submission of specimen other than nasopharyngeal swab, presence of viral mutation(s) within the areas targeted by this assay, and inadequate number of viral copies(<138 copies/mL). A negative result must be combined with clinical observations, patient history, and epidemiological information. The expected result is Negative.  Fact Sheet for Patients:  BloggerCourse.com  Fact Sheet for Healthcare Providers:  SeriousBroker.it  This test is no t yet approved or cleared by the [place] FDA and  has been authorized for detection and/or diagnosis of SARS-CoV-2 by FDA under an Emergency Use Authorization (EUA). This EUA will remain  in effect (meaning this test can be used) for the duration of  the COVID-19 declaration under Section 564(b)(1) of the Act, 21 U.S.C.section 360bbb-3(b)(1), unless the authorization is terminated  or revoked sooner.       Influenza A by PCR NEGATIVE NEGATIVE Final   Influenza B by PCR NEGATIVE NEGATIVE Final    Comment: (NOTE) The Xpert Xpress SARS-CoV-2/FLU/RSV plus assay is intended as an aid in the diagnosis of influenza from Nasopharyngeal swab specimens and should not be used as a sole basis for treatment. Nasal washings and aspirates are unacceptable for Xpert Xpress SARS-CoV-2/FLU/RSV testing.  Fact Sheet for Patients: BloggerCourse.com  Fact Sheet for Healthcare Providers: SeriousBroker.it  This test is not yet approved or cleared by the [place] FDA and has been authorized for detection and/or diagnosis of SARS-CoV-2 by FDA under an Emergency Use Authorization (EUA). This EUA will remain in effect (meaning this test can be used) for the duration of the COVID-19 declaration under Section 564(b)(1) of the Act, 21 U.S.C. section 360bbb-3(b)(1), unless the authorization is terminated or revoked.  Performed at [facility], [address] [place], [place] [address]   Culture, blood (routine x 2)     Status: None (Preliminary result)   Collection Time: 11/13/21  8:02 PM   Specimen: BLOOD LEFT ARM  Result Value Ref Range Status   Specimen Description BLOOD LEFT ARM  Final   Special Requests   Final    BOTTLES DRAWN AEROBIC AND ANAEROBIC Blood Culture adequate  volume   Culture   Final    NO GROWTH 4 DAYS Performed at [facility], [address], [place], [place] [address]    Report Status PENDING  Incomplete  Culture, blood (routine x 2)     Status: None (Preliminary result)   Collection Time: 11/13/21  9:23 PM   Specimen: BLOOD LEFT ARM  Result Value Ref Range Status   Specimen Description BLOOD LEFT ARM  Final   Special Requests   Final    BOTTLES DRAWN  AEROBIC AND ANAEROBIC Blood Culture results may not be optimal due to an inadequate volume of blood received in culture bottles   Culture   Final    NO GROWTH 4 DAYS Performed at [facility], [address] [place], [place] [address]    Report Status PENDING  Incomplete     Labs: BNP (last 3 results) Recent Labs    03/16/21 1842 11/13/21 2002  BNP 79.5 332.1*   Basic Metabolic Panel: Recent Labs  Lab 11/13/21 2002 11/14/21 0426 11/14/21 1706 11/15/21 0736 11/16/21 1243 11/17/21 0617  NA 137 142  --  138 135 138  K 2.9* 3.3*  --  3.4* 4.1 4.2  CL 102 108  --  106 104 106  CO2 25 24  --  21* 20* 24  GLUCOSE 119* 83  --  90 94 105*  BUN 7* 5*  --  5* 6* 6*  CREATININE 0.71 0.59  --  0.55 0.54 0.55  CALCIUM 9.1 9.5  --  9.1 9.3 9.4  MG 1.9  --  1.9  --   --   --    Liver Function Tests: Recent Labs  Lab 11/13/21 2002  AST 17  ALT 10  ALKPHOS 95  BILITOT 0.6  PROT 6.6  ALBUMIN 2.9*   No results for input(s): LIPASE, AMYLASE in the last 168 hours. No results for input(s): AMMONIA in the last 168 hours. CBC: Recent Labs  Lab 11/13/21 2002 11/14/21 0426 11/15/21 0736 11/16/21 1243 11/17/21 0617  WBC 12.3* 11.0* 9.8 10.9* 11.7*  NEUTROABS 9.2*  --   --   --   --   HGB 11.5* 12.0 11.8* 12.2 11.6*  HCT 36.4 39.1 36.0 37.7 36.6  MCV 93.1 95.1 91.8 92.4 91.3  PLT 462* 490* 445* 502* 493*   Cardiac Enzymes: No results for input(s): CKTOTAL, CKMB, CKMBINDEX, TROPONINI in the last 168 hours. BNP: Invalid input(s): POCBNP CBG: No results for input(s): GLUCAP in the last 168 hours. D-Dimer No results for input(s): DDIMER in the last 72 hours. Hgb A1c No results for input(s): HGBA1C in the last 72 hours. Lipid Profile No results for input(s): CHOL, HDL, LDLCALC, TRIG, CHOLHDL, LDLDIRECT in the last 72 hours. Thyroid function studies No results for input(s): TSH, T4TOTAL, T3FREE, THYROIDAB in the last 72 hours.  Invalid input(s): FREET3 Anemia work  up No results for input(s): VITAMINB12, FOLATE, FERRITIN, TIBC, IRON, RETICCTPCT in the last 72 hours. Urinalysis    Component Value Date/Time   COLORURINE YELLOW 11/13/2021 1945   APPEARANCEUR CLEAR 11/13/2021 1945   LABSPEC 1.009 11/13/2021 1945   PHURINE 7.0 11/13/2021 1945   GLUCOSEU NEGATIVE 11/13/2021 1945   HGBUR NEGATIVE 11/13/2021 1945   BILIRUBINUR NEGATIVE 11/13/2021 1945   KETONESUR 5 (A) 11/13/2021 1945   PROTEINUR NEGATIVE 11/13/2021 1945   NITRITE NEGATIVE 11/13/2021 1945   LEUKOCYTESUR NEGATIVE 11/13/2021 1945   Sepsis Labs Invalid input(s): PROCALCITONIN,  WBC,  LACTICIDVEN Microbiology Recent  Results (from the past 240 hour(s))  Urine Culture     Status: Abnormal   Collection Time: 11/13/21  7:45 PM   Specimen: Urine, Catheterized  Result Value Ref Range Status   Specimen Description URINE, CATHETERIZED  Final   Special Requests   Final    NONE Performed at [facility], [address] [place], [place] [address]    Culture 2,000 COLONIES/mL PROTEUS MIRABILIS (A)  Final   Report Status 11/16/2021 FINAL  Final   Organism ID, Bacteria PROTEUS MIRABILIS (A)  Final      Susceptibility   Proteus mirabilis - MIC*    AMPICILLIN <=2 SENSITIVE Sensitive     CEFAZOLIN <=4 SENSITIVE Sensitive     CEFEPIME <=0.12 SENSITIVE Sensitive     CEFTRIAXONE <=0.25 SENSITIVE Sensitive     CIPROFLOXACIN <=0.25 SENSITIVE Sensitive     GENTAMICIN <=1 SENSITIVE Sensitive     IMIPENEM 2 SENSITIVE Sensitive     NITROFURANTOIN 128 RESISTANT Resistant     TRIMETH/SULFA <=20 SENSITIVE Sensitive     AMPICILLIN/SULBACTAM <=2 SENSITIVE Sensitive     PIP/TAZO <=4 SENSITIVE Sensitive     * 2,000 COLONIES/mL PROTEUS MIRABILIS  Resp Panel by RT-PCR (Flu A&B, Covid)     Status: None   Collection Time: 11/13/21  7:51 PM   Specimen: Nasopharyngeal(NP) swabs in vial transport medium  Result Value Ref Range Status   SARS Coronavirus 2 by RT PCR NEGATIVE NEGATIVE Final    Comment:  (NOTE) SARS-CoV-2 target nucleic acids are NOT DETECTED.  The SARS-CoV-2 RNA is generally detectable in upper respiratory specimens during the acute phase of infection. The lowest concentration of SARS-CoV-2 viral copies this assay can detect is 138 copies/mL. A negative result does not preclude SARS-Cov-2 infection and should not be used as the sole basis for treatment or other patient management decisions. A negative result may occur with  improper specimen collection/handling, submission of specimen other than nasopharyngeal swab, presence of viral mutation(s) within the areas targeted by this assay, and inadequate number of viral copies(<138 copies/mL). A negative result must be combined with clinical observations, patient history, and epidemiological information. The expected result is Negative.  Fact Sheet for Patients:  BloggerCourse.com  Fact Sheet for Healthcare Providers:  SeriousBroker.it  This test is no t yet approved or cleared by the [place] FDA and  has been authorized for detection and/or diagnosis of SARS-CoV-2 by FDA under an Emergency Use Authorization (EUA). This EUA will remain  in effect (meaning this test can be used) for the duration of the COVID-19 declaration under Section 564(b)(1) of the Act, 21 U.S.C.section 360bbb-3(b)(1), unless the authorization is terminated  or revoked sooner.       Influenza A by PCR NEGATIVE NEGATIVE Final   Influenza B by PCR NEGATIVE NEGATIVE Final    Comment: (NOTE) The Xpert Xpress SARS-CoV-2/FLU/RSV plus assay is intended as an aid in the diagnosis of influenza from Nasopharyngeal swab specimens and should not be used as a sole basis for treatment. Nasal washings and aspirates are unacceptable for Xpert Xpress SARS-CoV-2/FLU/RSV testing.  Fact Sheet for Patients: BloggerCourse.com  Fact Sheet for Healthcare  Providers: SeriousBroker.it  This test is not yet approved or cleared by the [place] FDA and has been authorized for detection and/or diagnosis of SARS-CoV-2 by FDA under an Emergency Use Authorization (EUA). This EUA will remain in effect (meaning this test can be used) for the duration of the COVID-19 declaration under Section 564(b)(1) of the Act,  21 U.S.C. section 360bbb-3(b)(1), unless the authorization is terminated or revoked.  Performed at [facility], [address], [place], [place] [address]   Culture, blood (routine x 2)     Status: None (Preliminary result)   Collection Time: 11/13/21  8:02 PM   Specimen: BLOOD LEFT ARM  Result Value Ref Range Status   Specimen Description BLOOD LEFT ARM  Final   Special Requests   Final    BOTTLES DRAWN AEROBIC AND ANAEROBIC Blood Culture adequate volume   Culture   Final    NO GROWTH 4 DAYS Performed at [facility], [address] [place], [place] [address]    Report Status PENDING  Incomplete  Culture, blood (routine x 2)     Status: None (Preliminary result)   Collection Time: 11/13/21  9:23 PM   Specimen: BLOOD LEFT ARM  Result Value Ref Range Status   Specimen Description BLOOD LEFT ARM  Final   Special Requests   Final    BOTTLES DRAWN AEROBIC AND ANAEROBIC Blood Culture results may not be optimal due to an inadequate volume of blood received in culture bottles   Culture   Final    NO GROWTH 4 DAYS Performed at [facility], [address], [place], [place] [address]    Report Status PENDING  Incomplete     Time coordinating discharge:38 minutes  SIGNED:   Alwyn Ren, MD  [facility] 11/17/2021, 2:35 PM

## 2021-11-17 NOTE — TOC Transition Note (Signed)
Transition of Care [facility]) - CM/SW Discharge Note   Patient Details  Name: LEEONA MCCARDLE MRN: [id number] Date of Birth: Dec 09, 1945  [facility]) CM/SW Contact:  Ralene Bathe, LCSWA Phone Number: 11/17/2021, 3:31 PM   Clinical Narrative:    Patient will DC to:  [facility]  SNF Anticipated DC date: 11/17/2021 Family notified: Yes Transport by:  Sharin Mons   Per MD patient ready for DC to SNF. RN to call report prior to discharge [phone] room 107. RN, patient, patient's family, and facility notified of DC. Discharge Summary and FL2 sent to facility. DC packet on chart. Ambulance transport requested for patient.   CSW will sign off for now as social work intervention is no longer needed. Please consult [place] again if new needs arise.     Final next level of care: Skilled Nursing Facility Barriers to Discharge: Barriers Resolved   Patient Goals and CMS Choice Patient states their goals for this hospitalization and ongoing recovery are:: return home with sister following rehab CMS Medicare.gov Compare Post Acute Care list provided to:: Patient Represenative (must comment) (Sister) Choice offered to / list presented to : Patient, Sibling  Discharge Placement              Patient chooses bed at: [facility] [place] Patient to be transferred to facility by: [facility] Name of family member notified: Lacey Jensen (Sister)   [phone] Patient and family notified of of transfer: 11/17/21  Discharge Plan and Services     Post Acute Care Choice: Skilled Nursing Facility                               Social Determinants of Health (SDOH) Interventions     Readmission Risk Interventions No flowsheet data found.

## 2021-11-17 NOTE — TOC Progression Note (Addendum)
Transition of Care [facility]) - Initial/Assessment Note    Patient Details  Name: SELINA TAPPER MRN: [id number] Date of Birth: 01-14-1946  [facility]) CM/SW Contact:    Milinda Antis, Elida Phone Number: 11/17/2021, 10:36 AM  Clinical Narrative:                 CSW met with the patient at patient's sibling at bedside to provide an update.  The patient does not have any bed offers at this time.   13:00-  [facility] has extended a bed offer.  CSW contacted [facility] to inquire as to when they can take the patient and is awaiting a response.    Expected Discharge Plan: Skilled Nursing Facility Barriers to Discharge: Continued Medical Work up, SNF Pending bed offer   Patient Goals and CMS Choice Patient states their goals for this hospitalization and ongoing recovery are:: return home with sister following rehab CMS Medicare.gov Compare Post Acute Care list provided to:: Patient Represenative (must comment) (Sister) Choice offered to / list presented to : Patient, Sibling  Expected Discharge Plan and Services Expected Discharge Plan: [place] Acute Care Choice: [place] arrangements for the past 2 months: Single Family Home                                      Prior Living Arrangements/Services Living arrangements for the past 2 months: Single Family Home Lives with:: Siblings Patient language and need for interpreter reviewed:: Yes Do you feel safe going back to the place where you live?: Yes      Need for Family Participation in Patient Care: Yes (Comment) Care giver support system in place?: Yes (comment) Current home services: DME Criminal Activity/Legal Involvement Pertinent to Current Situation/Hospitalization: No - Comment as needed  Activities of Daily Living Home Assistive Devices/Equipment: Shower chair with back ADL Screening (condition at time of admission) Patient's cognitive ability adequate to safely  complete daily activities?: No Is the patient deaf or have difficulty hearing?: No Does the patient have difficulty seeing, even when wearing glasses/contacts?: No Does the patient have difficulty concentrating, remembering, or making decisions?: Yes Patient able to express need for assistance with ADLs?: No Does the patient have difficulty dressing or bathing?: Yes Independently performs ADLs?: No Communication: Independent Dressing (OT): Dependent Is this a change from baseline?: Pre-admission baseline Grooming: Dependent Is this a change from baseline?: Pre-admission baseline Feeding: Needs assistance Is this a change from baseline?: Pre-admission baseline Bathing: Dependent Is this a change from baseline?: Pre-admission baseline Toileting: Dependent, Needs assistance Is this a change from baseline?: Pre-admission baseline In/Out Bed: Needs assistance Is this a change from baseline?: Pre-admission baseline Walks in Home: Needs assistance Is this a change from baseline?: Pre-admission baseline Does the patient have difficulty walking or climbing stairs?: Yes Weakness of Legs: Both Weakness of Arms/Hands: Both  Permission Sought/Granted Permission sought to share information with : Case Manager, Customer service manager, Family Supports Permission granted to share information with : Yes, Verbal Permission Granted  Share Information with NAME: Narda Rutherford  Permission granted to share info w AGENCY: SNF  Permission granted to share info w Relationship: sister  Permission granted to share info w Contact Information: [phone]  Emotional Assessment Appearance:: Appears stated age Attitude/Demeanor/Rapport: Unable to Assess Affect (typically observed): Accepting, Pleasant Orientation: : Oriented to Self, Oriented to Place  Psych Involvement: No (comment)  Admission diagnosis:  Hypokalemia [E87.6] Prolonged Q-T interval on ECG [R94.31] CAP (community acquired pneumonia)  [J18.9] Contusion of forehead, initial encounter [S00.83XA] Patient Active Problem List   Diagnosis Date Noted   CAP (community acquired pneumonia) 11/13/2021   Hypokalemia 11/13/2021   Prolonged QT interval 11/13/2021   Dementia (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 [place] [address] Phone: [phone] Fax: [phone]     Social Determinants of Health (SDOH) Interventions    Readmission Risk Interventions No flowsheet data found.

## 2021-11-17 NOTE — Progress Notes (Signed)
DISCHARGE NOTE [facility] to be discharged Skilled nursing facility per MD order. Patient verbalized understanding.  Skin clean, dry and intact without evidence of skin break down, no evidence of skin tears noted. IV catheter discontinued intact. Site without signs and symptoms of complications. Dressing and pressure applied. Pt denies pain at the site currently. No complaints noted.  Patient free of lines, drains, and wounds.   Discharge packet assembled. An After Visit Summary (AVS) was printed and given to the EMS personnel. Patient escorted via stretcher and discharged to [facility] via ambulance. Report called to accepting facility; all questions and concerns addressed.   Lorine Bears, RN

## 2021-11-18 LAB — CULTURE, BLOOD (ROUTINE X 2)
Culture: NO GROWTH
Culture: NO GROWTH
Special Requests: ADEQUATE

## 2021-12-04 ENCOUNTER — Ambulatory Visit: Payer: Medicare Other | Admitting: Physician Assistant

## 2021-12-10 ENCOUNTER — Ambulatory Visit: Payer: Medicare Other | Admitting: Podiatry

## 2022-01-13 ENCOUNTER — Ambulatory Visit
Admission: RE | Admit: 2022-01-13 | Discharge: 2022-01-13 | Disposition: A | Discharge status: Home / Self Care | Payer: Medicare Other | Source: Ambulatory Visit | Attending: Family | Admitting: Family

## 2022-01-13 DIAGNOSIS — E2839 Other primary ovarian failure: Secondary | ICD-10-CM

## 2022-02-18 ENCOUNTER — Encounter: Payer: Self-pay | Admitting: Physician Assistant

## 2022-02-26 ENCOUNTER — Ambulatory Visit (INDEPENDENT_AMBULATORY_CARE_PROVIDER_SITE_OTHER): Payer: Medicare Other | Admitting: Physician Assistant

## 2022-02-26 ENCOUNTER — Encounter: Payer: Self-pay | Admitting: Physician Assistant

## 2022-02-26 VITALS — BP 113/75 | HR 87 | Resp 18 | Ht 66.0 in | Wt 111.0 lb

## 2022-02-26 DIAGNOSIS — F01C0 Vascular dementia, severe, without behavioral disturbance, psychotic disturbance, mood disturbance, and anxiety: Secondary | ICD-10-CM | POA: Diagnosis not present

## 2022-02-26 DIAGNOSIS — F0280 Dementia in other diseases classified elsewhere without behavioral disturbance: Secondary | ICD-10-CM | POA: Diagnosis not present

## 2022-02-26 DIAGNOSIS — R413 Other amnesia: Secondary | ICD-10-CM | POA: Diagnosis not present

## 2022-02-26 DIAGNOSIS — G3101 Pick's disease: Secondary | ICD-10-CM

## 2022-02-26 MED ORDER — MEMANTINE HCL 10 MG PO TABS: ORAL_TABLET | ORAL | 11 refills | Status: DC

## 2022-02-26 NOTE — Patient Instructions (Addendum)
It was a pleasure to see you today at our office.   Recommendations:  Neurocognitive evaluation at our office MRI of the brain, the radiology office will call you to arrange you appointment Start memantine 10mg  take 1 at night x 2 weeks and then increase to 1 tab two times a day   Home Speech therapy  Follow up in 3 month   Whom to call:  Memory  decline, memory medications: Call our office [phone]   For psychiatric meds, mood meds: Please have your primary care physician manage these medications.   Counseling regarding caregiver distress, including caregiver depression, anxiety and issues regarding community resources, adult day care programs, adult living facilities, or memory care questions:   Feel free to contact Misty [phone], Social Worker at [phone]   For assessment of decision of mental capacity and competency:  Call Dr. [phone], geriatric psychiatrist at [phone]  For guidance in geriatric dementia issues please call [facility] [phone]  For guidance regarding [facility] and if placement were needed at the facility, contact [phone], Social Worker tel: [phone]  If you have any severe symptoms of a stroke, or other severe issues such as confusion,severe chills or fever, etc call 911 or go to the ER as you may need to be evaluated further   Feel free to visit Facebook page " [employer]" for tips of how to care for people with memory problems.   Feel free to go to the following database for funded clinical studies conducted around the world: 694-854-6270   https://www.triadclinicaltrials.com/     RECOMMENDATIONS FOR ALL PATIENTS WITH MEMORY PROBLEMS: 1. Continue to exercise (Recommend 30 minutes of walking everyday, or 3 hours every week) 2. Increase social interactions - continue going to [place] and enjoy social gatherings with friends and family 3. Eat healthy, avoid fried foods and  eat more fruits and vegetables 4. Maintain adequate blood pressure, blood sugar, and blood cholesterol level. Reducing the risk of stroke and cardiovascular disease also helps promoting better memory. 5. Avoid stressful situations. Live a simple life and avoid aggravations. Organize your time and prepare for the next day in anticipation. 6. Sleep well, avoid any interruptions of sleep and avoid any distractions in the bedroom that may interfere with adequate sleep quality 7. Avoid sugar, avoid sweets as there is a strong link between excessive sugar intake, diabetes, and cognitive impairment We discussed the Mediterranean diet, which has been shown to help patients reduce the risk of progressive memory disorders and reduces cardiovascular risk. This includes eating fish, eat fruits and green leafy vegetables, nuts like almonds and hazelnuts, walnuts, and also use olive oil. Avoid fast foods and fried foods as much as possible. Avoid sweets and sugar as sugar use has been linked to worsening of memory function.  There is always a concern of gradual progression of memory problems. If this is the case, then we may need to adjust level of care according to patient needs. Support, both to the patient and caregiver, should then be put into place.      You have been referred for a neuropsychological evaluation (i.e., evaluation of memory and thinking abilities). Please bring someone with you to this appointment if possible, as it is helpful for the doctor to hear from both you and another adult who knows you well. Please bring eyeglasses and hearing aids if you wear them.    The evaluation will take approximately 3 hours and has two parts:  The first part is a clinical interview with the neuropsychologist (Dr. Milbert Coulter or Dr. Roseanne Reno). During the interview, the neuropsychologist will speak with you and the individual you brought to the appointment.    The second part of the evaluation is testing with the  doctor's technician Annabelle Harman or Selena Batten). During the testing, the technician will ask you to remember different types of material, solve problems, and answer some questionnaires. Your family member will not be present for this portion of the evaluation.   Please note: We must reserve several hours of the neuropsychologist's time and the psychometrician's time for your evaluation appointment. As such, there is a No-Show fee of $100. If you are unable to attend any of your appointments, please contact our office as soon as possible to reschedule.    FALL PRECAUTIONS: Be cautious when walking. Scan the area for obstacles that may increase the risk of trips and falls. When getting up in the mornings, sit up at the edge of the bed for a few minutes before getting out of bed. Consider elevating the bed at the head end to avoid drop of blood pressure when getting up. Walk always in a well-lit room (use night lights in the walls). Avoid area rugs or power cords from appliances in the middle of the walkways. Use a walker or a cane if necessary and consider physical therapy for balance exercise. Get your eyesight checked regularly.  FINANCIAL OVERSIGHT: Supervision, especially oversight when making financial decisions or transactions is also recommended.  HOME SAFETY: Consider the safety of the kitchen when operating appliances like stoves, microwave oven, and blender. Consider having supervision and share cooking responsibilities until no longer able to participate in those. Accidents with firearms and other hazards in the house should be identified and addressed as well.   ABILITY TO BE LEFT ALONE: If patient is unable to contact 911 operator, consider using LifeLine, or when the need is there, arrange for someone to stay with patients. Smoking is a fire hazard, consider supervision or cessation. Risk of wandering should be assessed by caregiver and if detected at any point, supervision and safe proof recommendations  should be instituted.  MEDICATION SUPERVISION: Inability to self-administer medication needs to be constantly addressed. Implement a mechanism to ensure safe administration of the medications.   DRIVING: Regarding driving, in patients with progressive memory problems, driving will be impaired. We advise to have someone else do the driving if trouble finding directions or if minor accidents are reported. Independent driving assessment is available to determine safety of driving.   If you are interested in the driving assessment, you can contact the following:  [employer] in [place] [phone]  [facility] [phone]  [facility] [phone] or [phone]    Mediterranean Diet A Mediterranean diet refers to food and lifestyle choices that are based on the traditions of countries located on the [employer]. This way of eating has been shown to help prevent certain conditions and improve outcomes for people who have chronic diseases, like kidney disease and heart disease. What are tips for following this plan? Lifestyle  Cook and eat meals together with your family, when possible. Drink enough fluid to keep your urine clear or pale yellow. Be physically active every day. This includes: Aerobic exercise like running or swimming. Leisure activities like gardening, walking, or housework. Get 7-8 hours of sleep each night. If recommended by your health care provider, drink red wine in moderation. This means  1 glass a day for nonpregnant women and 2 glasses a day for men. A glass of wine equals 5 oz (150 mL). Reading food labels  Check the serving size of packaged foods. For foods such as rice and pasta, the serving size refers to the amount of cooked product, not dry. Check the total fat in packaged foods. Avoid foods that have saturated fat or trans fats. Check the ingredients list for added sugars,  such as corn syrup. Shopping  At the grocery store, buy most of your food from the areas near the walls of the store. This includes: Fresh fruits and vegetables (produce). Grains, beans, nuts, and seeds. Some of these may be available in unpackaged forms or large amounts (in bulk). Fresh seafood. Poultry and eggs. Low-fat dairy products. Buy whole ingredients instead of prepackaged foods. Buy fresh fruits and vegetables in-season from local farmers markets. Buy frozen fruits and vegetables in resealable bags. If you do not have access to quality fresh seafood, buy precooked frozen shrimp or canned fish, such as tuna, salmon, or sardines. Buy small amounts of raw or cooked vegetables, salads, or olives from the deli or salad bar at your store. Stock your pantry so you always have certain foods on hand, such as olive oil, canned tuna, canned tomatoes, rice, pasta, and beans. Cooking  Cook foods with extra-virgin olive oil instead of using butter or other vegetable oils. Have meat as a side dish, and have vegetables or grains as your main dish. This means having meat in small portions or adding small amounts of meat to foods like pasta or stew. Use beans or vegetables instead of meat in common dishes like chili or lasagna. Experiment with different cooking methods. Try roasting or broiling vegetables instead of steaming or sauteing them. Add frozen vegetables to soups, stews, pasta, or rice. Add nuts or seeds for added healthy fat at each meal. You can add these to yogurt, salads, or vegetable dishes. Marinate fish or vegetables using olive oil, lemon juice, garlic, and fresh herbs. Meal planning  Plan to eat 1 vegetarian meal one day each week. Try to work up to 2 vegetarian meals, if possible. Eat seafood 2 or more times a week. Have healthy snacks readily available, such as: Vegetable sticks with hummus. Greek yogurt. Fruit and nut trail mix. Eat balanced meals throughout the week. This  includes: Fruit: 2-3 servings a day Vegetables: 4-5 servings a day Low-fat dairy: 2 servings a day Fish, poultry, or lean meat: 1 serving a day Beans and legumes: 2 or more servings a week Nuts and seeds: 1-2 servings a day Whole grains: 6-8 servings a day Extra-virgin olive oil: 3-4 servings a day Limit red meat and sweets to only a few servings a month What are my food choices? Mediterranean diet Recommended Grains: Whole-grain pasta. Brown rice. Bulgar wheat. Polenta. Couscous. Whole-wheat bread. Orpah Cobb. Vegetables: Artichokes. Beets. Broccoli. Cabbage. Carrots. Eggplant. Green beans. Chard. Kale. Spinach. Onions. Leeks. Peas. Squash. Tomatoes. Peppers. Radishes. Fruits: Apples. Apricots. Avocado. Berries. Bananas. Cherries. Dates. Figs. Grapes. Lemons. Melon. Oranges. Peaches. Plums. Pomegranate. Meats and other protein foods: Beans. Almonds. Sunflower seeds. Pine nuts. Peanuts. Cod. Salmon. Scallops. Shrimp. Tuna. Tilapia. Clams. Oysters. Eggs. Dairy: Low-fat milk. Cheese. Greek yogurt. Beverages: Water. Red wine. Herbal tea. Fats and oils: Extra virgin olive oil. Avocado oil. Grape seed oil. Sweets and desserts: [place] yogurt with honey. Baked apples. Poached pears. Trail mix. Seasoning and other foods: Basil. Cilantro. Coriander. Cumin. Mint. Parsley. Sage. Rosemary. Tarragon.  Garlic. Oregano. Thyme. Pepper. Balsalmic vinegar. Tahini. Hummus. Tomato sauce. Olives. Mushrooms. Limit these Grains: Prepackaged pasta or rice dishes. Prepackaged cereal with added sugar. Vegetables: Deep fried potatoes (french fries). Fruits: Fruit canned in syrup. Meats and other protein foods: Beef. Pork. Lamb. Poultry with skin. Hot dogs. Tomasa Blase. Dairy: Ice cream. Sour cream. Whole milk. Beverages: Juice. Sugar-sweetened soft drinks. Beer. Liquor and spirits. Fats and oils: Butter. Canola oil. Vegetable oil. Beef fat (tallow). Lard. Sweets and desserts: Cookies. Cakes. Pies. Candy. Seasoning  and other foods: Mayonnaise. Premade sauces and marinades. The items listed may not be a complete list. Talk with your dietitian about what dietary choices are right for you. Summary The Mediterranean diet includes both food and lifestyle choices. Eat a variety of fresh fruits and vegetables, beans, nuts, seeds, and whole grains. Limit the amount of red meat and sweets that you eat. Talk with your health care provider about whether it is safe for you to drink red wine in moderation. This means 1 glass a day for nonpregnant women and 2 glasses a day for men. A glass of wine equals 5 oz (150 mL). This information is not intended to replace advice given to you by your health care provider. Make sure you discuss any questions you have with your health care provider. Document Released: 04/30/2016 Document Revised: 06/02/2016 Document Reviewed: 04/30/2016 Elsevier Interactive Patient Education  2017 [employer].   We have sent a referral to [facility] for your MRI and they will call you directly to schedule your appointment. They are located at [address] [facility]. If you need to contact them directly please call [phone].

## 2022-02-26 NOTE — Progress Notes (Cosign Needed)
Assessment/Plan:    The patient is seen in neurologic consultation at the request of Colleen Hsu, MD for the evaluation of memory.  Colleen Parker is a very pleasant 76 y.o. 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 In 2021 she had been seen at [facility] after head trauma without need to follow up for headaches without formal cognitive evaluation at the time. CT then showed chronic microvascular ischemic disease and mild generalized atrophy. Since that time speech and memory have worsened. MoCA today is 3/30, with deficiencies in most areas at 0,, but attention is 2/2, and naming 1/3.  However, she reports that she may know the work, but she is unable to express test, raising concerns for PPA.  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  Side effects discussed Continue taking aspirin daily Strong control of cardiovascular risk factors Folllow up once results above are available   Subjective:    The patient is accompanied by her sister and her caregiver Colleen Parker who supplement the history.    How long did patient have memory difficulties? 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  Then she began having difficulties with short-term memory, and for the last year, she has demonstrated  difficulties with speech, "knowing what I want to say, but it does not come out of my mouth ".  She continues to enjoy reading, watching TV and music. Patient lives with: sister and has an aide coming for 8 hrs repeats oneself? Denies  Disoriented when walking into a room?  Patient denies except "always getting confused when trying to find a bathroom " leaving objects in unusual places?  Patient denies   Ambulates  with difficulty?   Patient denies, but uses a walker for stability.   "Walks a lot" Recent falls?  Patient denies   Any head injuries?  She has a history of concussion of the forehead on 11/13/2021, and on 07/22/2020 after a fall after UTI and Requiring hospitalization history of seizures?   Patient denies   Wandering behavior?  Patient denies   Patient drives?   Patient no longer drives. Stopped after the head injury  Any mood changes such irritability agitation?  Patient denies  "She is sweet" Any history of depression?:  Patient denies   Hallucinations?  Patient denies   Paranoia?  Patient denies   Patient reports that he sleeps well without vivid dreams, REM behavior or sleepwalking    History of sleep apnea?  Patient denies   Any hygiene concerns?  Patient denies   Independent of bathing and dressing?  Endorsed  Does the patient needs help with medications?  Caregiver in charge Who is in charge of the finances? Sister [name] is in charge  Any changes in appetite?  Patient denies   Patient have trouble swallowing? Patient denies   Does the patient cook?  Patient cooks occasionally  Any kitchen accidents such as leaving the stove on? Patient denies   Any headaches?  Patient denies   The double vision? Patient denies   Any focal numbness or tingling?  Patient denies   Chronic back pain Patient denies   Unilateral weakness?  Patient denies   Any tremors?  Patient denies   Any history of anosmia?  Patient denies   Any incontinence of urine?  "Only at night may need a diaper ".   She had a recent UTI and requiring hospitalization in February 2023 Any bowel dysfunction?   Patient denies   History of heavy alcohol intake?  Patient denies   History of heavy tobacco use?  Patient denies   Family history of dementia?  Strong family history of Alzheimer's disease , Mo, maternal aunt, maternal first cousin Engineer, maintenance (IT) (valedictorian), retired Airline pilot  Allergies  Allergen Reactions   Penicillins Shortness Of Breath   Quinolones Other (See Comments)    Aneurysm   Sulfamethoxazole Hives    Current Outpatient Medications  Medication Instructions   acetaminophen (TYLENOL) 1,000 mg, Oral, Every 6 hours PRN   albuterol (VENTOLIN HFA) 108 (90 Base) MCG/ACT inhaler Inhalation, Every 6 hours PRN   alendronate (FOSAMAX) 70 mg, Oral, Weekly   aspirin EC 81 mg, Oral, Daily, Swallow whole.   cefdinir (OMNICEF) 300 mg, Oral, 2 times daily   cholecalciferol (VITAMIN D3) 1,000 Units, Oral, Daily   loratadine (CLARITIN) 10 mg, Daily PRN   meloxicam (MOBIC) 15 mg, Daily PRN   memantine (NAMENDA) 10 MG tablet Take 1 tablet (10 mg at night) for 2 weeks, then increase to 1 tablet (10 mg) twice a day   senna-docusate (SENOKOT-S) 8.6-50 MG tablet 1 tablet, Oral, At bedtime PRN     VITALS:   Vitals:   02/26/22 1341  BP: 113/75  Pulse: 87  Resp: 18  SpO2: 97%  Weight: 111 lb (50.3 kg)  Height: 5\' 6"  (1.676 m)       No data to display          PHYSICAL EXAM   HEENT:  Normocephalic, atraumatic. The mucous membranes are moist. The superficial temporal arteries are without ropiness or tenderness. Cardiovascular: Regular rate and rhythm. Lungs: Clear to auscultation bilaterally. Neck: There are no carotid bruits noted bilaterally.  NEUROLOGICAL:    02/27/2022    9:00 AM  Montreal Cognitive Assessment   Visuospatial/ Executive (0/5) 0  Naming (0/3) 1  Attention: Read list of digits (0/2) 2  Attention: Read list of letters (0/1) 0  Attention: Serial 7 subtraction  starting at 100 (0/3) 0  Language: Repeat phrase (0/2) 0  Language : Fluency (0/1) 0  Abstraction (0/2) 0  Delayed Recall (0/5) 0  Orientation (0/6) 0  Total 3  Adjusted Score (based on education) 3        No data to display           Orientation:  Alert and oriented to person, not to place or time.  Mils aphasia, no dysarthria. Fund of knowledge is appropriate. Recent memory impaired and remote memory intact.  Attention and concentration are normal. UNable to name objects and repeat phrases. Delayed recall 0/5  Cranial nerves: There is good facial symmetry. Extraocular muscles are intact and visual fields are full to confrontational testing. Speech is not fluent but clear.  Soft palate rises symmetrically and there is no tongue deviation. Hearing is intact to conversational tone. Tone: Tone is good throughout. Sensation: Sensation  is intact to light touch and pinprick throughout. Vibration is intact at the bilateral big toe.There is no extinction with double simultaneous stimulation. There is no sensory dermatomal level identified. Coordination: The patient has no difficulty with RAM's or FNF bilaterally. Normal finger to nose  Motor: Strength is 5/5 in the bilateral upper and lower extremities. There is no pronator drift. There are no fasciculations noted. DTR's: Deep tendon reflexes are 2/4 at the bilateral biceps, triceps, brachioradialis, patella and achilles.  Plantar responses are downgoing bilaterally. Gait and Station: The patient is unable to ambulate without a walker. Scoliosis and kyphosis noted . Unable to test Romberg     Thank you for allowing us the opportunity to participate in the care of this nice patient. Please do not hesitate to contact us for any questions or concerns.   Total time spent on today's visit was 60 minutes dedicated to this patient today, preparing to see patient, examining the patient, ordering tests and/or medications and counseling the patient,  documenting clinical information in the EHR or other health record, independently interpreting results and communicating results to the patient/family, discussing treatment and goals, answering patient's questions and coordinating care.  Cc:  Lula Olszewskieddy, Mahitha, MD  Marlowe KaysSara Luca Dyar 02/27/2022 9:45 AM

## 2022-02-27 DIAGNOSIS — F0280 Dementia in other diseases classified elsewhere without behavioral disturbance: Secondary | ICD-10-CM | POA: Insufficient documentation

## 2022-03-04 ENCOUNTER — Other Ambulatory Visit (HOSPITAL_COMMUNITY): Payer: Self-pay | Admitting: Internal Medicine

## 2022-03-04 DIAGNOSIS — I739 Peripheral vascular disease, unspecified: Secondary | ICD-10-CM

## 2022-03-11 ENCOUNTER — Ambulatory Visit (HOSPITAL_COMMUNITY)
Admission: RE | Admit: 2022-03-11 | Discharge: 2022-03-11 | Disposition: A | Discharge status: Home / Self Care | Payer: Medicare Other | Source: Ambulatory Visit | Attending: Cardiovascular Disease | Admitting: Cardiovascular Disease

## 2022-03-11 DIAGNOSIS — I739 Peripheral vascular disease, unspecified: Secondary | ICD-10-CM

## 2022-03-12 ENCOUNTER — Telehealth: Payer: Self-pay | Admitting: Physician Assistant

## 2022-03-12 NOTE — Telephone Encounter (Signed)
Will forward 

## 2022-03-12 NOTE — Telephone Encounter (Signed)
Patient's sister Wille Celeste called and said [facility] told her they cannot do the MRI on the patient because she needs help getting on the table.  She'd like to know what to do now?

## 2022-03-13 NOTE — Telephone Encounter (Signed)
I called patient and she is scheduled for July 3 at [facility]. Will follow up afterwards, thanked me for calling.

## 2022-03-17 ENCOUNTER — Ambulatory Visit (INDEPENDENT_AMBULATORY_CARE_PROVIDER_SITE_OTHER): Payer: Medicare Other | Admitting: Cardiology

## 2022-03-17 VITALS — BP 118/80 | HR 116 | Ht 66.0 in | Wt 117.6 lb

## 2022-03-17 DIAGNOSIS — E785 Hyperlipidemia, unspecified: Secondary | ICD-10-CM

## 2022-03-17 DIAGNOSIS — I2510 Atherosclerotic heart disease of native coronary artery without angina pectoris: Secondary | ICD-10-CM

## 2022-03-17 DIAGNOSIS — I712 Thoracic aortic aneurysm, without rupture, unspecified: Secondary | ICD-10-CM | POA: Diagnosis not present

## 2022-03-18 LAB — LIPID PANEL
Chol/HDL Ratio: 4.3 ratio (ref 0.0–4.4)
Cholesterol, Total: 189 mg/dL (ref 100–199)
HDL: 44 mg/dL (ref >39)
LDL Chol Calc (NIH): 123 mg/dL — ABNORMAL HIGH (ref 0–99)
Triglycerides: 122 mg/dL (ref 0–149)
VLDL Cholesterol Cal: 22 mg/dL (ref 5–40)

## 2022-03-25 ENCOUNTER — Telehealth: Payer: Self-pay | Admitting: Cardiology

## 2022-03-25 NOTE — Telephone Encounter (Signed)
*  STAT* If patient is at the pharmacy, call can be transferred to refill team.   1. Which medications need to be refilled? (please list name of each medication and dose if known) Rosuvastatin 10 mg  2. Which pharmacy/location (including street and city if local pharmacy) is medication to be sent to?  [facility] - [place], [place] - [address]  3. Do they need a 30 day or 90 day supply? 30 day supply  Prescription was never sent to pharmacy as advised by Dr. Bjorn Pippin based on lab results.

## 2022-03-26 ENCOUNTER — Other Ambulatory Visit: Payer: Self-pay

## 2022-03-26 ENCOUNTER — Other Ambulatory Visit: Payer: Self-pay | Admitting: Surgery

## 2022-03-26 DIAGNOSIS — I7121 Aneurysm of the ascending aorta, without rupture: Secondary | ICD-10-CM

## 2022-03-26 MED ORDER — ROSUVASTATIN CALCIUM 10 MG PO TABS: 10.0000 mg | ORAL_TABLET | Freq: Every day | ORAL | 3 refills | Status: DC

## 2022-03-26 NOTE — Telephone Encounter (Signed)
I reviewed lab results- Dr.Schumann order to send Rosuvastatin 10 mg, I resent the RX to pharmacy.

## 2022-04-01 ENCOUNTER — Other Ambulatory Visit: Payer: Self-pay | Admitting: *Deleted

## 2022-04-01 DIAGNOSIS — E785 Hyperlipidemia, unspecified: Secondary | ICD-10-CM

## 2022-04-21 ENCOUNTER — Encounter: Payer: Self-pay | Admitting: Podiatry

## 2022-04-21 ENCOUNTER — Ambulatory Visit (INDEPENDENT_AMBULATORY_CARE_PROVIDER_SITE_OTHER): Payer: Medicare Other | Admitting: Podiatry

## 2022-04-21 DIAGNOSIS — M79675 Pain in left toe(s): Secondary | ICD-10-CM

## 2022-04-21 DIAGNOSIS — F01C0 Vascular dementia, severe, without behavioral disturbance, psychotic disturbance, mood disturbance, and anxiety: Secondary | ICD-10-CM | POA: Diagnosis not present

## 2022-04-21 DIAGNOSIS — B351 Tinea unguium: Secondary | ICD-10-CM | POA: Diagnosis not present

## 2022-04-21 DIAGNOSIS — M79674 Pain in right toe(s): Secondary | ICD-10-CM

## 2022-04-21 NOTE — Progress Notes (Signed)
This patient returns to the office for evaluation and treatment of long thick painful nails .  This patient is unable to trim her own nails since the patient cannot reach her feet.  Patient says the nails are painful walking and wearing his shoes.  She returns for preventive foot care services.  She presents with female caregiver.  General Appearance  Alert, conversant and in no acute stress.  Vascular  Dorsalis pedis and posterior tibial  pulses are  weakly palpable  bilaterally.  Capillary return is within normal limits  bilaterally. Cold feet  bilaterally.  Neurologic  Senn-Weinstein monofilament wire test within normal limits  bilaterally. Muscle power within normal limits bilaterally.  Nails Thick disfigured discolored nails with subungual debris  from hallux to fifth toes bilaterally. No evidence of bacterial infection or drainage bilaterally.  Orthopedic  No limitations of motion  feet .  No crepitus or effusions noted.  HAV  B/L.  Hammet toes  B/L.  Skin  normotropic skin with no porokeratosis noted bilaterally.  No signs of infections or ulcers noted.     Onychomycosis  Pain in toes right foot  Pain in toes left foot  Debridement  of nails  1-5  B/L with a nail nipper.  Nails were then filed using a dremel tool with no incidents.   Both hallux nail plates are loosely attached. RTC 3 months    Colleen Parker DPM   

## 2022-05-05 ENCOUNTER — Encounter: Payer: Self-pay | Admitting: Psychology

## 2022-05-15 ENCOUNTER — Other Ambulatory Visit: Payer: Medicare Other

## 2022-05-20 ENCOUNTER — Inpatient Hospital Stay: Admission: RE | Admit: 2022-05-20 | Payer: Medicare Other | Source: Ambulatory Visit

## 2022-05-20 ENCOUNTER — Encounter: Payer: Self-pay | Admitting: Surgery

## 2022-05-20 ENCOUNTER — Ambulatory Visit (INDEPENDENT_AMBULATORY_CARE_PROVIDER_SITE_OTHER): Payer: Medicare Other | Admitting: Surgery

## 2022-05-20 ENCOUNTER — Ambulatory Visit
Admission: RE | Admit: 2022-05-20 | Discharge: 2022-05-20 | Disposition: A | Discharge status: Home / Self Care | Payer: Medicare Other | Source: Ambulatory Visit | Attending: Surgery | Admitting: Surgery

## 2022-05-20 VITALS — BP 127/79 | HR 109 | Resp 18 | Ht 66.0 in | Wt 121.0 lb

## 2022-05-20 DIAGNOSIS — I7121 Aneurysm of the ascending aorta, without rupture: Secondary | ICD-10-CM | POA: Diagnosis not present

## 2022-05-20 DIAGNOSIS — I2510 Atherosclerotic heart disease of native coronary artery without angina pectoris: Secondary | ICD-10-CM

## 2022-05-20 MED ORDER — IOPAMIDOL (ISOVUE-370) INJECTION 76%
75.0000 mL | Freq: Once | INTRAVENOUS | Status: AC | PRN
Start: 2022-05-20 — End: 2022-05-20
  Administered 2022-05-20: 75 mL via INTRAVENOUS

## 2022-05-20 NOTE — Progress Notes (Signed)
    HPI: ***  Current Outpatient Medications  Medication Sig Dispense Refill   acetaminophen (TYLENOL) 500 MG tablet Take 1,000 mg by mouth every 6 (six) hours as needed for mild pain or headache.     albuterol (VENTOLIN HFA) 108 (90 Base) MCG/ACT inhaler Inhale into the lungs every 6 (six) hours as needed for wheezing or shortness of breath.     alendronate (FOSAMAX) 70 MG tablet Take 70 mg by mouth once a week.     aspirin EC 81 MG tablet Take 81 mg by mouth daily. Swallow whole.     cholecalciferol (VITAMIN D3) 25 MCG (1000 UNIT) tablet Take 1,000 Units by mouth daily.     loratadine (CLARITIN) 10 MG tablet Take 10 mg by mouth daily as needed for allergies.     memantine (NAMENDA) 10 MG tablet Take 1 tablet (10 mg at night) for 2 weeks, then increase to 1 tablet (10 mg) twice a day 60 tablet 11   rosuvastatin (CRESTOR) 10 MG tablet Take 1 tablet (10 mg total) by mouth daily. 90 tablet 3   No current facility-administered medications for this visit.     Physical Exam: ***  Diagnostic Tests: ***  Impression: ***  Plan: ***   Alleen Borne, MD [facility] [phone]

## 2022-05-22 ENCOUNTER — Other Ambulatory Visit: Payer: Self-pay

## 2022-05-22 ENCOUNTER — Ambulatory Visit (HOSPITAL_COMMUNITY)
Admission: RE | Admit: 2022-05-22 | Discharge: 2022-05-22 | Disposition: A | Discharge status: Home / Self Care | Payer: Medicare Other | Source: Ambulatory Visit | Attending: Vascular Surgery | Admitting: Vascular Surgery

## 2022-05-22 DIAGNOSIS — I6529 Occlusion and stenosis of unspecified carotid artery: Secondary | ICD-10-CM

## 2022-05-25 NOTE — Progress Notes (Signed)
[facility] [place]  ASSESSMENT / PLAN: Colleen Parker is a 76 y.o. female with asymptomatic bilateral (left 40 to 59%, right 1 to 39%)  The patient should continue best medical therapy for carotid artery stenosis including: Complete cessation from all tobacco products. Blood glucose control with goal A1c < 7%. Blood pressure control with goal blood pressure < 140/90 mmHg. Lipid reduction therapy with goal LDL-C <100 mg/dL (<62 if symptomatic from carotid artery stenosis).  Aspirin 81mg  PO QD.  Atorvastatin 40-80mg  PO QD (or other "high intensity" statin therapy).  No need for revascularization of left carotid lesion, She is not a good surgical candidate.  Follow-up with me in 1 year with repeat carotid duplex.  CHIEF COMPLAINT: Incidental discovery of carotid artery stenosis  HISTORY OF PRESENT ILLNESS: Colleen Parker is a 76 y.o. female referred to clinic for evaluation of carotid artery stenosis demonstrated on CT angiogram of the chest done for ascending aortic aneurysm.  She has been under the care of Dr. 66 for the same.  Patient is asymptomatic from a carotid artery standpoint.  She denies any unilateral focal symptoms including amaurosis, facial droop, unilateral weakness, dysarthria.  Past Medical History:  Diagnosis Date   Chronic headaches    Menopausal state    Migraines    Recurrent sinus infections     Past Surgical History:  Procedure Laterality Date   DILATION AND CURETTAGE OF UTERUS     abnl cells   TONSILLECTOMY  1956    Family History  Problem Relation Age of Onset   Alzheimer's disease Mother    Heart attack Mother    Heart attack Father    Heart disease Brother     Social History   Socioeconomic History   Marital status: Widowed    Spouse name: Not on file   Number of children: 0   Years of education: BA   Highest education level: Bachelor's degree (e.g., BA, AB, BS)  Occupational History   Occupation: Retired      Comment: [name] retired  Tobacco Use   Smoking status: Never   Smokeless tobacco: Never  Substance and Sexual Activity   Alcohol use: Yes    Comment: ocass   Drug use: Never   Sexual activity: Not on file  Other Topics Concern   Not on file  Social History Narrative   Lives at home and younger sister lives with her    Caffeine use: coffee  And tea sometimes   Right handed   Social Determinants of Health   Financial Resource Strain: Not on file  Food Insecurity: Not on file  Transportation Needs: Not on file  Physical Activity: Not on file  Stress: Not on file  Social Connections: Not on file  Intimate Partner Violence: Not on file    Allergies  Allergen Reactions   Penicillins Shortness Of Breath   Quinolones Other (See Comments)    Aneurysm   Sulfamethoxazole Hives    Current Outpatient Medications  Medication Sig Dispense Refill   acetaminophen (TYLENOL) 500 MG tablet Take 1,000 mg by mouth every 6 (six) hours as needed for mild pain or headache.     albuterol (VENTOLIN HFA) 108 (90 Base) MCG/ACT inhaler Inhale into the lungs every 6 (six) hours as needed for wheezing or shortness of breath.     alendronate (FOSAMAX) 70 MG tablet Take 70 mg by mouth once a week.     aspirin EC 81 MG tablet Take 81 mg by  mouth daily. Swallow whole.     cholecalciferol (VITAMIN D3) 25 MCG (1000 UNIT) tablet Take 1,000 Units by mouth daily.     loratadine (CLARITIN) 10 MG tablet Take 10 mg by mouth daily as needed for allergies.     memantine (NAMENDA) 10 MG tablet Take 1 tablet (10 mg at night) for 2 weeks, then increase to 1 tablet (10 mg) twice a day 60 tablet 11   rosuvastatin (CRESTOR) 10 MG tablet Take 1 tablet (10 mg total) by mouth daily. 90 tablet 3   No current facility-administered medications for this visit.    PHYSICAL EXAM Vitals:   05/26/22 1439  BP: 125/78  Pulse: (!) 104  Temp: 98.3 F (36.8 C)  TempSrc: Oral  SpO2: 100%  Weight: 121 lb (54.9 kg)  Height: 5'  6" (1.676 m)    Frail elderly woman in no acute distress Regular rate and rhythm Unlabored breathing No focal neurologic signs   PERTINENT LABORATORY AND RADIOLOGIC DATA  Most recent CBC    Latest Ref Rng & Units 11/17/2021    6:17 AM 11/16/2021   12:43 PM 11/15/2021    7:36 AM  CBC  WBC 4.0 - 10.5 K/uL 11.7  10.9  9.8   Hemoglobin 12.0 - 15.0 g/dL 16.1  09.6  04.5   Hematocrit 36.0 - 46.0 % 36.6  37.7  36.0   Platelets 150 - 400 K/uL 493  502  445      Most recent CMP    Latest Ref Rng & Units 11/17/2021    6:17 AM 11/16/2021   12:43 PM 11/15/2021    7:36 AM  CMP  Glucose 70 - 99 mg/dL 409  94  90   BUN 8 - 23 mg/dL 6  6  5    Creatinine 0.44 - 1.00 mg/dL  8.11  9.14   Sodium 135 - 145 mmol/L 138  135  138   Potassium 3.5 - 5.1 mmol/L 4.2  4.1  3.4   Chloride 98 - 111 mmol/L 106  104  106   CO2 22 - 32 mmol/L 24  20  21    Calcium 8.9 - 10.3 mg/dL 9.4  9.3  9.1     Renal function CrCl cannot be calculated (Patient's most recent lab result is older than the maximum 21 days allowed.).  No results found for: "HGBA1C"  LDL Chol Calc (NIH)  Date Value Ref Range Status  03/17/2022 123 (H) 0 - 99 mg/dL Final    Carotid duplex Right Carotid: Velocities in the right ICA are consistent with a 1-39%  stenosis.   Left Carotid: Velocities in the left ICA are consistent with a 40-59%  stenosis.   Vertebrals:  Bilateral vertebral arteries demonstrate antegrade flow.  Subclavians: Normal flow hemodynamics were seen in bilateral subclavian               arteries.   . 03/19/2022, MD [facility] Phone Number: [phone] 05/26/2022 4:21 PM  Total time spent on preparing this encounter including chart review, data review, collecting history, examining the patient, coordinating care for this new patient, 60 minutes.  Portions of this report may have been transcribed using voice recognition software.  Every effort has been made  to ensure accuracy; however, inadvertent computerized transcription errors may still be present.

## 2022-05-26 ENCOUNTER — Encounter: Payer: Self-pay | Admitting: Vascular Surgery

## 2022-05-26 ENCOUNTER — Ambulatory Visit (INDEPENDENT_AMBULATORY_CARE_PROVIDER_SITE_OTHER): Payer: Medicare Other | Admitting: Vascular Surgery

## 2022-05-26 VITALS — BP 125/78 | HR 104 | Temp 98.3°F | Ht 66.0 in | Wt 121.0 lb

## 2022-05-26 DIAGNOSIS — I6523 Occlusion and stenosis of bilateral carotid arteries: Secondary | ICD-10-CM

## 2022-05-29 ENCOUNTER — Ambulatory Visit: Payer: Medicare Other | Admitting: Physician Assistant

## 2022-06-09 ENCOUNTER — Ambulatory Visit: Payer: Medicare Other | Admitting: Physician Assistant

## 2022-06-12 ENCOUNTER — Inpatient Hospital Stay (HOSPITAL_COMMUNITY)
Admission: EM | Admit: 2022-06-12 | Discharge: 2022-06-14 | DRG: 872 | Disposition: A | Discharge status: Home Health | Payer: Medicare Other | Attending: Internal Medicine | Admitting: Internal Medicine

## 2022-06-12 ENCOUNTER — Emergency Department (HOSPITAL_COMMUNITY): Payer: Medicare Other

## 2022-06-12 ENCOUNTER — Other Ambulatory Visit: Payer: Self-pay

## 2022-06-12 ENCOUNTER — Encounter (HOSPITAL_COMMUNITY): Payer: Self-pay

## 2022-06-12 DIAGNOSIS — Z881 Allergy status to other antibiotic agents status: Secondary | ICD-10-CM | POA: Diagnosis not present

## 2022-06-12 DIAGNOSIS — Z87440 Personal history of urinary (tract) infections: Secondary | ICD-10-CM | POA: Diagnosis not present

## 2022-06-12 DIAGNOSIS — Z820 Family history of epilepsy and other diseases of the nervous system: Secondary | ICD-10-CM

## 2022-06-12 DIAGNOSIS — I2510 Atherosclerotic heart disease of native coronary artery without angina pectoris: Secondary | ICD-10-CM | POA: Diagnosis present

## 2022-06-12 DIAGNOSIS — Z882 Allergy status to sulfonamides status: Secondary | ICD-10-CM | POA: Diagnosis not present

## 2022-06-12 DIAGNOSIS — Z7982 Long term (current) use of aspirin: Secondary | ICD-10-CM

## 2022-06-12 DIAGNOSIS — F0390 Unspecified dementia without behavioral disturbance: Secondary | ICD-10-CM | POA: Diagnosis present

## 2022-06-12 DIAGNOSIS — Z8249 Family history of ischemic heart disease and other diseases of the circulatory system: Secondary | ICD-10-CM | POA: Diagnosis not present

## 2022-06-12 DIAGNOSIS — A419 Sepsis, unspecified organism: Principal | ICD-10-CM | POA: Diagnosis present

## 2022-06-12 DIAGNOSIS — Z7983 Long term (current) use of bisphosphonates: Secondary | ICD-10-CM | POA: Diagnosis not present

## 2022-06-12 DIAGNOSIS — R6520 Severe sepsis without septic shock: Secondary | ICD-10-CM | POA: Diagnosis present

## 2022-06-12 DIAGNOSIS — F0280 Dementia in other diseases classified elsewhere without behavioral disturbance: Secondary | ICD-10-CM | POA: Diagnosis present

## 2022-06-12 DIAGNOSIS — N390 Urinary tract infection, site not specified: Secondary | ICD-10-CM | POA: Diagnosis present

## 2022-06-12 DIAGNOSIS — Z66 Do not resuscitate: Secondary | ICD-10-CM | POA: Diagnosis present

## 2022-06-12 DIAGNOSIS — Z880 Allergy status to penicillin: Secondary | ICD-10-CM | POA: Diagnosis not present

## 2022-06-12 DIAGNOSIS — Z20822 Contact with and (suspected) exposure to covid-19: Secondary | ICD-10-CM | POA: Diagnosis present

## 2022-06-12 DIAGNOSIS — F0150 Vascular dementia without behavioral disturbance: Secondary | ICD-10-CM | POA: Diagnosis present

## 2022-06-12 DIAGNOSIS — G3101 Pick's disease: Secondary | ICD-10-CM | POA: Diagnosis present

## 2022-06-12 DIAGNOSIS — Z79899 Other long term (current) drug therapy: Secondary | ICD-10-CM | POA: Diagnosis not present

## 2022-06-12 DIAGNOSIS — I714 Abdominal aortic aneurysm, without rupture, unspecified: Secondary | ICD-10-CM | POA: Diagnosis present

## 2022-06-12 DIAGNOSIS — I6529 Occlusion and stenosis of unspecified carotid artery: Secondary | ICD-10-CM | POA: Diagnosis present

## 2022-06-12 HISTORY — DX: Vascular dementia, unspecified severity, without behavioral disturbance, psychotic disturbance, mood disturbance, and anxiety: F01.50

## 2022-06-12 LAB — COMPREHENSIVE METABOLIC PANEL
ALT: 18 U/L (ref 0–44)
AST: 25 U/L (ref 15–41)
Albumin: 3.7 g/dL (ref 3.5–5.0)
Alkaline Phosphatase: 71 U/L (ref 38–126)
Anion gap: 10 (ref 5–15)
BUN: 18 mg/dL (ref 8–23)
CO2: 24 mmol/L (ref 22–32)
Calcium: 10 mg/dL (ref 8.9–10.3)
Chloride: 106 mmol/L (ref 98–111)
Creatinine, Ser: 0.8 mg/dL (ref 0.44–1.00)
GFR, Estimated: >60 mL/min (ref >60)
Glucose, Bld: 97 mg/dL (ref 70–99)
Potassium: 4 mmol/L (ref 3.5–5.1)
Sodium: 140 mmol/L (ref 135–145)
Total Bilirubin: 0.5 mg/dL (ref 0.3–1.2)
Total Protein: 7.5 g/dL (ref 6.5–8.1)

## 2022-06-12 LAB — URINALYSIS, ROUTINE W REFLEX MICROSCOPIC
Bacteria, UA: NONE SEEN
Bilirubin Urine: NEGATIVE
Glucose, UA: NEGATIVE mg/dL
Hgb urine dipstick: NEGATIVE
Ketones, ur: NEGATIVE mg/dL
Leukocytes,Ua: NEGATIVE
Nitrite: NEGATIVE
Protein, ur: NEGATIVE mg/dL
Specific Gravity, Urine: 1.01 (ref 1.005–1.030)
pH: 5 (ref 5.0–8.0)

## 2022-06-12 LAB — CBC WITH DIFFERENTIAL/PLATELET
Abs Immature Granulocytes: 0.09 10*3/uL — ABNORMAL HIGH (ref 0.00–0.07)
Basophils Absolute: 0 10*3/uL (ref 0.0–0.1)
Basophils Relative: 0 %
Eosinophils Absolute: 0.5 10*3/uL (ref 0.0–0.5)
Eosinophils Relative: 3 %
HCT: 38.6 % (ref 36.0–46.0)
Hemoglobin: 12.4 g/dL (ref 12.0–15.0)
Immature Granulocytes: 1 %
Lymphocytes Relative: 4 %
Lymphs Abs: 0.8 10*3/uL (ref 0.7–4.0)
MCH: 29.7 pg (ref 26.0–34.0)
MCHC: 32.1 g/dL (ref 30.0–36.0)
MCV: 92.3 fL (ref 80.0–100.0)
Monocytes Absolute: 0.6 10*3/uL (ref 0.1–1.0)
Monocytes Relative: 3 %
Neutro Abs: 15.1 10*3/uL — ABNORMAL HIGH (ref 1.7–7.7)
Neutrophils Relative %: 89 %
Platelets: 321 10*3/uL (ref 150–400)
RBC: 4.18 MIL/uL (ref 3.87–5.11)
RDW: 16.1 % — ABNORMAL HIGH (ref 11.5–15.5)
WBC: 17 10*3/uL — ABNORMAL HIGH (ref 4.0–10.5)
nRBC: 0 % (ref 0.0–0.2)

## 2022-06-12 LAB — LACTIC ACID, PLASMA
Lactic Acid, Venous: 2.2 mmol/L (ref 0.5–1.9)
Lactic Acid, Venous: 1.5 mmol/L (ref 0.5–1.9)

## 2022-06-12 LAB — PROTIME-INR
INR: 1 (ref 0.8–1.2)
Prothrombin Time: 13.4 seconds (ref 11.4–15.2)

## 2022-06-12 LAB — SARS CORONAVIRUS 2 BY RT PCR: SARS Coronavirus 2 by RT PCR: NEGATIVE

## 2022-06-12 LAB — APTT: aPTT: 32 seconds (ref 24–36)

## 2022-06-12 MED ORDER — SODIUM CHLORIDE 0.9 % IV SOLN
2.0000 g | Freq: Once | INTRAVENOUS | Status: AC
  Administered 2022-06-12: 2 g via INTRAVENOUS
  Filled 2022-06-12: qty 12.5

## 2022-06-12 MED ORDER — ONDANSETRON HCL 4 MG/2ML IJ SOLN: 4.0000 mg | Freq: Four times a day (QID) | INTRAMUSCULAR | Status: DC | PRN

## 2022-06-12 MED ORDER — ACETAMINOPHEN 325 MG PO TABS
650.0000 mg | ORAL_TABLET | Freq: Four times a day (QID) | ORAL | Status: DC | PRN
  Administered 2022-06-12: 650 mg via ORAL
  Filled 2022-06-12: qty 2

## 2022-06-12 MED ORDER — ROSUVASTATIN CALCIUM 5 MG PO TABS
10.0000 mg | ORAL_TABLET | Freq: Every day | ORAL | Status: DC
  Administered 2022-06-12 – 2022-06-14 (×3): 10 mg via ORAL
  Filled 2022-06-12 (×3): qty 2

## 2022-06-12 MED ORDER — HALOPERIDOL LACTATE 5 MG/ML IJ SOLN: 2.0000 mg | Freq: Four times a day (QID) | INTRAMUSCULAR | Status: DC | PRN

## 2022-06-12 MED ORDER — LACTATED RINGERS IV SOLN: INTRAVENOUS | Status: DC

## 2022-06-12 MED ORDER — ENOXAPARIN SODIUM 40 MG/0.4ML IJ SOSY
40.0000 mg | PREFILLED_SYRINGE | INTRAMUSCULAR | Status: DC
  Administered 2022-06-12 – 2022-06-13 (×2): 40 mg via SUBCUTANEOUS
  Filled 2022-06-12 (×2): qty 0.4

## 2022-06-12 MED ORDER — ORAL CARE MOUTH RINSE: 15.0000 mL | OROMUCOSAL | Status: DC | PRN

## 2022-06-12 MED ORDER — ASPIRIN 81 MG PO TBEC
81.0000 mg | DELAYED_RELEASE_TABLET | Freq: Every day | ORAL | Status: DC
  Administered 2022-06-12 – 2022-06-14 (×3): 81 mg via ORAL
  Filled 2022-06-12 (×3): qty 1

## 2022-06-12 MED ORDER — POLYETHYLENE GLYCOL 3350 17 G PO PACK: 17.0000 g | PACK | Freq: Every day | ORAL | Status: DC | PRN

## 2022-06-12 MED ORDER — SODIUM CHLORIDE 0.9 % IV SOLN
2.0000 g | Freq: Two times a day (BID) | INTRAVENOUS | Status: DC
  Administered 2022-06-12 – 2022-06-14 (×4): 2 g via INTRAVENOUS
  Filled 2022-06-12 (×4): qty 12.5

## 2022-06-12 MED ORDER — LACTATED RINGERS IV BOLUS (SEPSIS)
1000.0000 mL | Freq: Once | INTRAVENOUS | Status: AC
  Administered 2022-06-12: 1000 mL via INTRAVENOUS

## 2022-06-12 MED ORDER — MEMANTINE HCL 10 MG PO TABS
10.0000 mg | ORAL_TABLET | Freq: Two times a day (BID) | ORAL | Status: DC
  Administered 2022-06-12 – 2022-06-14 (×5): 10 mg via ORAL
  Filled 2022-06-12 (×5): qty 1

## 2022-06-12 MED ORDER — LORATADINE 10 MG PO TABS: 10.0000 mg | ORAL_TABLET | Freq: Every day | ORAL | Status: DC | PRN

## 2022-06-12 MED ORDER — VANCOMYCIN HCL 1250 MG/250ML IV SOLN
1250.0000 mg | Freq: Once | INTRAVENOUS | Status: AC
  Administered 2022-06-12: 1250 mg via INTRAVENOUS
  Filled 2022-06-12: qty 250

## 2022-06-12 MED ORDER — ACETAMINOPHEN 325 MG PO TABS
650.0000 mg | ORAL_TABLET | Freq: Once | ORAL | Status: AC
  Administered 2022-06-12: 650 mg via ORAL
  Filled 2022-06-12: qty 2

## 2022-06-12 MED ORDER — SODIUM CHLORIDE 0.9% FLUSH
3.0000 mL | Freq: Two times a day (BID) | INTRAVENOUS | Status: DC
  Administered 2022-06-12 – 2022-06-14 (×5): 3 mL via INTRAVENOUS

## 2022-06-12 MED ORDER — ACETAMINOPHEN 650 MG RE SUPP: 650.0000 mg | Freq: Four times a day (QID) | RECTAL | Status: DC | PRN

## 2022-06-12 MED ORDER — VANCOMYCIN HCL IN DEXTROSE 1-5 GM/200ML-% IV SOLN
1000.0000 mg | Freq: Once | INTRAVENOUS | Status: DC
  Filled 2022-06-12: qty 200

## 2022-06-12 MED ORDER — VANCOMYCIN HCL 1250 MG/250ML IV SOLN
1250.0000 mg | INTRAVENOUS | Status: DC
  Administered 2022-06-13 – 2022-06-14 (×2): 1250 mg via INTRAVENOUS
  Filled 2022-06-12 (×2): qty 250

## 2022-06-12 MED ORDER — METRONIDAZOLE 500 MG/100ML IV SOLN
500.0000 mg | Freq: Once | INTRAVENOUS | Status: AC
  Administered 2022-06-12: 500 mg via INTRAVENOUS
  Filled 2022-06-12: qty 100

## 2022-06-12 MED ORDER — DOCUSATE SODIUM 100 MG PO CAPS
100.0000 mg | ORAL_CAPSULE | Freq: Two times a day (BID) | ORAL | Status: DC
  Administered 2022-06-12 – 2022-06-14 (×5): 100 mg via ORAL
  Filled 2022-06-12 (×5): qty 1

## 2022-06-12 MED ORDER — BISACODYL 5 MG PO TBEC: 5.0000 mg | DELAYED_RELEASE_TABLET | Freq: Every day | ORAL | Status: DC | PRN

## 2022-06-12 MED ORDER — SODIUM CHLORIDE 0.9 % IV SOLN
2.0000 g | Freq: Once | INTRAVENOUS | Status: DC
  Administered 2022-06-12: 2 g via INTRAVENOUS
  Filled 2022-06-12: qty 20

## 2022-06-12 MED ORDER — ALBUTEROL SULFATE (2.5 MG/3ML) 0.083% IN NEBU: 3.0000 mL | INHALATION_SOLUTION | Freq: Three times a day (TID) | RESPIRATORY_TRACT | Status: DC | PRN

## 2022-06-12 MED ORDER — ONDANSETRON HCL 4 MG PO TABS: 4.0000 mg | ORAL_TABLET | Freq: Four times a day (QID) | ORAL | Status: DC | PRN

## 2022-06-12 NOTE — Progress Notes (Signed)
Pharmacy Antibiotic Note  Arlo Buffone [place] is a 76 y.o. female admitted on 06/12/2022 with concern for UTI.  Patient recently completed a course of Keflex and received 2 days of nitrofurantoin for a UTI.  Pharmacy has been consulted for vancomycin and cefepime dosing for sepsis.  Renal function stable, Tmax 101.2, WBC 17, LA 2.2, UA negative.  Plan: Vanc 1250mg  IV Q24H for AUC 527 using SCr 0.8 Cefepime 2gm IV Q12H Monitor renal fxn, clinical progress, vanc AUC as indicated  Height: 5\' 6"  (167.6 cm) Weight: 63 kg (138 lb 14.2 oz) IBW/kg (Calculated) : 59.3  Temp (24hrs), Avg:100.1 F (37.8 C), Min:98.9 F (37.2 C), Max:101.2 F (38.4 C)  Recent Labs  Lab 06/12/22 0537  WBC 17.0*  CREATININE 0.80  LATICACIDVEN 2.2*    Estimated Creatinine Clearance: 57.8 mL/min (by C-G formula based on SCr of 0.8 mg/dL).    Allergies  Allergen Reactions   Penicillins Shortness Of Breath   Quinolones Other (See Comments)    Aneurysm   Sulfamethoxazole Hives   Macrobid x2 doses PTA Vanc 9/22 >> Cefepime 9/22 >>  9/22 UCx -  9/22 BCx -   Nidia Grogan D. Mina Marble, PharmD, BCPS, [place] 06/12/2022, 6:43 AM

## 2022-06-12 NOTE — ED Triage Notes (Signed)
Pt arrived by EMS from home concerned for UTI, pt was given antibiotics but still had positive urine culture after 1st round of antibiotics.   Pt has a hx of dementia, pt is oriented to self only   Daughter is supposed to be coming to help to give history.

## 2022-06-12 NOTE — ED Notes (Signed)
Critical Lactic acid of 2.2 reported to Dr. Roxanne Mins

## 2022-06-12 NOTE — Sepsis Progress Note (Signed)
Monitoring for the code sepsis protocol. °

## 2022-06-12 NOTE — Plan of Care (Signed)
  Problem: Clinical Measurements: Goal: Diagnostic test results will improve Outcome: Progressing Goal: Signs and symptoms of infection will decrease Outcome: Progressing   Problem: Health Behavior/Discharge Planning: Goal: Ability to manage health-related needs will improve Outcome: Progressing   Problem: Clinical Measurements: Goal: Diagnostic test results will improve Outcome: Progressing   Problem: Nutrition: Goal: Adequate nutrition will be maintained Outcome: Progressing

## 2022-06-12 NOTE — ED Notes (Signed)
Pt informed the a urine sample was needed. If unable to void an in and out catheterization will be required pt states understanding.

## 2022-06-12 NOTE — ED Notes (Addendum)
Daughter arrived and states that pt was complaining of feeling shortness of breath last night. Since arrival pt has not had complaints of breath or shortness of breath.  Last dose of tylenol was last night around 8pm

## 2022-06-12 NOTE — H&P (Addendum)
History and Physical    Patient: Colleen Parker [id number] DOB: Mar 16, 1946 DOA: 06/12/2022 DOS: the patient was seen and examined on 06/12/2022 PCP: Margretta Sidle, MD  Patient coming from: Home - lives with sister; NOK: Sister, [phone]   Chief Complaint: fever  HPI: Colleen Parker is a 76 y.o. female with medical history significant of advanced dementia; carotid stenosis; and AAA presenting with fever.   She got SOB overnight.  She did fine last night but got up about 0400 and was SOB.  They went to the bathroom and still felt SOB.  She has a lot of drug allergies and her sister was worried that was the problem.  She got a bladder infection 2 weeks ago.  She was given Macrobid -> Vantin (cefpodoxime) x 7 days.  Tuesday she wasn't acting right and they checked urine again - it was clear but culture was positive.  They restarted Macrobid (they considered Cipro but then decided against it).  She had mild rhinorrhea this AM and yesterday.  No sick contacts.  She had a fever with the start of her UTI.    ER Course:  "Sort of sepsis".  Here with fever, mildly elevated lactate.  Treated with Keflex as outpatient for UTI.  Changed to [place] yesterday.  Urine is clean.  WBC 17.  No obvious source.  Started on broad spectrum abx and IVF.     Review of Systems: As mentioned in the history of present illness. All other systems reviewed and are negative.  Limited by dementia.  Past Medical History:  Diagnosis Date   Chronic headaches    Menopausal state    Migraines    Recurrent sinus infections    Vascular dementia without behavioral disturbance, psychotic disturbance, mood disturbance, or anxiety (HCC)    Past Surgical History:  Procedure Laterality Date   DILATION AND CURETTAGE OF UTERUS     abnl cells   TONSILLECTOMY  1956   Social History:  reports that she has never smoked. She has never used smokeless tobacco. She reports that she does not currently use alcohol. She reports that she  does not use drugs.  Allergies  Allergen Reactions   Penicillins Shortness Of Breath   Quinolones Other (See Comments)    Aneurysm   Sulfamethoxazole Hives and Swelling    Family History  Problem Relation Age of Onset   Alzheimer's disease Mother    Heart attack Mother    Heart attack Father    Heart disease Brother     Prior to Admission medications   Medication Sig Start Date End Date Taking? Authorizing Provider  acetaminophen (TYLENOL) 500 MG tablet Take 1,000 mg by mouth every 6 (six) hours as needed for mild pain or headache.    [provider]  albuterol (VENTOLIN HFA) 108 (90 Base) MCG/ACT inhaler Inhale into the lungs every 6 (six) hours as needed for wheezing or shortness of breath.    [provider]  alendronate (FOSAMAX) 70 MG tablet Take 70 mg by mouth once a week. 01/27/22   [provider]  aspirin EC 81 MG tablet Take 81 mg by mouth daily. Swallow whole.    [provider]  cholecalciferol (VITAMIN D3) 25 MCG (1000 UNIT) tablet Take 1,000 Units by mouth daily.    [provider]  loratadine (CLARITIN) 10 MG tablet Take 10 mg by mouth daily as needed for allergies.    [provider]  memantine (NAMENDA) 10 MG tablet Take 1 tablet (  10 mg at night) for 2 weeks, then increase to 1 tablet (10 mg) twice a day 02/26/22   Rondel Jumbo, PA-C  rosuvastatin (CRESTOR) 10 MG tablet Take 1 tablet (10 mg total) by mouth daily. 03/26/22 03/21/23  Donato Heinz, MD    Physical Exam: Vitals:   06/12/22 0730 06/12/22 0745 06/12/22 0800 06/12/22 0830  BP: (!) 106/57 (!) 104/47 (!) 106/50 (!) 113/53  Pulse: (!) 101 (!) 101 96 99  Resp: (!) 26 (!) 23 (!) 26 (!) 25  Temp:      TempSrc:      SpO2: 94% 93% 94% 96%  Weight:      Height:       General:  Appears calm and comfortable and is in NAD Eyes:   EOMI, normal lids, iris ENT:  grossly normal hearing, lips & tongue, mmm Neck:  no LAD, masses or  thyromegaly Cardiovascular:  RRR, no m/r/g. No LE edema.  Respiratory:   CTA bilaterally with no wheezes/rales/rhonchi.  Normal to mildly increased respiratory effort. Abdomen:  soft, NT, ND Skin:  no rash or induration seen on limited exam Musculoskeletal:  grossly normal tone BUE/BLE, good ROM, no bony abnormality Psychiatric:  grossly normal mood and affect, speech dysarthric/aphasic (at baseline, per sister) Neurologic:  CN 2-12 grossly intact, moves all extremities in coordinated fashion   Radiological Exams on Admission: Independently reviewed - see discussion in A/P where applicable  DG Chest Port 1 View  Result Date: 06/12/2022 CLINICAL DATA:  76 year old female with possible sepsis. EXAM: PORTABLE CHEST 1 VIEW COMPARISON:  Chest x-ray 11/13/2021. FINDINGS: Lung volumes are slightly low. No consolidative airspace disease. No pleural effusions. No pneumothorax. No evidence of pulmonary edema. Heart size is upper limits of normal. The patient is rotated to the right on today's exam, resulting in distortion of the mediastinal contours and reduced diagnostic sensitivity and specificity for mediastinal pathology. Atherosclerotic calcifications in the thoracic aorta. IMPRESSION: 1. No radiographic evidence of acute cardiopulmonary disease. 2. Aortic atherosclerosis. Electronically Signed   By: Vinnie Langton M.D.   On: 06/12/2022 06:01    EKG: Independently reviewed.  NSR with rate 95; IVCD; nonspecific ST changes with no evidence of acute ischemia   Labs on Admission: I have personally reviewed the available labs and imaging studies at the time of the admission.  Pertinent labs:    Normal CMP Lactate 2.2 WBC 17 UA WNL Blood and urine cultures pending COVID negative   Assessment and Plan: Principal Problem:   Sepsis due to undetermined organism ([place])    Sepsis -SIRS criteria in this patient includes: Leukocytosis, fever, tachycardia, tachypnea  -Patient has evidence of  acute organ failure with elevated lactate >2 that is not easily explained by another condition. -While awaiting blood cultures, this appears to be a preseptic condition. -Sepsis protocol initiated -Suspected source is uncertain but possibly partially treated UTI despite normal UA since she has recently been treated with antibiotics -Blood and urine cultures pending -Will admit due to: failure of outpatient treatment -Treat with IV Cefepime/Vanc for undifferentiated sepsis -Will trend lactate to ensure improvement -Will order procalcitonin level.  Antibiotics would not be indicated for PCT <0.1 and probably should not be used for < 0.25.  >0.5 indicates infection and >>0.5 indicates more serious disease.  As the procalcitonin level normalizes, it will be reasonable to consider de-escalation of antibiotic coverage.  Dementia -She has severe primary progressive aphasia and so her dementia may be less severe than it appears -Continue  Namenda -Will order delirium precautions -Will order prn Haldol for severe agitation  -Will request PT/OT/TOC team consults  ASCVD -Carotid stenosis, AAA - followed by vascular surgery and CVTS -Continue ASA and rosuvastatin  DNR -I have discussed code status with the patient and her sister and they are in agreement that the patient would not desire resuscitation and would prefer to die a natural death should that situation arise. -She will need a gold out of facility DNR form at the time of discharge     Advance Care Planning:   Code Status: DNR   Consults: PT/OT/TOC team  DVT Prophylaxis: Lovenox  Family Communication: Sister was present throughout evaluation  Severity of Illness: The appropriate patient status for this patient is INPATIENT. Inpatient status is judged to be reasonable and necessary in order to provide the required intensity of service to ensure the patient's safety. The patient's presenting symptoms, physical exam findings, and initial  radiographic and laboratory data in the context of their chronic comorbidities is felt to place them at high risk for further clinical deterioration. Furthermore, it is not anticipated that the patient will be medically stable for discharge from the hospital within 2 midnights of admission.   * I certify that at the point of admission it is my clinical judgment that the patient will require inpatient hospital care spanning beyond 2 midnights from the point of admission due to high intensity of service, high risk for further deterioration and high frequency of surveillance required.*  Author: Karmen Bongo, MD 06/12/2022 8:45 AM  For on call review [email or web address].

## 2022-06-12 NOTE — ED Provider Notes (Signed)
[facility] EMERGENCY DEPARTMENT Provider Note   CSN: [id number] Arrival date & time: 06/12/22  0448     History  Chief Complaint  Patient presents with   Abnormal Lab    Colleen Parker is a 76 y.o. female.  The history is provided by a relative. The history is limited by the condition of the patient (Dementia).  Abnormal Lab She is currently on nitrofurantoin for urinary tract infection.  She had been treated for urinary tract infection with cephalexin and completed the course 1 week ago.  Urine culture was resent 3 days ago and report was obtained and daughter was given a prescription for nitrofurantoin.  She has received 2 doses of nitrofurantoin.  During the night, she started complaining of some shortness of breath which precipitated the trip to the emergency department.  There has been no cough and no vomiting or diarrhea.   Home Medications Prior to Admission medications   Medication Sig Start Date End Date Taking? Authorizing Provider  acetaminophen (TYLENOL) 500 MG tablet Take 1,000 mg by mouth every 6 (six) hours as needed for mild pain or headache.    [provider]  albuterol (VENTOLIN HFA) 108 (90 Base) MCG/ACT inhaler Inhale into the lungs every 6 (six) hours as needed for wheezing or shortness of breath.    [provider]  alendronate (FOSAMAX) 70 MG tablet Take 70 mg by mouth once a week. 01/27/22   [provider]  aspirin EC 81 MG tablet Take 81 mg by mouth daily. Swallow whole.    [provider]  cholecalciferol (VITAMIN D3) 25 MCG (1000 UNIT) tablet Take 1,000 Units by mouth daily.    [provider]  loratadine (CLARITIN) 10 MG tablet Take 10 mg by mouth daily as needed for allergies.    [provider]  memantine (NAMENDA) 10 MG tablet Take 1 tablet (10 mg at night) for 2 weeks, then increase to 1 tablet (10 mg) twice a day 02/26/22   Rondel Jumbo, PA-C  rosuvastatin (CRESTOR) 10 MG tablet  Take 1 tablet (10 mg total) by mouth daily. 03/26/22 03/21/23  Donato Heinz, MD      Allergies    Penicillins, Quinolones, and Sulfamethoxazole    Review of Systems   Review of Systems  Unable to perform ROS: Dementia    Physical Exam Updated Vital Signs BP 139/63   Pulse 89   Temp (!) 101.2 F (38.4 C) (Oral)   Resp 19   Ht 5\' 6"  (1.676 m)   Wt 63 kg   SpO2 100%   BMI 22.42 kg/m  Physical Exam Vitals and nursing note reviewed.   76 year old female, resting comfortably and in no acute distress. Vital signs are significant for elevated temperature. Oxygen saturation is 100%, which is normal. Head is normocephalic and atraumatic. PERRLA, EOMI. Oropharynx is clear. Neck is nontender and supple without adenopathy or JVD. Back is nontender and there is no CVA tenderness. Lungs are clear without rales, wheezes, or rhonchi. Chest is nontender. Heart has regular rate and rhythm without murmur. Abdomen is soft, flat, nontender. Extremities have no cyanosis or edema, full range of motion is present. Skin is warm and dry without rash. Neurologic: Awake and alert.  Oriented to person but not place or time.  Cranial nerves are intact.  Moves all extremities equally.  ED Results / Procedures / Treatments   Labs (all labs ordered are listed, but only abnormal results are displayed) Labs  Reviewed  LACTIC ACID, PLASMA - Abnormal; Notable for the following components:      Result Value   Lactic Acid, Venous 2.2 (*)    All other components within normal limits  CBC WITH DIFFERENTIAL/PLATELET - Abnormal; Notable for the following components:   WBC 17.0 (*)    RDW 16.1 (*)    Neutro Abs 15.1 (*)    Abs Immature Granulocytes 0.09 (*)    All other components within normal limits  CULTURE, BLOOD (ROUTINE X 2)  CULTURE, BLOOD (ROUTINE X 2)  URINE CULTURE  SARS CORONAVIRUS 2 BY RT PCR  COMPREHENSIVE METABOLIC PANEL  PROTIME-INR  APTT  URINALYSIS, ROUTINE W REFLEX MICROSCOPIC   LACTIC ACID, PLASMA    EKG EKG Interpretation  Date/Time:  Friday June 12 2022 04:58:29 EDT Ventricular Rate:  95 PR Interval:  174 QRS Duration: 119 QT Interval:  361 QTC Calculation: 454 R Axis:   57 Text Interpretation: Sinus rhythm Ventricular premature complex Nonspecific intraventricular conduction delay Low voltage, extremity leads Nonspecific T abnormalities, lateral leads When compared with ECG of 11/13/2021, No significant change was found Confirmed by Delora Fuel ([id number]) on 06/12/2022 5:15:04 AM  Radiology DG Chest Port 1 View  Result Date: 06/12/2022 CLINICAL DATA:  76 year old female with possible sepsis. EXAM: PORTABLE CHEST 1 VIEW COMPARISON:  Chest x-ray 11/13/2021. FINDINGS: Lung volumes are slightly low. No consolidative airspace disease. No pleural effusions. No pneumothorax. No evidence of pulmonary edema. Heart size is upper limits of normal. The patient is rotated to the right on today's exam, resulting in distortion of the mediastinal contours and reduced diagnostic sensitivity and specificity for mediastinal pathology. Atherosclerotic calcifications in the thoracic aorta. IMPRESSION: 1. No radiographic evidence of acute cardiopulmonary disease. 2. Aortic atherosclerosis. Electronically Signed   By: Vinnie Langton M.D.   On: 06/12/2022 06:01    Procedures Procedures  Cardiac monitor shows normal sinus rhythm, per my interpretation.  Medications Ordered in ED Medications  lactated ringers infusion ( Intravenous New Bag/Given 06/12/22 0740)  lactated ringers bolus 1,000 mL (0 mLs Intravenous Stopped 06/12/22 0735)    And  lactated ringers bolus 1,000 mL (1,000 mLs Intravenous New Bag/Given 06/12/22 0739)  metroNIDAZOLE (FLAGYL) IVPB 500 mg (500 mg Intravenous New Bag/Given 06/12/22 0709)  vancomycin (VANCOREADY) IVPB 1250 mg/250 mL (1,250 mg Intravenous New Bag/Given 06/12/22 0740)  vancomycin (VANCOREADY) IVPB 1250 mg/250 mL (has no administration in time  range)  ceFEPIme (MAXIPIME) 2 g in sodium chloride 0.9 % 100 mL IVPB (has no administration in time range)  acetaminophen (TYLENOL) tablet 650 mg (650 mg Oral Given 06/12/22 0535)  ceFEPIme (MAXIPIME) 2 g in sodium chloride 0.9 % 100 mL IVPB (0 g Intravenous Stopped 06/12/22 0735)    ED Course/ Medical Decision Making/ A&P                           Medical Decision Making Amount and/or Complexity of Data Reviewed Labs: ordered. Radiology: ordered.  Risk OTC drugs. Prescription drug management. Decision regarding hospitalization.   Fever in patient who has been on antibiotics for urinary tract infection, rule out sepsis.  I have started her on the evolving sepsis pathway.  Chest x-ray shows no evidence of pneumonia.  I have independently viewed the image, and agree with the radiologist's interpretation.  I have independently reviewed and interpreted her electrocardiogram, and my interpretation is nonspecific T wave changes which are unchanged from prior.  I have reviewed and interpreted her  laboratory tests, and my interpretation is normal comprehensive metabolic panel, moderate leukocytosis with left shift, normal hemoglobin, normal coagulation studies, mildly elevated lactic acid level concerning for sepsis, normal urinalysis.  At this time, I see no evidence of urinary tract infection, but evidence of sepsis with fever and leukocytosis and elevated lactic acid level.  Therefore, I have ordered early goal-directed fluids and antibiotics for sepsis of undetermined origin.  Case is discussed with Dr. Lorin Mercy of [facility], who agrees to admit the patient.  CRITICAL CARE Performed by: Delora Fuel Total critical care time: 60 minutes Critical care time was exclusive of separately billable procedures and treating other patients. Critical care was necessary to treat or prevent imminent or life-threatening deterioration. Critical care was time spent personally by me on the following  activities: development of treatment plan with patient and/or surrogate as well as nursing, discussions with consultants, evaluation of patient's response to treatment, examination of patient, obtaining history from patient or surrogate, ordering and performing treatments and interventions, ordering and review of laboratory studies, ordering and review of radiographic studies, pulse oximetry and re-evaluation of patient's condition.  Final Clinical Impression(s) / ED Diagnoses Final diagnoses:  Sepsis due to undetermined organism [facility])    Rx / DC Orders ED Discharge Orders     None         Delora Fuel, MD 34/19/37 [phone]

## 2022-06-13 DIAGNOSIS — A419 Sepsis, unspecified organism: Secondary | ICD-10-CM | POA: Diagnosis not present

## 2022-06-13 LAB — CBC
HCT: 31 % — ABNORMAL LOW (ref 36.0–46.0)
Hemoglobin: 10.2 g/dL — ABNORMAL LOW (ref 12.0–15.0)
MCH: 29.7 pg (ref 26.0–34.0)
MCHC: 32.9 g/dL (ref 30.0–36.0)
MCV: 90.4 fL (ref 80.0–100.0)
Platelets: 292 10*3/uL (ref 150–400)
RBC: 3.43 MIL/uL — ABNORMAL LOW (ref 3.87–5.11)
RDW: 16 % — ABNORMAL HIGH (ref 11.5–15.5)
WBC: 11.7 10*3/uL — ABNORMAL HIGH (ref 4.0–10.5)
nRBC: 0 % (ref 0.0–0.2)

## 2022-06-13 LAB — BASIC METABOLIC PANEL
Anion gap: 12 (ref 5–15)
BUN: 13 mg/dL (ref 8–23)
CO2: 18 mmol/L — ABNORMAL LOW (ref 22–32)
Calcium: 9.1 mg/dL (ref 8.9–10.3)
Chloride: 111 mmol/L (ref 98–111)
Creatinine, Ser: 0.69 mg/dL (ref 0.44–1.00)
GFR, Estimated: >60 mL/min (ref >60)
Glucose, Bld: 108 mg/dL — ABNORMAL HIGH (ref 70–99)
Potassium: 3.6 mmol/L (ref 3.5–5.1)
Sodium: 141 mmol/L (ref 135–145)

## 2022-06-13 LAB — URINE CULTURE: Culture: NO GROWTH

## 2022-06-13 NOTE — Evaluation (Signed)
Physical Therapy Evaluation Patient Details Name: Colleen Parker MRN: [id number] DOB: [birth year] Today's Date: 06/13/2022  History of Present Illness  76 y/o female presented to ED on 06/12/22 for SOB and fever. Found to have sepsis 2/2 UTI. PMH: dementia, carotid stenosis, AAA  Clinical Impression  Patient admitted with the above. Per chart review, patient lives with sister and requires assistance for use of RW but uses w/c for longer distances. Patient presents with weakness, decreased activity tolerance, impaired balance, and baseline cognitive deficits. Patient requiring minA for bed mobility and modA for sit to stand transfer. She ambulated 58' with minA and use of RW. Oriented to self which seems to be baseline for her 2/2 dementia. Patient will benefit from skilled PT services during acute stay to address listed deficits. Recommend HHPT at discharge to maximize functional independence and safety.        Recommendations for follow up therapy are one component of a multi-disciplinary discharge planning process, led by the attending physician.  Recommendations may be updated based on patient status, additional functional criteria and insurance authorization.  Follow Up Recommendations Home health PT      Assistance Recommended at Discharge Frequent or constant Supervision/Assistance  Patient can return home with the following  A little help with walking and/or transfers;A lot of help with bathing/dressing/bathroom;Assistance with cooking/housework;Direct supervision/assist for medications management;Direct supervision/assist for financial management;Assist for transportation    Equipment Recommendations None recommended by PT  Recommendations for Other Services       Functional Status Assessment Patient has had a recent decline in their functional status and demonstrates the ability to make significant improvements in function in a reasonable and predictable amount of time.      Precautions / Restrictions Precautions Precautions: Fall Restrictions Weight Bearing Restrictions: No      Mobility  Bed Mobility Overal bed mobility: Needs Assistance Bed Mobility: Supine to Sit, Sit to Supine     Supine to sit: Min assist Sit to supine: Min assist   General bed mobility comments: assist for repositioning hips towards EOB and bringing LEs back into bed    Transfers Overall transfer level: Needs assistance Equipment used: Rolling Guerin [place] (2 wheels) Transfers: Sit to/from Stand Sit to Stand: Mod assist           General transfer comment: modA to stand from low bed surface. Would do better with slightly elevated surface    Ambulation/Gait Ambulation/Gait assistance: Min assist Gait Distance (Feet): 70 Feet Assistive device: Rolling Jasiah Elsen (2 wheels) Gait Pattern/deviations: Step-through pattern, Decreased stride length, Trunk flexed Gait velocity: decreased     General Gait Details: assist for balance and RW management as patient tends to pick RW up and step outside of RW.  Stairs            Wheelchair Mobility    Modified Rankin (Stroke Patients Only)       Balance Overall balance assessment: Needs assistance Sitting-balance support: No upper extremity supported, Feet supported Sitting balance-Leahy Scale: Fair     Standing balance support: Bilateral upper extremity supported, Reliant on assistive device for balance Standing balance-Leahy Scale: Poor                               Pertinent Vitals/Pain Pain Assessment Pain Assessment: No/denies pain    Home Living Family/patient expects to be discharged to:: Private residence Living Arrangements: Other relatives Available Help at Discharge: Family Type of Home: [place]  Access: Ramped entrance       Home Layout: Two level;Able to live on main level with bedroom/bathroom Home Equipment: Wheelchair - Publishing copy (2 wheels);BSC/3in1 Additional  Comments: all information obtained from 10/2021 admission due to patient being poor historian 2/2 dementia    Prior Function Prior Level of Function : Needs assist             Mobility Comments: Uses WC for longer distances and sister assists with ambulation inside of home ADLs Comments: Requires assist for ADL tasks     Hand Dominance        Extremity/Trunk Assessment   Upper Extremity Assessment Upper Extremity Assessment: Defer to OT evaluation    Lower Extremity Assessment Lower Extremity Assessment: Generalized weakness    Cervical / Trunk Assessment Cervical / Trunk Assessment: Kyphotic  Communication   Communication: No difficulties  Cognition Arousal/Alertness: Awake/alert Behavior During Therapy: WFL for tasks assessed/performed Overall Cognitive Status: History of cognitive impairments - at baseline                                 General Comments: Oriented to self but pleasantly confused. Very sweet and cooperative        General Comments      Exercises     Assessment/Plan    PT Assessment Patient needs continued PT services  PT Problem List Decreased strength;Decreased activity tolerance;Decreased balance;Decreased mobility;Decreased cognition;Decreased knowledge of use of DME;Decreased safety awareness       PT Treatment Interventions DME instruction;Gait training;Functional mobility training;Therapeutic activities;Therapeutic exercise;Balance training;Patient/family education    PT Goals (Current goals can be found in the Care Plan section)  Acute Rehab PT Goals Patient Stated Goal: did not state PT Goal Formulation: Patient unable to participate in goal setting Time For Goal Achievement: 06/27/22 Potential to Achieve Goals: Fair    Frequency Min 3X/week     Co-evaluation               AM-PAC PT "6 Clicks" Mobility  Outcome Measure Help needed turning from your back to your side while in a flat bed without using  bedrails?: A Little Help needed moving from lying on your back to sitting on the side of a flat bed without using bedrails?: A Little Help needed moving to and from a bed to a chair (including a wheelchair)?: A Little Help needed standing up from a chair using your arms (e.g., wheelchair or bedside chair)?: A Lot Help needed to walk in hospital room?: A Little Help needed climbing 3-5 steps with a railing? : A Lot 6 Click Score: 16    End of Session Equipment Utilized During Treatment: Gait belt Activity Tolerance: Patient tolerated treatment well Patient left: in bed;with call bell/phone within reach;with bed alarm set Nurse Communication: Mobility status PT Visit Diagnosis: Muscle weakness (generalized) (M62.81);Unsteadiness on feet (R26.81)    Time: [id number] PT Time Calculation (min) (ACUTE ONLY): 12 min   Charges:   PT Evaluation $PT Eval Low Complexity: 1 Low          Estle Huguley A. Gilford Rile PT, DPT Acute Rehabilitation Services Office [phone]   Linna Hoff 06/13/2022, 9:35 AM

## 2022-06-13 NOTE — Evaluation (Signed)
Occupational Therapy Evaluation Patient Details Name: Colleen Parker MRN: [id number] DOB: [birth year] Today's Date: 06/13/2022   History of Present Illness 76 y/o female presented to ED on 06/12/22 for SOB and fever. Found to have sepsis 2/2 UTI. PMH: dementia, carotid stenosis, AAA   Clinical Impression   Pt presents with decline in function and safety with ADLs and ADL mobility with impaired strength, balance and endurance. Pt would benefit from acute OT services to maximize level of function and safety. Pt currently requires mod A with LB ADLS, min guard A with grooming and UB ADLs, mod A with toilet transfers and min A with toileting tasks.       Recommendations for follow up therapy are one component of a multi-disciplinary discharge planning process, led by the attending physician.  Recommendations may be updated based on patient status, additional functional criteria and insurance authorization.   Follow Up Recommendations  Home health OT    Assistance Recommended at Discharge Frequent or constant Supervision/Assistance  Patient can return home with the following A little help with bathing/dressing/bathroom;A little help with walking and/or transfers;Direct supervision/assist for medications management;Direct supervision/assist for financial management;Assist for transportation    Functional Status Assessment  Patient has had a recent decline in their functional status and demonstrates the ability to make significant improvements in function in a reasonable and predictable amount of time.  Equipment Recommendations  None recommended by OT    Recommendations for Other Services       Precautions / Restrictions Precautions Precautions: Fall Restrictions Weight Bearing Restrictions: No      Mobility Bed Mobility Overal bed mobility: Needs Assistance Bed Mobility: Supine to Sit, Sit to Supine     Supine to sit: Min assist Sit to supine: Min assist   General bed mobility  comments: min A  to scoot hips forward at EOB and with LEs back onto bed    Transfers Overall transfer level: Needs assistance Equipment used: Rolling walker (2 wheels) Transfers: Sit to/from Stand Sit to Stand: Mod assist                  Balance Overall balance assessment: Needs assistance Sitting-balance support: No upper extremity supported, Feet supported Sitting balance-Leahy Scale: Fair     Standing balance support: Bilateral upper extremity supported, Reliant on assistive device for balance Standing balance-Leahy Scale: Poor                             ADL either performed or assessed with clinical judgement   ADL Overall ADL's : Needs assistance/impaired Eating/Feeding: Independent;Sitting   Grooming: Wash/dry hands;Wash/dry face;Min guard;Standing   Upper Body Bathing: Min guard;Sitting   Lower Body Bathing: Moderate assistance   Upper Body Dressing : Min guard;Sitting   Lower Body Dressing: Moderate assistance   Toilet Transfer: Moderate assistance;Cueing for safety;Stand-pivot;Grab bars;Rolling walker (2 wheels)   Toileting- Clothing Manipulation and Hygiene: Minimal assistance;Sit to/from stand       Functional mobility during ADLs: Moderate assistance;Minimal assistance;Cueing for safety;Cueing for sequencing;Rolling walker (2 wheels)       Vision Patient Visual Report: No change from baseline       Perception     Praxis      Pertinent Vitals/Pain Pain Assessment Pain Assessment: No/denies pain     Hand Dominance Right   Extremity/Trunk Assessment Upper Extremity Assessment Upper Extremity Assessment: Generalized weakness   Lower Extremity Assessment Lower Extremity Assessment: Defer to PT evaluation  Cervical / Trunk Assessment Cervical / Trunk Assessment: Kyphotic   Communication Communication Communication: No difficulties   Cognition Arousal/Alertness: Awake/alert Behavior During Therapy: WFL for tasks  assessed/performed Overall Cognitive Status: History of cognitive impairments - at baseline                                 General Comments: Oriented to self but pleasantly confused. Very sweet and cooperative     General Comments       Exercises     Shoulder Instructions      Home Living Family/patient expects to be discharged to:: Private residence Living Arrangements: Other relatives (sister) Available Help at Discharge: Family Type of Home: House Home Access: Ramped entrance     [place]: Two level;Able to live on main level with bedroom/bathroom     Bathroom Shower/Tub: Occupational psychologist: Standard     Home Equipment: Wheelchair - Publishing copy (2 wheels);BSC/3in1   Additional Comments: all information obtained from 10/2021 admission due to patient being poor historian 2/2 dementia      Prior Functioning/Environment Prior Level of Function : Needs assist             Mobility Comments: Uses WC for longer distances and sister assists with ambulation inside of home ADLs Comments: Requires assist for ADL tasks, although pt states that she was Ind        OT Problem List: Decreased strength;Impaired balance (sitting and/or standing);Decreased cognition;Decreased safety awareness;Decreased activity tolerance;Decreased coordination;Decreased knowledge of use of DME or AE      OT Treatment/Interventions: Self-care/ADL training;Therapeutic exercise;Patient/family education;Balance training;Neuromuscular education;Therapeutic activities;DME and/or AE instruction    OT Goals(Current goals can be found in the care plan section) Acute Rehab OT Goals Patient Stated Goal: go home OT Goal Formulation: With patient Time For Goal Achievement: 06/27/22 ADL Goals Pt Will Perform Grooming: with supervision;with set-up;standing Pt Will Perform Upper Body Bathing: with supervision;with set-up Pt Will Perform Lower Body Bathing: with min  assist;with min guard assist Pt Will Perform Upper Body Dressing: with supervision;with set-up Pt Will Perform Lower Body Dressing: with min assist;with min guard assist Pt Will Transfer to Toilet: with min assist;with min guard assist;ambulating;grab bars Pt Will Perform Toileting - Clothing Manipulation and hygiene: with min guard assist;sit to/from stand  OT Frequency: Min 2X/week    Co-evaluation              AM-PAC OT "6 Clicks" Daily Activity     Outcome Measure Help from another person eating meals?: None Help from another person taking care of personal grooming?: A Little Help from another person toileting, which includes using toliet, bedpan, or urinal?: A Little Help from another person bathing (including washing, rinsing, drying)?: A Little Help from another person to put on and taking off regular upper body clothing?: A Little Help from another person to put on and taking off regular lower body clothing?: A Little 6 Click Score: 19   End of Session Equipment Utilized During Treatment: Gait belt;Rolling walker (2 wheels)  Activity Tolerance: Patient tolerated treatment well Patient left: in bed;with call bell/phone within reach;with bed alarm set  OT Visit Diagnosis: Unsteadiness on feet (R26.81);Other abnormalities of gait and mobility (R26.89);Muscle weakness (generalized) (M62.81);Other symptoms and signs involving cognitive function                Time: 3419-3790 OT Time Calculation (min): 23 min Charges:  OT  General Charges $OT Visit: 1 Visit OT Evaluation $OT Eval Low Complexity: 1 Low OT Treatments $Self Care/Home Management : 8-22 mins    Galen Manila 06/13/2022, 12:13 PM

## 2022-06-13 NOTE — Progress Notes (Signed)
PROGRESS NOTE    Colleen Parker  FUX:[id number] DOB: [birth year] DOA: 06/12/2022 PCP: Margretta Sidle, MD   Brief Narrative:  Colleen Parker is a 76 y.o. female with medical history significant of advanced dementia; carotid stenosis; and AAA presenting with fever and dyspnea with exertion. Prior UTI/bladder infection 2 weeks ago and never felt that she fully recovered. Was previously on Macrobid and transitioned to cefpodoxime for 7 days total. Urine remained abnormal concerning for ongoing UTI and was placed back on Macrobid.  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   Dementia - She has primary progressive aphasia - Alert oriented to person place and general situation - Continue Namenda   ASCVD - Carotid stenosis, AAA - followed by vascular surgery and CVTS - Continue ASA and rosuvastatin   Goals of care - Continues as DNR (prior to admission)   DVT prophylaxis: Lovenox Code Status: DNR Family Communication: None present  Status is: Inpatient  Dispo: The patient is from: Home              Anticipated d/c is to: Home              Anticipated d/c date is: 24 to 48 hours              Patient currently not medically stable for discharge, continued work-up for potential underlying infection and IV  antibiotics  Consultants:  None  Procedures:  None  Antimicrobials:  Cefepime, vancomycin  Subjective: No acute issues or events overnight, has no complaints at this time, requesting discharge home which we discussed would be reasonable in the next 24 to 48 hours pending clinical course.  Denies nausea vomiting diarrhea constipation headache fevers chills chest pain shortness of breath cough or sputum production.  Objective: Vitals:   06/12/22 1730 06/12/22 1900 06/12/22 2310 06/13/22 0419  BP:  107/62 110/62 (!) 113/55  Pulse: 78 93 88 66  Resp:  18 16 17   Temp: 97.9 F (36.6 C) 98 F (36.7 C) 98.5 F (36.9 C) 98.5 F (36.9 C)  TempSrc: Oral Oral Oral Oral  SpO2: 100% 100% 100% 100%  Weight:      Height:        Intake/Output Summary (Last 24 hours) at 06/13/2022 0756 Last data filed at 06/12/2022 2231 Gross per 24 hour  Intake 751.33 ml  Output --  Net 751.33 ml   Filed Weights   06/12/22 0454  Weight: 63 kg    Examination: General:  Pleasantly resting in bed, No acute distress. HEENT:  Normocephalic atraumatic.  Sclerae nonicteric, noninjected.  Extraocular movements intact bilaterally. Neck:  Without mass or deformity.  Trachea is midline. Lungs:  Clear to auscultate bilaterally without rhonchi, wheeze, or rales. Heart:  Regular rate and rhythm.  Without murmurs, rubs, or gallops. Abdomen:  Soft, nontender, nondistended.  Without guarding or rebound. Extremities:  Without cyanosis, clubbing, edema, or obvious deformity. Vascular:  Dorsalis pedis and posterior tibial pulses palpable bilaterally. Skin:  Warm and dry, no erythema, no ulcerations.  Data Reviewed: I have personally reviewed following labs and imaging studies  CBC: Recent Labs  Lab 06/12/22 0537 06/13/22 0040  WBC 17.0* 11.7*  NEUTROABS 15.1*  --   HGB 12.4 10.2*  HCT 38.6 31.0*  MCV 92.3 90.4  PLT 321 [id number]   Basic Metabolic Panel: Recent Labs  Lab 06/12/22 0537 06/13/22 0040  NA 140  141  K 4.0 3.6  CL 106 111  CO2 24 18*  GLUCOSE 97 108*  BUN 18 13  CREATININE 0.80 0.69  CALCIUM 10.0 9.1   GFR: Estimated Creatinine Clearance: 57.8 mL/min (by C-G formula based on SCr of 0.69 mg/dL). Liver Function Tests: Recent Labs  Lab 06/12/22 0537  AST 25  ALT 18  ALKPHOS 71  BILITOT 0.5  PROT 7.5  ALBUMIN 3.7   No results for input(s): "LIPASE", "AMYLASE" in the last 168 hours. No results for input(s): "AMMONIA" in the last 168 hours. Coagulation Profile: Recent Labs  Lab 06/12/22 0537  INR 1.0   Cardiac Enzymes: No results for input(s): "CKTOTAL", "CKMB", "CKMBINDEX", "TROPONINI" in the last 168 hours. BNP (last 3 results) No results for input(s): "PROBNP" in the last 8760 hours. HbA1C: No results for input(s): "HGBA1C" in the last 72 hours. CBG: No results for input(s): "GLUCAP" in the last 168 hours. Lipid Profile: No results for input(s): "CHOL", "HDL", "LDLCALC", "TRIG", "CHOLHDL", "LDLDIRECT" in the last 72 hours. Thyroid Function Tests: No results for input(s): "TSH", "T4TOTAL", "FREET4", "T3FREE", "THYROIDAB" in the last 72 hours. Anemia Panel: No results for input(s): "VITAMINB12", "FOLATE", "FERRITIN", "TIBC", "IRON", "RETICCTPCT" in the last 72 hours. Sepsis Labs: Recent Labs  Lab 06/12/22 0537 06/12/22 0741  LATICACIDVEN 2.2* 1.5    Recent Results (from the past 240 hour(s))  Blood Culture (routine x 2)     Status: None (Preliminary result)   Collection Time: 06/12/22  5:37 AM   Specimen: BLOOD  Result Value Ref Range Status   Specimen Description BLOOD SITE NOT SPECIFIED  Final   Special Requests   Final    BOTTLES DRAWN AEROBIC AND ANAEROBIC Blood Culture adequate volume   Culture   Final    NO GROWTH 1 DAY Performed at [facility], [address] [place], [place] [address]    Report Status PENDING  Incomplete  Blood Culture (routine x 2)     Status: None (Preliminary result)   Collection Time: 06/12/22  5:38 AM    Specimen: BLOOD LEFT FOREARM  Result Value Ref Range Status   Specimen Description BLOOD LEFT FOREARM  Final   Special Requests   Final    BOTTLES DRAWN AEROBIC AND ANAEROBIC Blood Culture results may not be optimal due to an excessive volume of blood received in culture bottles   Culture   Final    NO GROWTH 1 DAY Performed at [facility], [place] [address], [place], [place] [address]    Report Status PENDING  Incomplete  SARS Coronavirus 2 by RT PCR (hospital order, performed in [facility] hospital lab) *cepheid single result test* Anterior Nasal Swab     Status: None   Collection Time: 06/12/22  7:33 AM   Specimen: Anterior Nasal Swab  Result Value Ref Range Status   SARS Coronavirus 2 by RT PCR NEGATIVE NEGATIVE Final    Comment: (NOTE) SARS-CoV-2 target nucleic acids  are NOT DETECTED.  The SARS-CoV-2 RNA is generally detectable in upper and lower respiratory specimens during the acute phase of infection. The lowest concentration of SARS-CoV-2 viral copies this assay can detect is 250 copies / mL. A negative result does not preclude SARS-CoV-2 infection and should not be used as the sole basis for treatment or other patient management decisions.  A negative result may occur with improper specimen collection / handling, submission of specimen other than nasopharyngeal swab, presence of viral mutation(s) within the areas targeted by this assay, and inadequate number of viral copies (<250 copies / mL). A negative result must be combined with clinical observations, patient history, and epidemiological information.  Fact Sheet for Patients:   https://www.patel.info/  Fact Sheet for Healthcare Providers: https://hall.com/  This test is not yet approved or  cleared by the [place] FDA and has been authorized for detection and/or diagnosis of SARS-CoV-2 by FDA under an Emergency Use Authorization (EUA).  This EUA will remain in  effect (meaning this test can be used) for the duration of the COVID-19 declaration under Section 564(b)(1) of the Act, 21 U.S.C. section 360bbb-3(b)(1), unless the authorization is terminated or revoked sooner.  Performed at [facility], [place] [address], [place], [place] [address]     Radiology Studies: DG Chest [place] 1 View  Result Date: 06/12/2022 CLINICAL DATA:  76 year old female with possible sepsis. EXAM: PORTABLE CHEST 1 VIEW COMPARISON:  Chest x-ray 11/13/2021. FINDINGS: Lung volumes are slightly low. No consolidative airspace disease. No pleural effusions. No pneumothorax. No evidence of pulmonary edema. Heart size is upper limits of normal. The patient is rotated to the right on today's exam, resulting in distortion of the mediastinal contours and reduced diagnostic sensitivity and specificity for mediastinal pathology. Atherosclerotic calcifications in the thoracic aorta. IMPRESSION: 1. No radiographic evidence of acute cardiopulmonary disease. 2. Aortic atherosclerosis. Electronically Signed   By: Vinnie Langton M.D.   On: 06/12/2022 06:01    Scheduled Meds:  aspirin EC  81 mg Oral Daily   docusate sodium  100 mg Oral BID   enoxaparin (LOVENOX) injection  40 mg Subcutaneous Q24H   memantine  10 mg Oral BID   rosuvastatin  10 mg Oral Daily   sodium chloride flush  3 mL Intravenous Q12H   Continuous Infusions:  ceFEPime (MAXIPIME) IV Stopped (06/13/22 0551)   lactated ringers 100 mL/hr at 06/12/22 0932   vancomycin 1,250 mg (06/13/22 0550)    LOS: 1 day   Time spent: 23min  Colleen Hillenburg C Kayliana Codd, DO [facility]  If 7PM-7AM, please contact night-coverage [email or web address]  06/13/2022, 7:56 AM

## 2022-06-14 DIAGNOSIS — A419 Sepsis, unspecified organism: Secondary | ICD-10-CM | POA: Diagnosis not present

## 2022-06-14 MED ORDER — SENNA-DOCUSATE SODIUM 8.6-50 MG PO TABS: 1.0000 | ORAL_TABLET | Freq: Every day | ORAL | 0 refills | Status: AC

## 2022-06-14 MED ORDER — CULTURELLE PROBIOTICS PO CHEW: 1.0000 | CHEWABLE_TABLET | Freq: Every day | ORAL | 0 refills | Status: AC

## 2022-06-14 MED ORDER — CEFDINIR 300 MG PO CAPS: 300.0000 mg | ORAL_CAPSULE | Freq: Two times a day (BID) | ORAL | 0 refills | Status: AC

## 2022-06-14 NOTE — Discharge Summary (Signed)
Physician Discharge Summary  Colleen Parker:[id number] DOB: Apr 10, 1946 DOA: 06/12/2022  PCP: Lula Olszewski, MD  Admit date: 06/12/2022 Discharge date: 06/14/2022  Admitted From: Home Disposition: Home  Recommendations for Outpatient Follow-up:  Follow up with PCP in 1-2 weeks  Home Health: None Equipment/Devices: None  Discharge Condition: Stable CODE STATUS: DNR Diet recommendation: As tolerated  Brief/Interim Summary: Colleen Parker is a 76 y.o. female with medical history significant of advanced dementia; carotid stenosis; and AAA presenting with fever and dyspnea with exertion. Prior UTI/bladder infection 2 weeks ago and never felt that she fully recovered. Was previously on Macrobid and transitioned to cefpodoxime for 7 days total. Urine remained abnormal concerning for ongoing UTI and was placed back on Macrobid.  Patient admitted as above with concern for possible sepsis and transient metabolic encephalopathy versus fever at home.  Patient's cultures and imaging appear negative at intake, no further signs or symptoms of any infection, presumed patient had failure of outpatient antibiotics with Macrobid.  Will discharge as below on new regimen.  We also discussed with patient's daughter who is primary caretaker about penicillin testing with PCP as this allergy comes from her childhood and is likely not an anaphylactic reaction given history of shortness of breath and patient has tolerated multiple cephalosporins in the recent past without any difficulty.  Having this testing done would assist patient in antibiotic choices in the near future given her chronic recurrent UTI issues.  Per family bedside patient appears back to baseline, otherwise stable and agreeable for discharge home.  Discharge Diagnoses:  Principal Problem:   Sepsis due to undetermined organism [facility]) Active Problems:   Dementia (HCC)   Primary progressive aphasia (HCC)   ASCVD (arteriosclerotic cardiovascular  disease)   DNR (do not resuscitate)   Sepsis secondary to presumed UTI, failure of outpatient therapy Cannot rule out secondary sources of infection - SIRS criteria in this patient includes: Leukocytosis, fever, tachycardia, tachypnea   - Source presumed UTI, cultures pending - Failure of macrobid/cefpodoxime - Continue cefdinir x2 days to complete 3-day course   Dementia, at baseline - She has primary progressive aphasia - Alert oriented to person place and general situation -at baseline per family - Continue Namenda   ASCVD - Carotid stenosis, AAA - followed by vascular surgery and CVTS - Continue ASA and rosuvastatin   Goals of care - Continues as DNR (prior to admission)   Discharge Instructions  Discharge Instructions     Discharge patient   Complete by: As directed    Discharge disposition: 06-Home-Health Care Svc   Discharge patient date: 06/14/2022   Face-to-face encounter (required for Medicare/Medicaid patients)   Complete by: As directed    I Azucena Fallen certify that this patient is under my care and that I, or a nurse practitioner or physician's assistant working with me, had a face-to-face encounter that meets the physician face-to-face encounter requirements with this patient on 06/14/2022. The encounter with the patient was in whole, or in part for the following medical condition(s) which is the primary reason for home health care (List medical condition): Ambulatory dysfunction   The encounter with the patient was in whole, or in part, for the following medical condition, which is the primary reason for home health care: ambulatory dysfunction   I certify that, based on my findings, the following services are medically necessary home health services: Physical therapy   Reason for Medically Necessary Home Health Services:  Therapy- Instruction on use of Assistive Device for  Ambulation on all Surfaces Therapy- Instruction on Safe use of Assistive Devices for  ADLs     My clinical findings support the need for the above services: Unable to leave home safely without assistance and/or assistive device   Further, I certify that my clinical findings support that this patient is homebound due to: Unable to leave home safely without assistance   Home Health   Complete by: As directed    To provide the following care/treatments: PT      Allergies as of 06/14/2022       Reactions   Penicillins Shortness Of Breath   Quinolones Other (See Comments)   Aneurysm   Sulfamethoxazole Hives, Swelling        Medication List     STOP taking these medications    nitrofurantoin (macrocrystal-monohydrate) 100 MG capsule Commonly known as: MACROBID       TAKE these medications    acetaminophen 500 MG tablet Commonly known as: TYLENOL Take 1,000 mg by mouth as needed for mild pain or headache.   albuterol 108 (90 Base) MCG/ACT inhaler Commonly known as: VENTOLIN HFA Inhale 2 puffs into the lungs 3 (three) times daily as needed for wheezing or shortness of breath.   alendronate 70 MG tablet Commonly known as: FOSAMAX Take 70 mg by mouth once a week.   aspirin EC 81 MG tablet Take 81 mg by mouth daily. Swallow whole.   AZO-CRANBERRY PO Take 1 tablet by mouth in the morning and at bedtime.   cefdinir 300 MG capsule Commonly known as: OMNICEF Take 1 capsule (300 mg total) by mouth 2 (two) times daily for 2 days.   cholecalciferol 25 MCG (1000 UNIT) tablet Commonly known as: VITAMIN D3 Take 1,000 Units by mouth daily.   Culturelle Probiotics Chew Chew 1 tablet by mouth daily.   loratadine 10 MG tablet Commonly known as: CLARITIN Take 10 mg by mouth daily as needed for allergies.   meloxicam 15 MG tablet Commonly known as: MOBIC Take 15 mg by mouth as needed for pain.   memantine 10 MG tablet Commonly known as: NAMENDA Take 1 tablet (10 mg at night) for 2 weeks, then increase to 1 tablet (10 mg) twice a day What changed:  how  much to take how to take this when to take this additional instructions   rosuvastatin 10 MG tablet Commonly known as: CRESTOR Take 1 tablet (10 mg total) by mouth daily.   sennosides-docusate sodium 8.6-50 MG tablet Commonly known as: SENOKOT-S Take 1 tablet by mouth daily.        Allergies  Allergen Reactions   Penicillins Shortness Of Breath   Quinolones Other (See Comments)    Aneurysm   Sulfamethoxazole Hives and Swelling    Consultations: None  Procedures/Studies: DG Chest Port 1 View  Result Date: 06/12/2022 CLINICAL DATA:  76 year old female with possible sepsis. EXAM: PORTABLE CHEST 1 VIEW COMPARISON:  Chest x-ray 11/13/2021. FINDINGS: Lung volumes are slightly low. No consolidative airspace disease. No pleural effusions. No pneumothorax. No evidence of pulmonary edema. Heart size is upper limits of normal. The patient is rotated to the right on today's exam, resulting in distortion of the mediastinal contours and reduced diagnostic sensitivity and specificity for mediastinal pathology. Atherosclerotic calcifications in the thoracic aorta. IMPRESSION: 1. No radiographic evidence of acute cardiopulmonary disease. 2. Aortic atherosclerosis. Electronically Signed   By: Trudie Reed M.D.   On: 06/12/2022 06:01   VAS US CAROTID  Result Date: 05/22/2022 Carotid Arterial Duplex  Study Patient Name:  ASHLEAH VALTIERRA  Date of Exam:   05/22/2022 Medical Rec #: [id number]     Accession #:    [id number] Date of Birth: 1945-12-28      Patient Gender: F Patient Age:   43 years Exam Location:  [facility] Procedure:      VAS US CAROTID Referring Phys: Heath Lark --------------------------------------------------------------------------------  Indications:   Severe left internal carotid stenosis observed on CT of chest. Other Factors: Thoracic aneurysm. Limitations    Today's exam was limited due to Patient positioning. Performing Technologist: Dorthula Matas RVS, RCS   Examination Guidelines: A complete evaluation includes B-mode imaging, spectral Doppler, color Doppler, and power Doppler as needed of all accessible portions of each vessel. Bilateral testing is considered an integral part of a complete examination. Limited examinations for reoccurring indications may be performed as noted.  Right Carotid Findings: +----------+--------+--------+--------+------------------+--------+           PSV cm/sEDV cm/sStenosisPlaque DescriptionComments +----------+--------+--------+--------+------------------+--------+ CCA Prox  44      10                                         +----------+--------+--------+--------+------------------+--------+ CCA Mid   49      9                                          +----------+--------+--------+--------+------------------+--------+ CCA Distal29      9               calcific                   +----------+--------+--------+--------+------------------+--------+ ICA Prox  37      9       1-39%   calcific                   +----------+--------+--------+--------+------------------+--------+ ICA Mid   62      17                                         +----------+--------+--------+--------+------------------+--------+ ICA Distal66      17                                         +----------+--------+--------+--------+------------------+--------+ ECA       62      5                                          +----------+--------+--------+--------+------------------+--------+ +----------+--------+-------+----------------+-------------------+           PSV cm/sEDV cmsDescribe        Arm Pressure (mmHG) +----------+--------+-------+----------------+-------------------+ [id number]             Multiphasic, [id number]                 +----------+--------+-------+----------------+-------------------+ +---------+--------+--+--------+-+---------+ VertebralPSV cm/s47EDV cm/s9Antegrade  +---------+--------+--+--------+-+---------+  Left Carotid Findings: +----------+--------+--------+--------+------------------+--------+           PSV cm/sEDV cm/sStenosisPlaque DescriptionComments +----------+--------+--------+--------+------------------+--------+ CCA Prox  44  11                                         +----------+--------+--------+--------+------------------+--------+ CCA Mid   44      11              heterogenous               +----------+--------+--------+--------+------------------+--------+ CCA Distal42      11              calcific                   +----------+--------+--------+--------+------------------+--------+ ICA Prox  191     54      40-59%  calcific                   +----------+--------+--------+--------+------------------+--------+ ICA Mid   139     25                                         +----------+--------+--------+--------+------------------+--------+ ICA Distal88      27                                         +----------+--------+--------+--------+------------------+--------+ ECA       54                                                 +----------+--------+--------+--------+------------------+--------+ +----------+--------+--------+----------------+-------------------+           PSV cm/sEDV cm/sDescribe        Arm Pressure (mmHG) +----------+--------+--------+----------------+-------------------+ [id number]              Multiphasic, [id number]                 +----------+--------+--------+----------------+-------------------+ +---------+--------+--+--------+--+---------+ VertebralPSV cm/s73EDV cm/s15Antegrade +---------+--------+--+--------+--+---------+   Summary: Right Carotid: Velocities in the right ICA are consistent with a 1-39% stenosis. Left Carotid: Velocities in the left ICA are consistent with a 40-59% stenosis. Vertebrals:  Bilateral vertebral arteries demonstrate antegrade flow.  Subclavians: Normal flow hemodynamics were seen in bilateral subclavian              arteries. *See table(s) above for measurements and observations.  Electronically signed by Orlie Pollen on 05/22/2022 at 5:44:02 PM.    Final    CT ANGIO CHEST AORTA W/CM & OR WO/CM  Result Date: 05/20/2022 CLINICAL DATA:  Follow-up aortic aneurysm. EXAM: CT ANGIOGRAPHY CHEST WITH CONTRAST TECHNIQUE: Multidetector CT imaging of the chest was performed using the standard protocol during bolus administration of intravenous contrast. Multiplanar CT image reconstructions and MIPs were obtained to evaluate the vascular anatomy. RADIATION DOSE REDUCTION: This exam was performed according to the departmental dose-optimization program which includes automated exposure control, adjustment of the mA and/or kV according to patient size and/or use of iterative reconstruction technique. CONTRAST:  66mL ISOVUE-370 IOPAMIDOL (ISOVUE-370) INJECTION 76% COMPARISON:  CT chest dated November 12, 2021. FINDINGS: Cardiovascular: Unchanged ascending thoracic aortic aneurysm measuring 5.2 cm in diameter, when measured at the same level and orientation. Coronary, aortic arch, and branch vessel atherosclerotic vascular disease.  Severe narrowing of the proximal left internal carotid artery just distal to the bifurcation due to atherosclerotic calcification (series 5, image 7). Normal heart size. No pericardial effusion. No pulmonary embolism. Mediastinum/Nodes: No enlarged mediastinal, hilar, or axillary lymph nodes. Thyroid gland, trachea, and esophagus demonstrate no significant findings. Lungs/Pleura: No focal consolidation, pleural effusion, or pneumothorax. Unchanged small calcified granulomas in the lower lobes. Upper Abdomen: No acute abnormality. Musculoskeletal: No chest wall abnormality. Elevated right hemidiaphragm. No acute or significant osseous findings. Multiple chronic thoracolumbar compression deformities. Review of the MIP images  confirms the above findings. IMPRESSION: 1. Unchanged 5.2 cm ascending thoracic aortic aneurysm. 2. Severe narrowing of the proximal left internal carotid artery just distal to the bifurcation due to atherosclerotic calcification. Consider further evaluation with carotid ultrasound. 3. Aortic Atherosclerosis (ICD10-I70.0). Electronically Signed   By: Obie Dredge M.D.   On: 05/20/2022 11:28     Subjective: No acute issues or events overnight, family at bedside states patient appears to be back to baseline otherwise stable and agreeable for discharge home.   Discharge Exam: Vitals:   06/13/22 1946 06/14/22 0620  BP: 124/73 106/74  Pulse: 91 88  Resp: 15 15  Temp: 99.2 F (37.3 C) 98.2 F (36.8 C)  SpO2: 99% 95%   Vitals:   06/13/22 0419 06/13/22 0821 06/13/22 1948 06/14/22 0620  BP: (!) 113/55  124/73 106/74  Pulse: 66 80 91 88  Resp: 17  15 15   Temp: 98.5 F (36.9 C) 98 F (36.7 C) 99.2 F (37.3 C) 98.2 F (36.8 C)  TempSrc: Oral Oral  Oral  SpO2: 100% 99% 99% 95%  Weight:      Height:        General: Pt is alert, awake, not in acute distress Cardiovascular: RRR, S1/S2 +, no rubs, no gallops Respiratory: CTA bilaterally, no wheezing, no rhonchi Abdominal: Soft, NT, ND, bowel sounds + Extremities: no edema, no cyanosis    The results of significant diagnostics from this hospitalization (including imaging, microbiology, ancillary and laboratory) are listed below for reference.     Microbiology: Recent Results (from the past 240 hour(s))  Blood Culture (routine x 2)     Status: None (Preliminary result)   Collection Time: 06/12/22  5:37 AM   Specimen: BLOOD  Result Value Ref Range Status   Specimen Description BLOOD SITE NOT SPECIFIED  Final   Special Requests   Final    BOTTLES DRAWN AEROBIC AND ANAEROBIC Blood Culture adequate volume   Culture   Final    NO GROWTH 2 DAYS Performed at [facility], [address], [place], [place]    Report  Status PENDING  Incomplete  Blood Culture (routine x 2)     Status: None (Preliminary result)   Collection Time: 06/12/22  5:38 AM   Specimen: BLOOD LEFT FOREARM  Result Value Ref Range Status   Specimen Description BLOOD LEFT FOREARM  Final   Special Requests   Final    BOTTLES DRAWN AEROBIC AND ANAEROBIC Blood Culture results may not be optimal due to an excessive volume of blood received in culture bottles   Culture   Final    NO GROWTH 2 DAYS Performed at [facility], [address], [place], [place]    Report Status PENDING  Incomplete  Urine Culture     Status: None   Collection Time: 06/12/22  6:01 AM   Specimen: In/Out Cath Urine  Result Value Ref Range Status  Specimen Description IN/OUT CATH URINE  Final   Special Requests NONE  Final   Culture   Final    NO GROWTH Performed at [facility], [address] [place], [place] [address]    Report Status 06/13/2022 FINAL  Final  SARS Coronavirus 2 by RT PCR (hospital order, performed in [place] hospital lab) *cepheid single result test* Anterior Nasal Swab     Status: None   Collection Time: 06/12/22  7:33 AM   Specimen: Anterior Nasal Swab  Result Value Ref Range Status   SARS Coronavirus 2 by RT PCR NEGATIVE NEGATIVE Final    Comment: (NOTE) SARS-CoV-2 target nucleic acids are NOT DETECTED.  The SARS-CoV-2 RNA is generally detectable in upper and lower respiratory specimens during the acute phase of infection. The lowest concentration of SARS-CoV-2 viral copies this assay can detect is 250 copies / mL. A negative result does not preclude SARS-CoV-2 infection and should not be used as the sole basis for treatment or other patient management decisions.  A negative result may occur with improper specimen collection / handling, submission of specimen other than nasopharyngeal swab, presence of viral mutation(s) within the areas targeted by this assay, and inadequate number of viral copies (<250  copies / mL). A negative result must be combined with clinical observations, patient history, and epidemiological information.  Fact Sheet for Patients:   RoadLapTop.co.za  Fact Sheet for Healthcare Providers: http://kim-miller.com/  This test is not yet approved or  cleared by the [place] FDA and has been authorized for detection and/or diagnosis of SARS-CoV-2 by FDA under an Emergency Use Authorization (EUA).  This EUA will remain in effect (meaning this test can be used) for the duration of the COVID-19 declaration under Section 564(b)(1) of the Act, 21 U.S.C. section 360bbb-3(b)(1), unless the authorization is terminated or revoked sooner.  Performed at [facility], [address], [place], [place] [address]      Labs: BNP (last 3 results) Recent Labs    11/13/21 2002  BNP 332.1*   Basic Metabolic Panel: Recent Labs  Lab 06/12/22 0537 06/13/22 0040  NA 140 141  K 4.0 3.6  CL 106 111  CO2 24 18*  GLUCOSE 97 108*  BUN 18 13  CREATININE 0.80 0.69  CALCIUM 10.0 9.1   Liver Function Tests: Recent Labs  Lab 06/12/22 0537  AST 25  ALT 18  ALKPHOS 71  BILITOT 0.5  PROT 7.5  ALBUMIN 3.7   No results for input(s): "LIPASE", "AMYLASE" in the last 168 hours. No results for input(s): "AMMONIA" in the last 168 hours. CBC: Recent Labs  Lab 06/12/22 0537 06/13/22 0040  WBC 17.0* 11.7*  NEUTROABS 15.1*  --   HGB 12.4 10.2*  HCT 38.6 31.0*  MCV 92.3 90.4  PLT 321 292   Cardiac Enzymes: No results for input(s): "CKTOTAL", "CKMB", "CKMBINDEX", "TROPONINI" in the last 168 hours. BNP: Invalid input(s): "POCBNP" CBG: No results for input(s): "GLUCAP" in the last 168 hours. D-Dimer No results for input(s): "DDIMER" in the last 72 hours. Hgb A1c No results for input(s): "HGBA1C" in the last 72 hours. Lipid Profile No results for input(s): "CHOL", "HDL", "LDLCALC", "TRIG", "CHOLHDL", "LDLDIRECT" in the  last 72 hours. Thyroid function studies No results for input(s): "TSH", "T4TOTAL", "T3FREE", "THYROIDAB" in the last 72 hours.  Invalid input(s): "FREET3" Anemia work up No results for input(s): "VITAMINB12", "FOLATE", "FERRITIN", "TIBC", "IRON", "RETICCTPCT" in the last 72 hours. Urinalysis    Component  Value Date/Time   COLORURINE YELLOW 06/12/2022 0600   APPEARANCEUR CLEAR 06/12/2022 0600   LABSPEC 1.010 06/12/2022 0600   PHURINE 5.0 06/12/2022 0600   GLUCOSEU NEGATIVE 06/12/2022 0600   HGBUR NEGATIVE 06/12/2022 0600   BILIRUBINUR NEGATIVE 06/12/2022 0600   KETONESUR NEGATIVE 06/12/2022 0600   PROTEINUR NEGATIVE 06/12/2022 0600   NITRITE NEGATIVE 06/12/2022 0600   LEUKOCYTESUR NEGATIVE 06/12/2022 0600   Sepsis Labs Recent Labs  Lab 06/12/22 0537 06/13/22 0040  WBC 17.0* 11.7*   Microbiology Recent Results (from the past 240 hour(s))  Blood Culture (routine x 2)     Status: None (Preliminary result)   Collection Time: 06/12/22  5:37 AM   Specimen: BLOOD  Result Value Ref Range Status   Specimen Description BLOOD SITE NOT SPECIFIED  Final   Special Requests   Final    BOTTLES DRAWN AEROBIC AND ANAEROBIC Blood Culture adequate volume   Culture   Final    NO GROWTH 2 DAYS Performed at [facility], [address] [place], [place] [address]    Report Status PENDING  Incomplete  Blood Culture (routine x 2)     Status: None (Preliminary result)   Collection Time: 06/12/22  5:38 AM   Specimen: BLOOD LEFT FOREARM  Result Value Ref Range Status   Specimen Description BLOOD LEFT FOREARM  Final   Special Requests   Final    BOTTLES DRAWN AEROBIC AND ANAEROBIC Blood Culture results may not be optimal due to an excessive volume of blood received in culture bottles   Culture   Final    NO GROWTH 2 DAYS Performed at [facility], [address] [place], [place] [address]    Report Status PENDING  Incomplete  Urine Culture     Status: None   Collection Time:  06/12/22  6:01 AM   Specimen: In/Out Cath Urine  Result Value Ref Range Status   Specimen Description IN/OUT CATH URINE  Final   Special Requests NONE  Final   Culture   Final    NO GROWTH Performed at [facility], [address] [place], [place] [address]    Report Status 06/13/2022 FINAL  Final  SARS Coronavirus 2 by RT PCR (hospital order, performed in [facility] hospital lab) *cepheid single result test* Anterior Nasal Swab     Status: None   Collection Time: 06/12/22  7:33 AM   Specimen: Anterior Nasal Swab  Result Value Ref Range Status   SARS Coronavirus 2 by RT PCR NEGATIVE NEGATIVE Final    Comment: (NOTE) SARS-CoV-2 target nucleic acids are NOT DETECTED.  The SARS-CoV-2 RNA is generally detectable in upper and lower respiratory specimens during the acute phase of infection. The lowest concentration of SARS-CoV-2 viral copies this assay can detect is 250 copies / mL. A negative result does not preclude SARS-CoV-2 infection and should not be used as the sole basis for treatment or other patient management decisions.  A negative result may occur with improper specimen collection / handling, submission of specimen other than nasopharyngeal swab, presence of viral mutation(s) within the areas targeted by this assay, and inadequate number of viral copies (<250 copies / mL). A negative result must be combined with clinical observations, patient history, and epidemiological information.  Fact Sheet for Patients:   RoadLapTop.co.zahttps://www.fda.gov/media/158405/download  Fact Sheet for Healthcare Providers: http://kim-miller.com/https://www.fda.gov/media/158404/download  This test is not yet approved or  cleared by the [place] FDA and has been authorized for detection and/or diagnosis  of SARS-CoV-2 by FDA under an Emergency Use Authorization (EUA).  This EUA will remain in effect (meaning this test can be used) for the duration of the COVID-19 declaration under Section 564(b)(1) of the Act, 21  U.S.C. section 360bbb-3(b)(1), unless the authorization is terminated or revoked sooner.  Performed at [facility], [address], [place], [place] [address]      Time coordinating discharge: Over 30 minutes  SIGNED:   Azucena Fallen, DO [facility] 06/14/2022, 1:08 PM Pager   If 7PM-7AM, please contact night-coverage [email or web address]

## 2022-06-17 LAB — CULTURE, BLOOD (ROUTINE X 2)
Culture: NO GROWTH
Culture: NO GROWTH
Special Requests: ADEQUATE

## 2022-06-25 ENCOUNTER — Ambulatory Visit (INDEPENDENT_AMBULATORY_CARE_PROVIDER_SITE_OTHER): Payer: Medicare Other | Admitting: Physician Assistant

## 2022-06-25 VITALS — BP 120/65 | HR 69 | Resp 20 | Ht 67.0 in | Wt 128.0 lb

## 2022-06-25 DIAGNOSIS — G3101 Pick's disease: Secondary | ICD-10-CM | POA: Diagnosis not present

## 2022-06-25 DIAGNOSIS — F0280 Dementia in other diseases classified elsewhere without behavioral disturbance: Secondary | ICD-10-CM | POA: Diagnosis not present

## 2022-06-25 DIAGNOSIS — F01B0 Vascular dementia, moderate, without behavioral disturbance, psychotic disturbance, mood disturbance, and anxiety: Secondary | ICD-10-CM

## 2022-06-25 DIAGNOSIS — I6523 Occlusion and stenosis of bilateral carotid arteries: Secondary | ICD-10-CM

## 2022-06-25 MED ORDER — MEMANTINE HCL 10 MG PO TABS: 10.0000 mg | ORAL_TABLET | Freq: Two times a day (BID) | ORAL | 11 refills | Status: DC

## 2022-06-25 NOTE — Patient Instructions (Addendum)
It was a pleasure to see you today at our office.   Recommendations:  Continue memantine 10 mg  two times a day   Home Speech therapy  Follow up in 6 months  Whom to call:  Memory  decline, memory medications: Call our office [phone]   For psychiatric meds, mood meds: Please have your primary care physician manage these medications.   Counseling regarding caregiver distress, including caregiver depression, anxiety and issues regarding community resources, adult day care programs, adult living facilities, or memory care questions:   Feel free to contact Misty Lisabeth Register, Social Worker at [phone]   For assessment of decision of mental capacity and competency:  Call Dr. Erick Blinks, geriatric psychiatrist at [phone]  For guidance in geriatric dementia issues please call [facility] [phone]  For guidance regarding [facility] and if placement were needed at the facility, contact Sidney Ace, Social Worker tel: [phone]  If you have any severe symptoms of a stroke, or other severe issues such as confusion,severe chills or fever, etc call 911 or go to the ER as you may need to be evaluated further   Feel free to visit Facebook page " [employer]" for tips of how to care for people with memory problems.   Feel free to go to the following database for funded clinical studies conducted around the world: RankChecks.se   https://www.triadclinicaltrials.com/     RECOMMENDATIONS FOR ALL PATIENTS WITH MEMORY PROBLEMS: 1. Continue to exercise (Recommend 30 minutes of walking everyday, or 3 hours every week) 2. Increase social interactions - continue going to [place] and enjoy social gatherings with friends and family 3. Eat healthy, avoid fried foods and eat more fruits and vegetables 4. Maintain adequate blood pressure, blood sugar, and blood cholesterol level. Reducing the risk of stroke and cardiovascular disease also  helps promoting better memory. 5. Avoid stressful situations. Live a simple life and avoid aggravations. Organize your time and prepare for the next day in anticipation. 6. Sleep well, avoid any interruptions of sleep and avoid any distractions in the bedroom that may interfere with adequate sleep quality 7. Avoid sugar, avoid sweets as there is a strong link between excessive sugar intake, diabetes, and cognitive impairment We discussed the Mediterranean diet, which has been shown to help patients reduce the risk of progressive memory disorders and reduces cardiovascular risk. This includes eating fish, eat fruits and green leafy vegetables, nuts like almonds and hazelnuts, walnuts, and also use olive oil. Avoid fast foods and fried foods as much as possible. Avoid sweets and sugar as sugar use has been linked to worsening of memory function.  There is always a concern of gradual progression of memory problems. If this is the case, then we may need to adjust level of care according to patient needs. Support, both to the patient and caregiver, should then be put into place.      You have been referred for a neuropsychological evaluation (i.e., evaluation of memory and thinking abilities). Please bring someone with you to this appointment if possible, as it is helpful for the doctor to hear from both you and another adult who knows you well. Please bring eyeglasses and hearing aids if you wear them.    The evaluation will take approximately 3 hours and has two parts:   The first part is a clinical interview with the neuropsychologist (Dr. Milbert Coulter or Dr. Roseanne Reno). During the interview, the neuropsychologist will speak with you and the individual you brought to  the appointment.    The second part of the evaluation is testing with the doctor's technician Hinton Dyer or Maudie Mercury). During the testing, the technician will ask you to remember different types of material, solve problems, and answer some questionnaires.  Your family member will not be present for this portion of the evaluation.   Please note: We must reserve several hours of the neuropsychologist's time and the psychometrician's time for your evaluation appointment. As such, there is a No-Show fee of $100. If you are unable to attend any of your appointments, please contact our office as soon as possible to reschedule.    FALL PRECAUTIONS: Be cautious when walking. Scan the area for obstacles that may increase the risk of trips and falls. When getting up in the mornings, sit up at the edge of the bed for a few minutes before getting out of bed. Consider elevating the bed at the head end to avoid drop of blood pressure when getting up. Walk always in a well-lit room (use night lights in the walls). Avoid area rugs or power cords from appliances in the middle of the walkways. Use a walker or a cane if necessary and consider physical therapy for balance exercise. Get your eyesight checked regularly.  FINANCIAL OVERSIGHT: Supervision, especially oversight when making financial decisions or transactions is also recommended.  HOME SAFETY: Consider the safety of the kitchen when operating appliances like stoves, microwave oven, and blender. Consider having supervision and share cooking responsibilities until no longer able to participate in those. Accidents with firearms and other hazards in the house should be identified and addressed as well.   ABILITY TO BE LEFT ALONE: If patient is unable to contact 911 operator, consider using LifeLine, or when the need is there, arrange for someone to stay with patients. Smoking is a fire hazard, consider supervision or cessation. Risk of wandering should be assessed by caregiver and if detected at any point, supervision and safe proof recommendations should be instituted.  MEDICATION SUPERVISION: Inability to self-administer medication needs to be constantly addressed. Implement a mechanism to ensure safe  administration of the medications.   DRIVING: Regarding driving, in patients with progressive memory problems, driving will be impaired. We advise to have someone else do the driving if trouble finding directions or if minor accidents are reported. Independent driving assessment is available to determine safety of driving.   If you are interested in the driving assessment, you can contact the following:  [employer] in [place]  [place] [phone] or [phone]    [place] refers to food and lifestyle choices that are based on the traditions of countries located on the [employer]. This way of eating has been shown to help prevent certain conditions and improve outcomes for people who have chronic diseases, like kidney disease and heart disease. What are tips for following this plan? Lifestyle  Cook and eat meals together with your family, when possible. Drink enough fluid to keep your urine clear or pale yellow. Be physically active every day. This includes: Aerobic exercise like running or swimming. Leisure activities like gardening, walking, or housework. Get 7-8 hours of sleep each night. If recommended by your health care provider, drink red wine in moderation. This means 1 glass a day for nonpregnant women and 2 glasses a day for men. A glass of wine equals 5 oz (150 mL). Reading food labels  Check the serving  size of packaged foods. For foods such as rice and pasta, the serving size refers to the amount of cooked product, not dry. Check the total fat in packaged foods. Avoid foods that have saturated fat or trans fats. Check the ingredients list for added sugars, such as corn syrup. Shopping  At the grocery store, buy most of your food from the areas near the walls of the store. This includes: Fresh fruits and  vegetables (produce). Grains, beans, nuts, and seeds. Some of these may be available in unpackaged forms or large amounts (in bulk). Fresh seafood. Poultry and eggs. Low-fat dairy products. Buy whole ingredients instead of prepackaged foods. Buy fresh fruits and vegetables in-season from local farmers markets. Buy frozen fruits and vegetables in resealable bags. If you do not have access to quality fresh seafood, buy precooked frozen shrimp or canned fish, such as tuna, salmon, or sardines. Buy small amounts of raw or cooked vegetables, salads, or olives from the deli or salad bar at your store. Stock your pantry so you always have certain foods on hand, such as olive oil, canned tuna, canned tomatoes, rice, pasta, and beans. Cooking  Cook foods with extra-virgin olive oil instead of using butter or other vegetable oils. Have meat as a side dish, and have vegetables or grains as your main dish. This means having meat in small portions or adding small amounts of meat to foods like pasta or stew. Use beans or vegetables instead of meat in common dishes like chili or lasagna. Experiment with different cooking methods. Try roasting or broiling vegetables instead of steaming or sauteing them. Add frozen vegetables to soups, stews, pasta, or rice. Add nuts or seeds for added healthy fat at each meal. You can add these to yogurt, salads, or vegetable dishes. Marinate fish or vegetables using olive oil, lemon juice, garlic, and fresh herbs. Meal planning  Plan to eat 1 vegetarian meal one day each week. Try to work up to 2 vegetarian meals, if possible. Eat seafood 2 or more times a week. Have healthy snacks readily available, such as: Vegetable sticks with hummus. Greek yogurt. Fruit and nut trail mix. Eat balanced meals throughout the week. This includes: Fruit: 2-3 servings a day Vegetables: 4-5 servings a day Low-fat dairy: 2 servings a day Fish, poultry, or lean meat: 1 serving a  day Beans and legumes: 2 or more servings a week Nuts and seeds: 1-2 servings a day Whole grains: 6-8 servings a day Extra-virgin olive oil: 3-4 servings a day Limit red meat and sweets to only a few servings a month What are my food choices? Mediterranean diet Recommended Grains: Whole-grain pasta. Brown rice. Bulgar wheat. Polenta. Couscous. Whole-wheat bread. Orpah Cobb. Vegetables: Artichokes. Beets. Broccoli. Cabbage. Carrots. Eggplant. Green beans. Chard. Kale. Spinach. Onions. Leeks. Peas. Squash. Tomatoes. Peppers. Radishes. Fruits: Apples. Apricots. Avocado. Berries. Bananas. Cherries. Dates. Figs. Grapes. Lemons. Melon. Oranges. Peaches. Plums. Pomegranate. Meats and other protein foods: Beans. Almonds. Sunflower seeds. Pine nuts. Peanuts. Cod. Salmon. Scallops. Shrimp. Tuna. Tilapia. Clams. Oysters. Eggs. Dairy: Low-fat milk. Cheese. Greek yogurt. Beverages: Water. Red wine. Herbal tea. Fats and oils: Extra virgin olive oil. Avocado oil. Grape seed oil. Sweets and desserts: [place] yogurt with honey. Baked apples. Poached pears. Trail mix. Seasoning and other foods: Basil. Cilantro. Coriander. Cumin. Mint. Parsley. Sage. Rosemary. Tarragon. Garlic. Oregano. Thyme. Pepper. Balsalmic vinegar. Tahini. Hummus. Tomato sauce. Olives. Mushrooms. Limit these Grains: Prepackaged pasta or rice dishes. Prepackaged cereal with added sugar. Vegetables: Deep fried potatoes (french  fries). Fruits: Fruit canned in syrup. Meats and other protein foods: Beef. Pork. Lamb. Poultry with skin. Hot dogs. Tomasa Blase. Dairy: Ice cream. Sour cream. Whole milk. Beverages: Juice. Sugar-sweetened soft drinks. Beer. Liquor and spirits. Fats and oils: Butter. [place] oil. Vegetable oil. Beef fat (tallow). Lard. Sweets and desserts: Cookies. Cakes. Pies. Candy. Seasoning and other foods: Mayonnaise. Premade sauces and marinades. The items listed may not be a complete list. Talk with your dietitian about what  dietary choices are right for you. Summary The Mediterranean diet includes both food and lifestyle choices. Eat a variety of fresh fruits and vegetables, beans, nuts, seeds, and whole grains. Limit the amount of red meat and sweets that you eat. Talk with your health care provider about whether it is safe for you to drink red wine in moderation. This means 1 glass a day for nonpregnant women and 2 glasses a day for men. A glass of wine equals 5 oz (150 mL). This information is not intended to replace advice given to you by your health care provider. Make sure you discuss any questions you have with your health care provider. Document Released: 04/30/2016 Document Revised: 06/02/2016 Document Reviewed: 04/30/2016 Elsevier Interactive Patient Education  2017 [employer].   We have sent a referral to [facility] for your MRI and they will call you directly to schedule your appointment. They are located at [address] [facility]. If you need to contact them directly please call [phone].

## 2022-06-25 NOTE — Progress Notes (Signed)
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 RH femalef with  a history of hypertension, hyperlipidemia, QT prolongation, history of traumatic hematoma of the head  seen today in follow up for memory loss. Patient is currently on memantine 10 mg bid, tolerating well. MRI brain has not been performed to date.  MoCA on 02/2022 was 3/30, likely exacerbated by her aphasia.  Patient is doing speech therapy, and has benefited from it, although needs renewal of the order.  Overall, she is stable from the cognitive standpoint .  Recommendations   Follow up in  6 months. Continue speech therapy for PPA, will renew the order Continue Memantine 10 mg twice daily. Side effects were discussed  Recommend good control of cardiovascular risk factors.        Subjective:    This patient is accompanied in the office by her daughter and her caretaker who supplements the history.  Previous records as well as any outside records available were reviewed prior to todays visit.    Any changes in memory since last visit?  "Memory is better the medicine is helping "-she says, daughter reports that she remembers what she did the day before is here, she also remembers appointments without significant difficulty.  She feels that speech therapy has helped with expression of words.  She has learned to "slow down, and use simpler words than before, and she is also able to use more pictures ".  Long-term memory is good. Repeats oneself?  Denies  Disoriented when walking into a room?  Patient denies   Leaving objects in unusual places?  Patient denies   Ambulates  with difficulty?    Patient uses a walker to ambulate Recent falls?  Patient denies   Any head injuries?  Patient denies   History of seizures?   Patient denies   Wandering behavior?  Patient denies   Patient drives?   Patient no longer drives  Any mood changes since last visit?   Sometimes she may get irritated but most of the time she is "happy go lucky"    Any worsening depression?:  Patient denies   Hallucinations?  Patient denies   Paranoia?  Patient denies   Patient reports that sleeps well without vivid dreams, REM behavior or sleepwalking   History of sleep apnea?  Patient denies   Any hygiene concerns?   She does not remember that she has to use toothpaste or soap, but she is able to take a shower frequently without any issues. Independent of bathing and dressing?  Endorsed but caregiver assists her   Does the patient needs help with medications?  Caregiver provides, she swallows her medicines better with yogurt.  Who is in charge of the finances? Daughter  is in charge    Any changes in appetite?  Patient denies   Does the patient cook?  Patient denies   Any headaches?  Patient denies   Double vision? She is to have cataract surgery  in December  Any focal numbness or tingling?  Patient denies   Chronic back pain Patient denies   Unilateral weakness?  Patient denies   Any tremors?  Patient denies   Any history of anosmia?  Patient denies   Any incontinence of urine?  Endorsed. Had a hospitalization for Sepsis likely due to UTI on 05/2022 . She wears briefs  "never had an accident" Any bowel dysfunction?  Occasional constipation so  she takes stool softeners and prunes  Patient lives with: Daughter and caregiver  Initial visit 03/03/22  How long did patient have memory difficulties? 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  Then she began having difficulties with short-term memory, and for the last year, she has demonstrated difficulties with speech, "knowing what I want to say, but it does not come out of my mouth ".  She continues to enjoy reading, watching TV and music. Patient lives with: sister and has an aide coming for 8 hrs repeats oneself? Denies   Disoriented when walking into a room?  Patient denies except "always getting confused when trying to find a bathroom " leaving objects in unusual places?  Patient denies   Ambulates  with difficulty?   Patient denies, but uses a walker for stability.   "Walks a lot" Recent falls?  Patient denies   Any head injuries?  She has a history of concussion of the forehead on 11/13/2021, and on 07/22/2020 after a fall after UTI and Requiring hospitalization history of seizures?   Patient denies   Wandering behavior?  Patient denies   Patient drives?   Patient no longer drives. Stopped after the head injury  Any mood changes such irritability agitation?  Patient denies  "She is sweet" Any history of depression?:  Patient denies   Hallucinations?  Patient denies   Paranoia?  Patient denies   Patient reports that he sleeps well without vivid dreams, REM behavior or sleepwalking    History of sleep apnea?  Patient denies   Any hygiene concerns?  Patient denies   Independent of bathing and dressing?  Endorsed  Does the patient needs help with medications?  Caregiver in charge Who is in charge of the finances? Sister Colleen Parker is in charge  Any changes in appetite?  Patient denies   Patient have trouble swallowing? Patient denies   Does the patient cook?  Patient cooks occasionally  Any kitchen accidents such as leaving the stove on? Patient denies   Any headaches?  Patient denies   The double vision? Patient denies   Any focal numbness or tingling?  Patient denies   Chronic back pain Patient denies   Unilateral weakness?  Patient denies   Any tremors?  Patient denies   Any history of anosmia?  Patient denies   Any incontinence of urine?  "Only at night may need a diaper ".  She had a recent UTI and requiring hospitalization in February 2023 Any bowel dysfunction?   Patient denies   History of heavy alcohol intake?  Patient denies   History of heavy tobacco use?  Patient denies   Family history of  dementia?  Strong family history of Alzheimer's disease , Mo, maternal aunt, maternal first cousin Engineer, maintenance (IT) (valedictorian), retired Airline pilot    PREVIOUS MEDICATIONS:   CURRENT MEDICATIONS:  Outpatient Encounter Medications as of 06/25/2022  Medication Sig   acetaminophen (TYLENOL) 500 MG tablet Take 1,000 mg by mouth as needed for mild pain or headache.   albuterol (VENTOLIN HFA) 108 (90 Base) MCG/ACT inhaler Inhale 2 puffs into the lungs 3 (three) times daily as needed for wheezing or shortness of breath.   alendronate (FOSAMAX) 70 MG tablet Take 70 mg by mouth once a week.   aspirin EC 81 MG tablet Take 81 mg by mouth daily. Swallow whole.   AZO-CRANBERRY PO Take 1 tablet by  mouth in the morning and at bedtime.   cholecalciferol (VITAMIN D3) 25 MCG (1000 UNIT) tablet Take 1,000 Units by mouth daily.   loratadine (CLARITIN) 10 MG tablet Take 10 mg by mouth daily as needed for allergies.   meloxicam (MOBIC) 15 MG tablet Take 15 mg by mouth as needed for pain.   Probiotic Product (CULTURELLE PROBIOTICS) CHEW Chew 1 tablet by mouth daily.   rosuvastatin (CRESTOR) 10 MG tablet Take 1 tablet (10 mg total) by mouth daily.   sennosides-docusate sodium (SENOKOT-S) 8.6-50 MG tablet Take 1 tablet by mouth daily.   [DISCONTINUED] memantine (NAMENDA) 10 MG tablet Take 1 tablet (10 mg at night) for 2 weeks, then increase to 1 tablet (10 mg) twice a day (Patient taking differently: Take 10 mg by mouth 2 (two) times daily.)   memantine (NAMENDA) 10 MG tablet Take 1 tablet (10 mg total) by mouth 2 (two) times daily.   No facility-administered encounter medications on file as of 06/25/2022.        No data to display            02/27/2022    9:00 AM  Montreal Cognitive Assessment   Visuospatial/ Executive (0/5) 0  Naming (0/3) 1  Attention: Read list of digits (0/2) 2  Attention: Read list of letters (0/1) 0  Attention: Serial 7 subtraction starting at 100 (0/3) 0  Language: Repeat  phrase (0/2) 0  Language : Fluency (0/1) 0  Abstraction (0/2) 0  Delayed Recall (0/5) 0  Orientation (0/6) 0  Total 3  Adjusted Score (based on education) 3    Objective:     PHYSICAL EXAMINATION:    VITALS:   Vitals:   06/25/22 1258  BP: 120/65  Pulse: 69  Resp: 20  SpO2: 100%  Weight: 128 lb (58.1 kg)  Height: 5\' 7"  (1.702 m)    GEN:  The patient appears stated age and is in NAD. HEENT:  Normocephalic, atraumatic.   Neurological examination:  General: NAD, well-groomed, appears stated age. Orientation: The patient is alert. Oriented to person, place, not to time Cranial nerves: There is good facial symmetry.The speech is fluent and clear. +aphasia, no dysarthria. Fund of knowledge is appropriate. Recent and remote memory are impaired. Attention and concentration are reduced.  Able to name objects and repeat phrases.  Hearing is intact to conversational tone.    Sensation: Sensation is intact to light touch throughout Motor: Strength is at least antigravity x4. Tremors: none  DTR's 2/4 in UE/LE     Movement examination: Tone: There is normal tone in the UE/LE Abnormal movements:  no tremor.  No myoclonus.  No asterixis.   Coordination:  There is no decremation with RAM's. Normal finger to nose  Gait and Station: The patient has  difficulty arising out of a deep-seated chair without the use of the hands, she favors using her walker for stability. The patient's stride length is good.  Gait is cautious and narrow.    Thank you for allowing the opportunity to participate in the care of this nice patient. Please do not hesitate to contact us for any questions or concerns.   Total time spent on today's visit was 32 minutes dedicated to this patient today, preparing to see patient, examining the patient, ordering tests and/or medications and counseling the patient, documenting clinical information in the EHR or other health record, independently interpreting results and  communicating results to the patient/family, discussing treatment and goals, answering patient's questions and coordinating  care.  Cc:  Lula Olszewski, MD  Marlowe Kays 06/25/2022 3:11 PM

## 2022-07-16 ENCOUNTER — Telehealth: Payer: Self-pay | Admitting: Cardiology

## 2022-07-16 NOTE — Telephone Encounter (Signed)
Attempted to return call to patients sister- unable to reach. Left message to call back.   No lab orders in system- need to clarify if labs were to come from cardiology or PCP.

## 2022-07-16 NOTE — Telephone Encounter (Signed)
Patient sister called stating they have a nurse that comes to the house.  The nurse said she would do the lab work if we fax over the lab order to her at [phone].

## 2022-07-16 NOTE — Telephone Encounter (Signed)
Pt is returning call. Requesting call back.  

## 2022-07-21 ENCOUNTER — Encounter: Payer: Medicare Other | Admitting: Psychology

## 2022-07-23 ENCOUNTER — Other Ambulatory Visit: Payer: Self-pay

## 2022-07-23 ENCOUNTER — Telehealth: Payer: Self-pay | Admitting: Cardiology

## 2022-07-23 NOTE — Telephone Encounter (Signed)
New Messages:      She needs order for  patient's lab work please. Please fax to [phone].

## 2022-07-23 NOTE — Telephone Encounter (Signed)
Spoke with Judeen Hammans from [place]. She asked for the 9-month lab orders. Informed her there are no current lab orders to check with patient's PCP.

## 2022-07-28 ENCOUNTER — Encounter: Payer: Medicare Other | Admitting: Psychology

## 2022-07-28 ENCOUNTER — Ambulatory Visit (INDEPENDENT_AMBULATORY_CARE_PROVIDER_SITE_OTHER): Payer: Medicare Other | Admitting: Podiatry

## 2022-07-28 ENCOUNTER — Encounter: Payer: Self-pay | Admitting: Podiatry

## 2022-07-28 DIAGNOSIS — M79675 Pain in left toe(s): Secondary | ICD-10-CM

## 2022-07-28 DIAGNOSIS — M79674 Pain in right toe(s): Secondary | ICD-10-CM

## 2022-07-28 DIAGNOSIS — F01C0 Vascular dementia, severe, without behavioral disturbance, psychotic disturbance, mood disturbance, and anxiety: Secondary | ICD-10-CM

## 2022-07-28 DIAGNOSIS — B351 Tinea unguium: Secondary | ICD-10-CM

## 2022-07-28 NOTE — Progress Notes (Signed)
This patient returns to the office for evaluation and treatment of long thick painful nails .  This patient is unable to trim her own nails since the patient cannot reach her feet.  Patient says the nails are painful walking and wearing his shoes.  She returns for preventive foot care services.  She presents with female caregiver.  General Appearance  Alert, conversant and in no acute stress.  Vascular  Dorsalis pedis and posterior tibial  pulses are  weakly palpable  bilaterally.  Capillary return is within normal limits  bilaterally. Cold feet  bilaterally.  Neurologic  Senn-Weinstein monofilament wire test within normal limits  bilaterally. Muscle power within normal limits bilaterally.  Nails Thick disfigured discolored nails with subungual debris  from hallux to fifth toes bilaterally. No evidence of bacterial infection or drainage bilaterally.  Orthopedic  No limitations of motion  feet .  No crepitus or effusions noted.  HAV  B/L.  Hammet toes  B/L.  Skin  normotropic skin with no porokeratosis noted bilaterally.  No signs of infections or ulcers noted.     Onychomycosis  Pain in toes right foot  Pain in toes left foot  Debridement  of nails  1-5  B/L with a nail nipper.  Nails were then filed using a dremel tool with no incidents.   Both hallux nail plates are loosely attached. RTC 3 months    Gardiner Barefoot DPM

## 2022-09-03 ENCOUNTER — Telehealth: Payer: Self-pay | Admitting: Cardiology

## 2022-09-03 NOTE — Telephone Encounter (Signed)
Patient called to let Dr. Jens Som know that the patient has dementia. Please call patient back

## 2022-09-03 NOTE — Telephone Encounter (Addendum)
Spoke with patient's sister. She has been unable to get her to the lab for a fasting lipid panel b/c patient does not do well in the AM. She would like to know if home health can do the lab draw  Home Health Fax: [phone]  [facility] - Cordelia Pen [phone]  Will send to MD/RN

## 2022-09-04 NOTE — Telephone Encounter (Signed)
Spoke to sister-advised would fax lab order to [facility] to be complete.

## 2022-09-04 NOTE — Telephone Encounter (Signed)
Yes can we give an order for home health to draw labs?

## 2022-09-11 ENCOUNTER — Ambulatory Visit: Payer: Medicare Other | Attending: Cardiology | Admitting: Cardiology

## 2022-09-11 ENCOUNTER — Encounter: Payer: Self-pay | Admitting: Cardiology

## 2022-09-11 VITALS — BP 136/79 | HR 113 | Ht 65.0 in | Wt 132.6 lb

## 2022-09-11 DIAGNOSIS — I712 Thoracic aortic aneurysm, without rupture, unspecified: Secondary | ICD-10-CM | POA: Diagnosis not present

## 2022-09-11 DIAGNOSIS — E785 Hyperlipidemia, unspecified: Secondary | ICD-10-CM

## 2022-09-11 DIAGNOSIS — I2510 Atherosclerotic heart disease of native coronary artery without angina pectoris: Secondary | ICD-10-CM

## 2022-09-11 DIAGNOSIS — I6523 Occlusion and stenosis of bilateral carotid arteries: Secondary | ICD-10-CM | POA: Diagnosis not present

## 2022-09-11 NOTE — Patient Instructions (Signed)
Medication Instructions:  Your physician recommends that you continue on your current medications as directed. Please refer to the Current Medication list given to you today.  *If you need a refill on your cardiac medications before your next appointment, please call your pharmacy*   Lab Work: Lipid today  If you have labs (blood work) drawn today and your tests are completely normal, you will receive your results only by: MyChart Message (if you have MyChart) OR A paper copy in the mail If you have any lab test that is abnormal or we need to change your treatment, we will call you to review the results.   Follow-Up: At [facility], you and your health needs are our priority.  As part of our continuing mission to provide you with exceptional heart care, we have created designated Provider Care Teams.  These Care Teams include your primary Cardiologist (physician) and Advanced Practice Providers (APPs -  Physician Assistants and Nurse Practitioners) who all work together to provide you with the care you need, when you need it.  We recommend signing up for the patient portal called "MyChart".  Sign up information is provided on this After Visit Summary.  MyChart is used to connect with patients for Virtual Visits (Telemedicine).  Patients are able to view lab/test results, encounter notes, upcoming appointments, etc.  Non-urgent messages can be sent to your provider as well.   To learn more about what you can do with MyChart, go to [email or web address].    Your next appointment:   6 month(s)  The format for your next appointment:   In Person  Provider:   Little Ishikawa, MD

## 2022-09-11 NOTE — Progress Notes (Signed)
Cardiology Office Note:    Date:  09/11/2022   ID:  Colleen Parker, DOB 06/24/1946, MRN [id number]  PCP:  Colleen Olszewski, MD  Cardiologist:  Colleen Ishikawa, MD  Electrophysiologist:  None   Referring MD: Colleen Olszewski, MD   Chief Complaint  Patient presents with   Coronary Artery Disease    History of Present Illness:    Colleen Parker is a 76 y.o. female with a hx of asthma, migraines who presents for follow-up.  She was referred by Dr. Dorris Parker for evaluation of aortic aneurysm, initially seen on 05/15/2021 she presented with shortness of breath to the ED in June 2022.  CTPA showed no PE but found to have 5.3-5.4 cm ascending aortic aneurysm (no contrast suboptimal as was timed to review pulmonary arteries).  She saw Dr. Dorris Parker in clinic and CTA chest was ordered on 05/06/21 which showed 5.3 cm ascending aortic aneurysm.  Dr. Dorris Parker recommended surgery and referred to cardiology for cardiac catheterization prior to surgical repair.  She declined cardiac catheterization but was willing to undergo coronary CTA.  Coronary CTA 05/21/2021 showed calcium score 319 (83rd percentile), nonobstructive CAD.  Family history includes maternal grandmother died from aneurysm, mother had an aortic aneurysm and brother has an abdominal aneurysm.  Since last clinic visit, she reports that she is doing well.  Denies any chest pain, dyspnea, lower extremity edema, or palpitations.  Reports occasional lightheadedness but denies any syncope.  Brought home BP log, appears controlled.  Past Medical History:  Diagnosis Date   Chronic headaches    Menopausal state    Migraines    Recurrent sinus infections    Vascular dementia without behavioral disturbance, psychotic disturbance, mood disturbance, or anxiety (HCC)     Past Surgical History:  Procedure Laterality Date   DILATION AND CURETTAGE OF UTERUS     abnl cells   TONSILLECTOMY  1956    Current Medications: Current Meds   Medication Sig   acetaminophen (TYLENOL) 500 MG tablet Take 1,000 mg by mouth as needed for mild pain or headache.   albuterol (VENTOLIN HFA) 108 (90 Base) MCG/ACT inhaler Inhale 2 puffs into the lungs 3 (three) times daily as needed for wheezing or shortness of breath.   alendronate (FOSAMAX) 70 MG tablet Take 70 mg by mouth once a week.   aspirin EC 81 MG tablet Take 81 mg by mouth daily. Swallow whole.   AZO-CRANBERRY PO Take 1 tablet by mouth in the morning and at bedtime.   cholecalciferol (VITAMIN D3) 25 MCG (1000 UNIT) tablet Take 1,000 Units by mouth daily.   loratadine (CLARITIN) 10 MG tablet Take 10 mg by mouth daily as needed for allergies.   meloxicam (MOBIC) 15 MG tablet Take 15 mg by mouth as needed for pain.   memantine (NAMENDA) 10 MG tablet Take 1 tablet (10 mg total) by mouth 2 (two) times daily.   Probiotic Product (CULTURELLE PROBIOTICS) CHEW Chew 1 tablet by mouth daily.   rosuvastatin (CRESTOR) 10 MG tablet Take 1 tablet (10 mg total) by mouth daily.   sennosides-docusate sodium (SENOKOT-S) 8.6-50 MG tablet Take 1 tablet by mouth daily.     Allergies:   Penicillins, Quinolones, and Sulfamethoxazole   Social History   Socioeconomic History   Marital status: Widowed    Spouse name: Not on file   Number of children: 0   Years of education: BA   Highest education level: Bachelor's degree (e.g., BA, AB, BS)  Occupational History  Occupation: Retired     Comment: Runner, broadcasting/film/video retired  Tobacco Use   Smoking status: Never   Smokeless tobacco: Never  Substance and Sexual Activity   Alcohol use: Not Currently    Comment: ocass   Drug use: Never   Sexual activity: Not on file  Other Topics Concern   Not on file  Social History Narrative   Lives at home and younger sister lives with her    Caffeine use: coffee  And tea sometimes   Right handed   Social Determinants of Health   Financial Resource Strain: Not on file  Food Insecurity: No Food Insecurity (06/14/2022)    Hunger Vital Sign    Worried About Running Out of Food in the Last Year: Never true    Ran Out of Food in the Last Year: Never true  Transportation Needs: Not on file  Physical Activity: Not on file  Stress: Not on file  Social Connections: Not on file     Family History: The patient's family history includes Alzheimer's disease in her mother; Heart attack in her father and mother; Heart disease in her brother.  ROS:   Please see the history of present illness.     All other systems reviewed and are negative.  EKGs/Labs/Other Studies Reviewed:    The following studies were reviewed today:   EKG:  03/17/22: Sinus tachycardia, rate 116, poor R wave progression, less than 1 mm ST depressions in leads I, aVL  Recent Labs: 11/13/2021: B Natriuretic Peptide 332.1 11/14/2021: Magnesium 1.9 06/12/2022: ALT 18 06/13/2022: BUN 13; Creatinine, Ser 0.69; Hemoglobin 10.2; Platelets 292; Potassium 3.6; Sodium 141  Recent Lipid Panel    Component Value Date/Time   CHOL 189 03/17/2022 1409   TRIG 122 03/17/2022 1409   HDL 44 03/17/2022 1409   CHOLHDL 4.3 03/17/2022 1409   LDLCALC 123 (H) 03/17/2022 1409    Physical Exam:    VS:  BP 136/79   Pulse (!) 113   Ht 5\' 5"  (1.651 m)   Wt 132 lb 9.6 oz (60.1 kg)   SpO2 93%   BMI 22.07 kg/m     Wt Readings from Last 3 Encounters:  09/11/22 132 lb 9.6 oz (60.1 kg)  06/25/22 128 lb (58.1 kg)  06/12/22 138 lb 14.2 oz (63 kg)     GEN:  Well nourished, well developed in no acute distress HEENT: Normal NECK: No JVD; No carotid bruits LYMPHATICS: No lymphadenopathy CARDIAC: RRR, no murmurs, rubs, gallops RESPIRATORY:  Clear to auscultation without rales, wheezing or rhonchi  ABDOMEN: Soft, non-tender, non-distended MUSCULOSKELETAL:  No edema; No deformity  SKIN: Warm and dry NEUROLOGIC:  Alert and oriented x 3 PSYCHIATRIC:  Normal affect   ASSESSMENT:    1. Thoracic aortic aneurysm without rupture, unspecified part (HCC)   2.  Hyperlipidemia, unspecified hyperlipidemia type   3. Coronary artery disease involving native coronary artery of native heart without angina pectoris   4. Bilateral carotid artery stenosis      PLAN:     Thoracic aortic aneurysm: CTA chest was ordered on 05/06/21 which showed 5.3 cm ascending aortic aneurysm.  Echo 04/24/2021 showed EF 50 to 55%, normal RV function, small pericardial effusion, mild AI.  Repeat CTA chest 10/2021 showed stable aortic aneurysm.  BP appears controlled.  She is following with thoracic surgery.  CT chest 05/20/2022 showed stable 5.2 cm ascending thoracic aortic aneurysm.  Nonobstructive CAD: Coronary CTA 05/21/2021 showed calcium score 319 (83rd percentile), nonobstructive CAD.  Echocardiogram 04/24/2021  showed EF 50 to 55%, normal diastolic function, normal RV function, small pericardial effusion, mild AI -LDL 123 on 03/17/2022, started rosuvastatin 10 mg daily.  Check lipid panel  Carotid artery stenosis: CTA chest 05/20/2022 noted severe narrowing of proximal left internal carotid artery.  Carotid duplex 05/22/2022 showed 1 to 39% right carotid stenosis, 40 to 59% left carotid stenosis.  Continue aspirin, statin  RTC in 6 months  Medication Adjustments/Labs and Tests Ordered: Current medicines are reviewed at length with the patient today.  Concerns regarding medicines are outlined above.  Orders Placed This Encounter  Procedures   Lipid panel    No orders of the defined types were placed in this encounter.    Patient Instructions  Medication Instructions:  Your physician recommends that you continue on your current medications as directed. Please refer to the Current Medication list given to you today.  *If you need a refill on your cardiac medications before your next appointment, please call your pharmacy*   Lab Work: Lipid today  If you have labs (blood work) drawn today and your tests are completely normal, you will receive your results only by: MyChart  Message (if you have MyChart) OR A paper copy in the mail If you have any lab test that is abnormal or we need to change your treatment, we will call you to review the results.   Follow-Up: At [facility], you and your health needs are our priority.  As part of our continuing mission to provide you with exceptional heart care, we have created designated Provider Care Teams.  These Care Teams include your primary Cardiologist (physician) and Advanced Practice Providers (APPs -  Physician Assistants and Nurse Practitioners) who all work together to provide you with the care you need, when you need it.  We recommend signing up for the patient portal called "MyChart".  Sign up information is provided on this After Visit Summary.  MyChart is used to connect with patients for Virtual Visits (Telemedicine).  Patients are able to view lab/test results, encounter notes, upcoming appointments, etc.  Non-urgent messages can be sent to your provider as well.   To learn more about what you can do with MyChart, go to [email or web address].    Your next appointment:   6 month(s)  The format for your next appointment:   In Person  Provider:   Little Ishikawa, MD            Signed, Colleen Ishikawa, MD  09/11/2022 11:33 PM    [facility]

## 2022-09-12 LAB — LIPID PANEL
Chol/HDL Ratio: 2.9 ratio (ref 0.0–4.4)
Cholesterol, Total: 134 mg/dL (ref 100–199)
HDL: 46 mg/dL (ref >39)
LDL Chol Calc (NIH): 66 mg/dL (ref 0–99)
Triglycerides: 121 mg/dL (ref 0–149)
VLDL Cholesterol Cal: 22 mg/dL (ref 5–40)

## 2022-09-17 LAB — LIPID PANEL
Chol/HDL Ratio: 2.8 ratio (ref 0.0–4.4)
Cholesterol, Total: 123 mg/dL (ref 100–199)
HDL: 44 mg/dL (ref >39)
LDL Chol Calc (NIH): 63 mg/dL (ref 0–99)
Triglycerides: 83 mg/dL (ref 0–149)
VLDL Cholesterol Cal: 16 mg/dL (ref 5–40)

## 2022-11-02 ENCOUNTER — Ambulatory Visit: Payer: Medicare Other | Admitting: Podiatry

## 2022-11-19 ENCOUNTER — Encounter: Payer: Self-pay | Admitting: Physician Assistant

## 2022-11-23 ENCOUNTER — Ambulatory Visit: Payer: Medicare Other | Admitting: Podiatry

## 2022-12-16 ENCOUNTER — Ambulatory Visit: Payer: Medicare Other | Admitting: Physician Assistant

## 2022-12-21 ENCOUNTER — Ambulatory Visit (INDEPENDENT_AMBULATORY_CARE_PROVIDER_SITE_OTHER): Payer: Medicare Other | Admitting: Podiatry

## 2022-12-21 ENCOUNTER — Encounter: Payer: Self-pay | Admitting: Podiatry

## 2022-12-21 DIAGNOSIS — M79675 Pain in left toe(s): Secondary | ICD-10-CM | POA: Diagnosis not present

## 2022-12-21 DIAGNOSIS — F01C0 Vascular dementia, severe, without behavioral disturbance, psychotic disturbance, mood disturbance, and anxiety: Secondary | ICD-10-CM | POA: Diagnosis not present

## 2022-12-21 DIAGNOSIS — M79674 Pain in right toe(s): Secondary | ICD-10-CM | POA: Diagnosis not present

## 2022-12-21 DIAGNOSIS — B351 Tinea unguium: Secondary | ICD-10-CM | POA: Diagnosis not present

## 2022-12-21 NOTE — Progress Notes (Signed)
This patient returns to the office for evaluation and treatment of long thick painful nails .  This patient is unable to trim her own nails since the patient cannot reach her feet.  Patient says the nails are painful walking and wearing his shoes.  She returns for preventive foot care services.  She presents with female caregiver.  General Appearance  Alert, conversant and in no acute stress.  Vascular  Dorsalis pedis and posterior tibial  pulses are  weakly palpable  bilaterally.  Capillary return is within normal limits  bilaterally. Cold feet  bilaterally.  Neurologic  Senn-Weinstein monofilament wire test within normal limits  bilaterally. Muscle power within normal limits bilaterally.  Nails Thick disfigured discolored nails with subungual debris  from hallux to fifth toes bilaterally. No evidence of bacterial infection or drainage bilaterally.  Orthopedic  No limitations of motion  feet .  No crepitus or effusions noted.  HAV  B/L.  Hammet toes  B/L.  Skin  normotropic skin with no porokeratosis noted bilaterally.  No signs of infections or ulcers noted.     Onychomycosis  Pain in toes right foot  Pain in toes left foot  Debridement  of nails  1-5  B/L with a nail nipper.  Nails were then filed using a dremel tool with no incidents.    RTC 3 months    Gardiner Barefoot DPM

## 2022-12-25 ENCOUNTER — Ambulatory Visit: Payer: Medicare Other | Admitting: Physician Assistant

## 2023-01-12 ENCOUNTER — Encounter: Payer: Medicare Other | Admitting: Psychology

## 2023-01-19 ENCOUNTER — Encounter: Payer: Medicare Other | Admitting: Psychology

## 2023-01-22 ENCOUNTER — Encounter: Payer: Self-pay | Admitting: Physician Assistant

## 2023-01-22 ENCOUNTER — Ambulatory Visit (INDEPENDENT_AMBULATORY_CARE_PROVIDER_SITE_OTHER): Payer: Medicare Other | Admitting: Physician Assistant

## 2023-01-22 VITALS — BP 132/60 | HR 70 | Ht 65.0 in | Wt 128.0 lb

## 2023-01-22 DIAGNOSIS — F01B0 Vascular dementia, moderate, without behavioral disturbance, psychotic disturbance, mood disturbance, and anxiety: Secondary | ICD-10-CM | POA: Diagnosis not present

## 2023-01-22 DIAGNOSIS — F0280 Dementia in other diseases classified elsewhere without behavioral disturbance: Secondary | ICD-10-CM

## 2023-01-22 DIAGNOSIS — G3101 Pick's disease: Secondary | ICD-10-CM

## 2023-01-22 MED ORDER — MEMANTINE HCL 10 MG PO TABS: 10.0000 mg | ORAL_TABLET | Freq: Two times a day (BID) | ORAL | 3 refills | Status: DC

## 2023-01-22 NOTE — Patient Instructions (Signed)
It was a pleasure to see you today at our office.   Recommendations:  Continue memantine 10 mg  two times a day   Continue Speech therapy  Follow up in 6 months  Whom to call:  Memory  decline, memory medications: Call our office [phone]   For psychiatric meds, mood meds: Please have your primary care physician manage these medications.   Counseling regarding caregiver distress, including caregiver depression, anxiety and issues regarding community resources, adult day care programs, adult living facilities, or memory care questions:   Feel free to contact Misty Lisabeth Register, Social Worker at [phone]   For assessment of decision of mental capacity and competency:  Call Dr. Erick Blinks, geriatric psychiatrist at [phone]  For guidance in geriatric dementia issues please call [facility] [phone]  For guidance regarding [facility] and if placement were needed at the facility, contact Sidney Ace, Social Worker tel: [phone]  If you have any severe symptoms of a stroke, or other severe issues such as confusion,severe chills or fever, etc call 911 or go to the ER as you may need to be evaluated further   Feel free to visit Facebook page " [employer]" for tips of how to care for people with memory problems.   Feel free to go to the following database for funded clinical studies conducted around the world: RankChecks.se   https://www.triadclinicaltrials.com/     RECOMMENDATIONS FOR ALL PATIENTS WITH MEMORY PROBLEMS: 1. Continue to exercise (Recommend 30 minutes of walking everyday, or 3 hours every week) 2. Increase social interactions - continue going to [place] and enjoy social gatherings with friends and family 3. Eat healthy, avoid fried foods and eat more fruits and vegetables 4. Maintain adequate blood pressure, blood sugar, and blood cholesterol level. Reducing the risk of stroke and cardiovascular disease  also helps promoting better memory. 5. Avoid stressful situations. Live a simple life and avoid aggravations. Organize your time and prepare for the next day in anticipation. 6. Sleep well, avoid any interruptions of sleep and avoid any distractions in the bedroom that may interfere with adequate sleep quality 7. Avoid sugar, avoid sweets as there is a strong link between excessive sugar intake, diabetes, and cognitive impairment We discussed the Mediterranean diet, which has been shown to help patients reduce the risk of progressive memory disorders and reduces cardiovascular risk. This includes eating fish, eat fruits and green leafy vegetables, nuts like almonds and hazelnuts, walnuts, and also use olive oil. Avoid fast foods and fried foods as much as possible. Avoid sweets and sugar as sugar use has been linked to worsening of memory function.  There is always a concern of gradual progression of memory problems. If this is the case, then we may need to adjust level of care according to patient needs. Support, both to the patient and caregiver, should then be put into place.      You have been referred for a neuropsychological evaluation (i.e., evaluation of memory and thinking abilities). Please bring someone with you to this appointment if possible, as it is helpful for the doctor to hear from both you and another adult who knows you well. Please bring eyeglasses and hearing aids if you wear them.    The evaluation will take approximately 3 hours and has two parts:   The first part is a clinical interview with the neuropsychologist (Dr. Milbert Coulter or Dr. Roseanne Reno). During the interview, the neuropsychologist will speak with you and the individual you brought to  the appointment.    The second part of the evaluation is testing with the doctor's technician Annabelle Harman or Selena Batten). During the testing, the technician will ask you to remember different types of material, solve problems, and answer some  questionnaires. Your family member will not be present for this portion of the evaluation.   Please note: We must reserve several hours of the neuropsychologist's time and the psychometrician's time for your evaluation appointment. As such, there is a No-Show fee of $100. If you are unable to attend any of your appointments, please contact our office as soon as possible to reschedule.    FALL PRECAUTIONS: Be cautious when walking. Scan the area for obstacles that may increase the risk of trips and falls. When getting up in the mornings, sit up at the edge of the bed for a few minutes before getting out of bed. Consider elevating the bed at the head end to avoid drop of blood pressure when getting up. Walk always in a well-lit room (use night lights in the walls). Avoid area rugs or power cords from appliances in the middle of the walkways. Use a walker or a cane if necessary and consider physical therapy for balance exercise. Get your eyesight checked regularly.  FINANCIAL OVERSIGHT: Supervision, especially oversight when making financial decisions or transactions is also recommended.  HOME SAFETY: Consider the safety of the kitchen when operating appliances like stoves, microwave oven, and blender. Consider having supervision and share cooking responsibilities until no longer able to participate in those. Accidents with firearms and other hazards in the house should be identified and addressed as well.   ABILITY TO BE LEFT ALONE: If patient is unable to contact 911 operator, consider using LifeLine, or when the need is there, arrange for someone to stay with patients. Smoking is a fire hazard, consider supervision or cessation. Risk of wandering should be assessed by caregiver and if detected at any point, supervision and safe proof recommendations should be instituted.  MEDICATION SUPERVISION: Inability to self-administer medication needs to be constantly addressed. Implement a mechanism to ensure  safe administration of the medications.   DRIVING: Regarding driving, in patients with progressive memory problems, driving will be impaired. We advise to have someone else do the driving if trouble finding directions or if minor accidents are reported. Independent driving assessment is available to determine safety of driving.   If you are interested in the driving assessment, you can contact the following:  [facility] [employer] in [place] [phone]  [facility] [phone]  [facility] [phone] or [phone]    Mediterranean Diet A Mediterranean diet refers to food and lifestyle choices that are based on the traditions of countries located on the [employer]. This way of eating has been shown to help prevent certain conditions and improve outcomes for people who have chronic diseases, like kidney disease and heart disease. What are tips for following this plan? Lifestyle  Cook and eat meals together with your family, when possible. Drink enough fluid to keep your urine clear or pale yellow. Be physically active every day. This includes: Aerobic exercise like running or swimming. Leisure activities like gardening, walking, or housework. Get 7-8 hours of sleep each night. If recommended by your health care provider, drink red wine in moderation. This means 1 glass a day for nonpregnant women and 2 glasses a day for men. A glass of wine equals 5 oz (150 mL). Reading food labels  Check the serving  size of packaged foods. For foods such as rice and pasta, the serving size refers to the amount of cooked product, not dry. Check the total fat in packaged foods. Avoid foods that have saturated fat or trans fats. Check the ingredients list for added sugars, such as corn syrup. Shopping  At the grocery store, buy most of your food from the areas near the walls of the store. This includes: Fresh fruits and  vegetables (produce). Grains, beans, nuts, and seeds. Some of these may be available in unpackaged forms or large amounts (in bulk). Fresh seafood. Poultry and eggs. Low-fat dairy products. Buy whole ingredients instead of prepackaged foods. Buy fresh fruits and vegetables in-season from local farmers markets. Buy frozen fruits and vegetables in resealable bags. If you do not have access to quality fresh seafood, buy precooked frozen shrimp or canned fish, such as tuna, salmon, or sardines. Buy small amounts of raw or cooked vegetables, salads, or olives from the deli or salad bar at your store. Stock your pantry so you always have certain foods on hand, such as olive oil, canned tuna, canned tomatoes, rice, pasta, and beans. Cooking  Cook foods with extra-virgin olive oil instead of using butter or other vegetable oils. Have meat as a side dish, and have vegetables or grains as your main dish. This means having meat in small portions or adding small amounts of meat to foods like pasta or stew. Use beans or vegetables instead of meat in common dishes like chili or lasagna. Experiment with different cooking methods. Try roasting or broiling vegetables instead of steaming or sauteing them. Add frozen vegetables to soups, stews, pasta, or rice. Add nuts or seeds for added healthy fat at each meal. You can add these to yogurt, salads, or vegetable dishes. Marinate fish or vegetables using olive oil, lemon juice, garlic, and fresh herbs. Meal planning  Plan to eat 1 vegetarian meal one day each week. Try to work up to 2 vegetarian meals, if possible. Eat seafood 2 or more times a week. Have healthy snacks readily available, such as: Vegetable sticks with hummus. Greek yogurt. Fruit and nut trail mix. Eat balanced meals throughout the week. This includes: Fruit: 2-3 servings a day Vegetables: 4-5 servings a day Low-fat dairy: 2 servings a day Fish, poultry, or lean meat: 1 serving a  day Beans and legumes: 2 or more servings a week Nuts and seeds: 1-2 servings a day Whole grains: 6-8 servings a day Extra-virgin olive oil: 3-4 servings a day Limit red meat and sweets to only a few servings a month What are my food choices? Mediterranean diet Recommended Grains: Whole-grain pasta. Brown rice. Bulgar wheat. Polenta. Couscous. Whole-wheat bread. Orpah Cobb. Vegetables: Artichokes. Beets. Broccoli. Cabbage. Carrots. Eggplant. Green beans. Chard. Kale. Spinach. Onions. Leeks. Peas. Squash. Tomatoes. Peppers. Radishes. Fruits: Apples. Apricots. Avocado. Berries. Bananas. Cherries. Dates. Figs. Grapes. Lemons. Melon. Oranges. Peaches. Plums. Pomegranate. Meats and other protein foods: Beans. Almonds. Sunflower seeds. Pine nuts. Peanuts. Cod. Salmon. Scallops. Shrimp. Tuna. Tilapia. Clams. Oysters. Eggs. Dairy: Low-fat milk. Cheese. Greek yogurt. Beverages: Water. Red wine. Herbal tea. Fats and oils: Extra virgin olive oil. Avocado oil. Grape seed oil. Sweets and desserts: [place] yogurt with honey. Baked apples. Poached pears. Trail mix. Seasoning and other foods: Basil. Cilantro. Coriander. Cumin. Mint. Parsley. Sage. Rosemary. Tarragon. Garlic. Oregano. Thyme. Pepper. Balsalmic vinegar. Tahini. Hummus. Tomato sauce. Olives. Mushrooms. Limit these Grains: Prepackaged pasta or rice dishes. Prepackaged cereal with added sugar. Vegetables: Deep fried potatoes (french  fries). Fruits: Fruit canned in syrup. Meats and other protein foods: Beef. Pork. Lamb. Poultry with skin. Hot dogs. Tomasa Blase. Dairy: Ice cream. Sour cream. Whole milk. Beverages: Juice. Sugar-sweetened soft drinks. Beer. Liquor and spirits. Fats and oils: Butter. Canola oil. Vegetable oil. Beef fat (tallow). Lard. Sweets and desserts: Cookies. Cakes. Pies. Candy. Seasoning and other foods: Mayonnaise. Premade sauces and marinades. The items listed may not be a complete list. Talk with your dietitian about what  dietary choices are right for you. Summary The Mediterranean diet includes both food and lifestyle choices. Eat a variety of fresh fruits and vegetables, beans, nuts, seeds, and whole grains. Limit the amount of red meat and sweets that you eat. Talk with your health care provider about whether it is safe for you to drink red wine in moderation. This means 1 glass a day for nonpregnant women and 2 glasses a day for men. A glass of wine equals 5 oz (150 mL). This information is not intended to replace advice given to you by your health care provider. Make sure you discuss any questions you have with your health care provider. Document Released: 04/30/2016 Document Revised: 06/02/2016 Document Reviewed: 04/30/2016 Elsevier Interactive Patient Education  2017 [employer].   We have sent a referral to [facility] for your MRI and they will call you directly to schedule your appointment. They are located at [address] [facility]. If you need to contact them directly please call [phone].

## 2023-01-22 NOTE — Progress Notes (Signed)
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 RH female with  a history of hypertension, hyperlipidemia, QT prolongation, history of traumatic hematoma of the head seen today in follow up for memory loss. Patient is currently on memantine 10 mg bid . Memory is stable. She continues to do Home ST and she is doing PT for strength and balance as well.    Recommendations    Follow up in  6 months. Continue ST for possible PPA Continue Memantine 10 mg twice daily. Side effects were discussed  Recommend good control of her cardiovascular risk factors Continue to control mood as per PCP    Subjective:    This patient is accompanied in the office by her daughter and her caregiver who supplements the history.  Previous records as well as any outside records available were reviewed prior to todays visit. Patient was last seen on  06/25/22. Last MoCA on 02/2022 was 3/ 30    Any changes in memory since last visit? " About the same". She is able to remember recent conversations. She has some difficulties with date and place. LTM is good. She continues to do ST even after having finish the sessions. She had Covid in Feb 2024 "but she is ok now".  repeats oneself? No. Finished ST in January, and continues to do it at home.  Disoriented when walking into a room?  Patient denies except occasionally not remembering what patient came to the room for    Leaving objects in unusual places?    denies   Wandering behavior?  denies   Any personality changes since last visit?  denies   Any worsening depression?:  denies   Hallucinations or paranoia?  denies   Seizures?    denies    Any sleep changes?   Sleeps well. Denies vivid dreams, REM behavior or sleepwalking   Sleep apnea?   denies   Any hygiene concerns?    denies   Independent of bathing and dressing? She cannot tell wether it is soap or shampoo, so she needs assistance with  hygiene.  Does the patient needs help with medications? Caregiver is in charge   Who is in charge of the finances?  Daughter is in charge     Any changes in appetite? No, she eats well.   Patient have trouble swallowing?  denies   Does the patient cook?  Yes, does not forget her common recipes   Any kitchen accidents such as leaving the stove on? Patient denies   Any headaches?   denies   Chronic back pain  denies   Ambulates with difficulty?  Needs a walker to ambulate, she is dong PT for strength and balance  Recent falls or head injuries? Denies  Unilateral weakness, numbness or tingling?    denies   Any tremors?  denies   Any anosmia?  Patient denies   Any incontinence of urine?  Endorsed, wears pullups. Last UTI on Jan 2024 Any bowel dysfunction?   denies      Patient lives with daughter and caregiver   Does the patient drive?No    Initial visit 03/03/22  How long did patient have memory difficulties? 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  Then she began having difficulties with short-term memory, and for the last year,  she has demonstrated difficulties with speech, "knowing what I want to say, but it does not come out of my mouth ".  She continues to enjoy reading, watching TV and music. Patient lives with: sister and has an aide coming for 8 hrs repeats oneself? Denies  Disoriented when walking into a room?  Patient denies except "always getting confused when trying to find a bathroom " leaving objects in unusual places?  Patient denies   Ambulates  with difficulty?   Patient denies, but uses a walker for stability.   "Walks a lot" Recent falls?  Patient denies   Any head injuries?  She has a history of concussion of the forehead on 11/13/2021, and on 07/22/2020 after a fall after UTI and Requiring hospitalization history of seizures?   Patient denies   Wandering behavior?   Patient denies   Patient drives?   Patient no longer drives. Stopped after the head injury  Any mood changes such irritability agitation?  Patient denies  "She is sweet" Any history of depression?:  Patient denies   Hallucinations?  Patient denies   Paranoia?  Patient denies   Patient reports that he sleeps well without vivid dreams, REM behavior or sleepwalking    History of sleep apnea?  Patient denies   Any hygiene concerns?  Patient denies   Independent of bathing and dressing?  Endorsed  Does the patient needs help with medications?  Caregiver in charge Who is in charge of the finances? Sister [name] is in charge  Any changes in appetite?  Patient denies   Patient have trouble swallowing? Patient denies   Does the patient cook?  Patient cooks occasionally  Any kitchen accidents such as leaving the stove on? Patient denies   Any headaches?  Patient denies   The double vision? Patient denies   Any focal numbness or tingling?  Patient denies   Chronic back pain Patient denies   Unilateral weakness?  Patient denies   Any tremors?  Patient denies   Any history of anosmia?  Patient denies   Any incontinence of urine?  "Only at night may need a diaper ".  She had a recent UTI and requiring hospitalization in February 2023 Any bowel dysfunction?   Patient denies   History of heavy alcohol intake?  Patient denies   History of heavy tobacco use?  Patient denies   Family history of dementia?  Strong family history of Alzheimer's disease , Mo, maternal aunt, maternal first cousin Engineer, maintenance (IT) (valedictorian), retired Airline pilot PREVIOUS MEDICATIONS:   CURRENT MEDICATIONS:  Outpatient Encounter Medications as of 01/22/2023  Medication Sig   acetaminophen (TYLENOL) 500 MG tablet Take 1,000 mg by mouth as needed for mild pain or headache.   albuterol (VENTOLIN HFA) 108 (90 Base) MCG/ACT inhaler Inhale 2 puffs into the lungs 3 (three) times daily as needed for wheezing or shortness of breath.    alendronate (FOSAMAX) 70 MG tablet Take 70 mg by mouth once a week.   aspirin EC 81 MG tablet Take 81 mg by mouth daily. Swallow whole.   AZO-CRANBERRY PO Take 1 tablet by mouth in the morning and at bedtime.   cholecalciferol (VITAMIN D3) 25 MCG (1000 UNIT) tablet Take 1,000 Units by mouth daily.   loratadine (CLARITIN) 10 MG tablet Take 10 mg by mouth daily as needed for allergies.   meloxicam (MOBIC) 15 MG tablet Take 15 mg by mouth as needed for pain.   memantine (NAMENDA) 10 MG tablet Take 1 tablet (10 mg  total) by mouth 2 (two) times daily.   Probiotic Product (CULTURELLE PROBIOTICS) CHEW Chew 1 tablet by mouth daily.   rosuvastatin (CRESTOR) 10 MG tablet Take 1 tablet (10 mg total) by mouth daily.   sennosides-docusate sodium (SENOKOT-S) 8.6-50 MG tablet Take 1 tablet by mouth daily.   No facility-administered encounter medications on file as of 01/22/2023.        No data to display            02/27/2022    9:00 AM  Montreal Cognitive Assessment   Visuospatial/ Executive (0/5) 0  Naming (0/3) 1  Attention: Read list of digits (0/2) 2  Attention: Read list of letters (0/1) 0  Attention: Serial 7 subtraction starting at 100 (0/3) 0  Language: Repeat phrase (0/2) 0  Language : Fluency (0/1) 0  Abstraction (0/2) 0  Delayed Recall (0/5) 0  Orientation (0/6) 0  Total 3  Adjusted Score (based on education) 3    Objective:     PHYSICAL EXAMINATION:    VITALS:   Vitals:   01/22/23 1259  BP: 132/60  Pulse: 70  SpO2: 91%  Weight: 128 lb (58.1 kg)  Height: 5\' 5"  (1.651 m)    GEN:  The patient appears stated age and is in NAD. HEENT:  Normocephalic, atraumatic.   Neurological examination:  General: NAD, well-groomed, appears stated age. Orientation: The patient is alert. Oriented to person, place and date Cranial nerves: There is good facial symmetry.The speech is fluent and clear. No aphasia or dysarthria. Fund of knowledge is appropriate. Recent and remote  memory are impaired. Attention and concentration are reduced.  Able to name objects and repeat phrases.  Hearing is intact to conversational tone. *** Sensation: Sensation is intact to light touch throughout Motor: Strength is at least antigravity x4. DTR's 2/4 in UE/LE     Movement examination: Tone: There is normal tone in the UE/LE Abnormal movements:  no tremor.  No myoclonus.  No asterixis.   Coordination:  There is no decremation with RAM's. Normal finger to nose  Gait and Station: The patient has no difficulty arising out of a deep-seated chair without the use of the hands. The patient's stride length is good.  Gait is cautious and narrow.    Thank you for allowing [place] the opportunity to participate in the care of this nice patient. Please do not hesitate to contact us for any questions or concerns.   Total time spent on today's visit was *** minutes dedicated to this patient today, preparing to see patient, examining the patient, ordering tests and/or medications and counseling the patient, documenting clinical information in the EHR or other health record, independently interpreting results and communicating results to the patient/family, discussing treatment and goals, answering patient's questions and coordinating care.  Cc:  Lula Olszewski, MD  Marlowe Kays 01/22/2023 1:13 PM

## 2023-03-17 NOTE — Progress Notes (Signed)
Cardiology Clinic Note   Patient Name: Colleen Parker Date of Encounter: 03/19/2023  Primary Care Provider:  Lula Olszewski, MD Primary Cardiologist:  Little Ishikawa, MD  Patient Profile    77 year old female with history of aortic aneurysm found to have 5.3-5.4 ascending aortic aneurysm, with a follow-up CTA showing 5.3 ascending aortic aneurysm, with plan surgical repair through Dr. Orson Aloe.  Cardiac catheterization was refused but she did undergo coronary CTA on 05/21/2021 with a calcium score 319 and nonobstructive CAD.    Other history includes advanced dementia, carotid stenosis, with CTA revealing severe narrowing of the proximal left internal carotid artery, carotid duplex revealed 39% right carotid stenosis and 40 to 59% left carotid stenosis, she was continued medical management..  Recent hospitalization September 2023 in the setting of urosepsis.  She has not had surgical repair of AAA at this point.  Last seen in the office on 09/11/2022.  Past Medical History    Past Medical History:  Diagnosis Date   Chronic headaches    Menopausal state    Migraines    Recurrent sinus infections    Vascular dementia without behavioral disturbance, psychotic disturbance, mood disturbance, or anxiety (HCC)    Past Surgical History:  Procedure Laterality Date   DILATION AND CURETTAGE OF UTERUS     abnl cells   TONSILLECTOMY  1956    Allergies  Allergies  Allergen Reactions   Penicillins Shortness Of Breath   Quinolones Other (See Comments)    Aneurysm   Sulfamethoxazole Hives and Swelling    History of Present Illness    Colleen Parker returns today for ongoing assessment and management of thoracic aortic aneurysm, has not had any surgical intervention at this time, nonobstructive CAD, carotid artery disease and hypercholesterolemia.  As stated above she has recently been hospitalized September 2023 for urosepsis, she also has history of dementia.  She comes today with her  sister, and a caregiver.  She denies any complaints of pain, she has regained her strength after undergoing physical therapy and has been very sedentary prior to that.  She is now up walking around and more active.  She denies shortness of breath when she is up being more active.  She has been placed on B12 as it was low by her primary care provider which has helped with her energy.  She has a good appetite but does not drink enough fluids.  She is medically compliant.    Concerning AAA repair, she has seen Dr. Lavinia Sharps, and they are doing watchful waiting for now.  With no plan to have any surgical repair at this time.  Home Medications    Current Outpatient Medications  Medication Sig Dispense Refill   acetaminophen (TYLENOL) 500 MG tablet Take 1,000 mg by mouth as needed for mild pain or headache.     albuterol (VENTOLIN HFA) 108 (90 Base) MCG/ACT inhaler Inhale 2 puffs into the lungs 3 (three) times daily as needed for wheezing or shortness of breath.     alendronate (FOSAMAX) 70 MG tablet Take 70 mg by mouth once a week.     aspirin EC 81 MG tablet Take 81 mg by mouth daily. Swallow whole.     AZO-CRANBERRY PO Take 1 tablet by mouth in the morning and at bedtime.     cholecalciferol (VITAMIN D3) 25 MCG (1000 UNIT) tablet Take 1,000 Units by mouth daily.     Cyanocobalamin (VITAMIN B-12) 1000 MCG SUBL Place 1 tablet under the tongue daily.  loratadine (CLARITIN) 10 MG tablet Take 10 mg by mouth daily as needed for allergies.     meloxicam (MOBIC) 15 MG tablet Take 15 mg by mouth as needed for pain.     memantine (NAMENDA) 10 MG tablet Take 1 tablet (10 mg total) by mouth 2 (two) times daily. 180 tablet 3   Probiotic Product (CULTURELLE PROBIOTICS) CHEW Chew 1 tablet by mouth daily. 30 tablet 0   sennosides-docusate sodium (SENOKOT-S) 8.6-50 MG tablet Take 1 tablet by mouth daily. 30 tablet 0   rosuvastatin (CRESTOR) 10 MG tablet Take 1 tablet (10 mg total) by mouth daily. 90 tablet 3   No  current facility-administered medications for this visit.     Family History    Family History  Problem Relation Age of Onset   Alzheimer's disease Mother    Heart attack Mother    Heart attack Father    Heart disease Brother    She indicated that her mother is deceased. She indicated that her father is deceased. She indicated that her sister is alive. She indicated that her brother is alive.  Social History    Social History   Socioeconomic History   Marital status: Widowed    Spouse name: Not on file   Number of children: 0   Years of education: BA   Highest education level: Bachelor's degree (e.g., BA, AB, BS)  Occupational History   Occupation: Retired     Comment: Runner, broadcasting/film/video retired  Tobacco Use   Smoking status: Never   Smokeless tobacco: Never  Substance and Sexual Activity   Alcohol use: Not Currently    Comment: ocass   Drug use: Never   Sexual activity: Not on file  Other Topics Concern   Not on file  Social History Narrative   Lives at home and younger sister lives with her    Caffeine use: coffee  And tea sometimes   Right handed   Social Determinants of Health   Financial Resource Strain: Not on file  Food Insecurity: No Food Insecurity (06/14/2022)   Hunger Vital Sign    Worried About Running Out of Food in the Last Year: Never true    Ran Out of Food in the Last Year: Never true  Transportation Needs: Not on file  Physical Activity: Not on file  Stress: Not on file  Social Connections: Not on file  Intimate Partner Violence: Not on file     Review of Systems    General:  No chills, fever, night sweats or weight changes.  Cardiovascular:  No chest pain, dyspnea on exertion, edema, orthopnea, palpitations, paroxysmal nocturnal dyspnea. Dermatological: No rash, lesions/masses Respiratory: No cough, dyspnea Urologic: No hematuria, dysuria Abdominal:   No nausea, vomiting, diarrhea, bright red blood per rectum, melena, or hematemesis Neurologic:   No visual changes, wkns, changes in mental status. All other systems reviewed and are otherwise negative except as noted above.     Physical Exam    VS:  BP 92/62   Pulse (!) 110   Ht 5\' 5"  (1.651 m)   Wt 126 lb 3.2 oz (57.2 kg)   SpO2 100%   BMI 21.00 kg/m  , BMI Body mass index is 21 kg/m.     GEN: Well nourished, well developed, in no acute distress.  Pale HEENT: normal. Neck: Supple, no JVD, left carotid bruits, none on the right, no masses. Cardiac: RRR, no murmurs, rubs, or gallops. No clubbing, cyanosis, edema.  Radials/DP/PT 2+ and equal bilaterally.  Respiratory:  Respirations regular and unlabored, clear to auscultation bilaterally. GI: Soft, nontender, nondistended, BS + x 4. MS: no deformity or atrophy. Skin: warm and dry, no rash. Neuro: Diminished strength but sensation is intact. Psych: Normal affect.  Accessory Clinical Findings     Sinus tachycardia Left axis deviation Possible Anterior infarct , age undetermined ST & T wave abnormality, consider lateral ischemia When compared with ECG of 12-Jun-2022 04:58,   Lab Results  Component Value Date   WBC 11.7 (H) 06/13/2022   HGB 10.2 (L) 06/13/2022   HCT 31.0 (L) 06/13/2022   MCV 90.4 06/13/2022   PLT 292 06/13/2022   Lab Results  Component Value Date   CREATININE 0.69 06/13/2022   BUN 13 06/13/2022   NA 141 06/13/2022   K 3.6 06/13/2022   CL 111 06/13/2022   CO2 18 (L) 06/13/2022   Lab Results  Component Value Date   ALT 18 06/12/2022   AST 25 06/12/2022   ALKPHOS 71 06/12/2022   BILITOT 0.5 06/12/2022   Lab Results  Component Value Date   CHOL 123 09/17/2022   HDL 44 09/17/2022   LDLCALC 63 09/17/2022   TRIG 83 09/17/2022   CHOLHDL 2.8 09/17/2022    No results found for: "HGBA1C"  Review of Prior Studies     Coronary CTA 05/20/2021 1. Coronary calcium score of 319. This was 83rd percentile for age and sex matched control.   2. Normal coronary origin with anterior takeoff of RCA  off the right coronary cusp with right dominance.   3.  Mild atherosclerosis.  CAD RADS 2.   4.  Recommend preventive therapy and risk factor modification.   Vas [place] AAA  04/23/2021 Abdominal Aorta: No evidence of an abdominal aortic aneurysm was  visualized. The largest aortic measurement is 2.1 cm.   Assessment & Plan   1.  Carotid artery disease: Most recent CTA revealed severe narrowing of the proximal left internal carotid artery, follow-up duplex revealing 39% right carotid stenosis and 40 to 59% left carotid stenosis.  Medical management with statin therapy, aspirin.   2.  AAA: Followed by Dr. Lavinia Sharps with no plans to have any surgical repair at this time and continue watchful waiting.  3. Tachycardia: May be related to dehydration as she is not drinking enough fluids each day. Not on any rate increasing medications. Labs were unremarkable on review. No evidence of decreased kidney function. She will increase her fluid intake.        Signed, Bettey Mare. Liborio Nixon, ANP, AACC   03/19/2023 4:34 PM      Office [phone] Fax [phone]  Notice: This dictation was prepared with Dragon dictation along with smaller phrase technology. Any transcriptional errors that result from this process are unintentional and may not be corrected upon review.

## 2023-03-19 ENCOUNTER — Encounter: Payer: Self-pay | Admitting: Adult Health

## 2023-03-19 ENCOUNTER — Ambulatory Visit: Payer: Medicare Other | Attending: Adult Health | Admitting: Adult Health

## 2023-03-19 VITALS — BP 92/62 | HR 110 | Ht 65.0 in | Wt 126.2 lb

## 2023-03-19 DIAGNOSIS — I2510 Atherosclerotic heart disease of native coronary artery without angina pectoris: Secondary | ICD-10-CM | POA: Diagnosis present

## 2023-03-19 DIAGNOSIS — I7121 Aneurysm of the ascending aorta, without rupture: Secondary | ICD-10-CM | POA: Diagnosis present

## 2023-03-19 DIAGNOSIS — R000 Tachycardia, unspecified: Secondary | ICD-10-CM | POA: Diagnosis present

## 2023-03-19 MED ORDER — ROSUVASTATIN CALCIUM 10 MG PO TABS: 10.0000 mg | ORAL_TABLET | Freq: Every day | ORAL | 3 refills | Status: DC

## 2023-03-19 NOTE — Patient Instructions (Signed)
Medication Instructions:  No changes *If you need a refill on your cardiac medications before your next appointment, please call your pharmacy*   Lab Work: No Labs  If you have labs (blood work) drawn today and your tests are completely normal, you will receive your results only by: MyChart Message (if you have MyChart) OR A paper copy in the mail If you have any lab test that is abnormal or we need to change your treatment, we will call you to review the results.   Testing/Procedures: No Testing   Follow-Up: At [facility], you and your health needs are our priority.  As part of our continuing mission to provide you with exceptional heart care, we have created designated Provider Care Teams.  These Care Teams include your primary Cardiologist (physician) and Advanced Practice Providers (APPs -  Physician Assistants and Nurse Practitioners) who all work together to provide you with the care you need, when you need it.  We recommend signing up for the patient portal called "MyChart".  Sign up information is provided on this After Visit Summary.  MyChart is used to connect with patients for Virtual Visits (Telemedicine).  Patients are able to view lab/test results, encounter notes, upcoming appointments, etc.  Non-urgent messages can be sent to your provider as well.   To learn more about what you can do with MyChart, go to [email or web address].    Your next appointment:   6 month(s)  Provider:   Little Ishikawa, MD

## 2023-03-22 ENCOUNTER — Ambulatory Visit (INDEPENDENT_AMBULATORY_CARE_PROVIDER_SITE_OTHER): Payer: Medicare Other | Admitting: Podiatry

## 2023-03-22 ENCOUNTER — Ambulatory Visit: Payer: Medicare Other | Admitting: Podiatry

## 2023-03-22 ENCOUNTER — Encounter: Payer: Self-pay | Admitting: Podiatry

## 2023-03-22 DIAGNOSIS — B351 Tinea unguium: Secondary | ICD-10-CM | POA: Diagnosis not present

## 2023-03-22 DIAGNOSIS — M79674 Pain in right toe(s): Secondary | ICD-10-CM | POA: Diagnosis not present

## 2023-03-22 DIAGNOSIS — M79675 Pain in left toe(s): Secondary | ICD-10-CM

## 2023-03-22 NOTE — Progress Notes (Signed)
This patient returns to the office for evaluation and treatment of long thick painful nails .  This patient is unable to trim her own nails since the patient cannot reach her feet.  Patient says the nails are painful walking and wearing his shoes.  She returns for preventive foot care services.  She presents with female caregiver.  General Appearance  Alert, conversant and in no acute stress.  Vascular  Dorsalis pedis and posterior tibial  pulses are  weakly palpable  bilaterally.  Capillary return is within normal limits  bilaterally. Cold feet  bilaterally.  Neurologic  Senn-Weinstein monofilament wire test within normal limits  bilaterally. Muscle power within normal limits bilaterally.  Nails Thick disfigured discolored nails with subungual debris  from hallux to fifth toes bilaterally. No evidence of bacterial infection or drainage bilaterally.  Orthopedic  No limitations of motion  feet .  No crepitus or effusions noted.  HAV  B/L.  Hammet toes  B/L.  Skin  normotropic skin with no porokeratosis noted bilaterally.  No signs of infections or ulcers noted.     Onychomycosis  Pain in toes right foot  Pain in toes left foot  Debridement  of nails  1-5  B/L with a nail nipper.  Nails were then filed using a dremel tool with no incidents.    RTC 3 months    Lashawndra Lampkins DPM   

## 2023-03-31 ENCOUNTER — Other Ambulatory Visit: Payer: Self-pay | Admitting: Surgery

## 2023-03-31 DIAGNOSIS — I7121 Aneurysm of the ascending aorta, without rupture: Secondary | ICD-10-CM

## 2023-06-01 ENCOUNTER — Ambulatory Visit: Payer: Medicare Other | Admitting: Vascular Surgery

## 2023-06-01 ENCOUNTER — Encounter (HOSPITAL_COMMUNITY): Payer: Medicare Other

## 2023-06-02 ENCOUNTER — Ambulatory Visit
Admission: RE | Admit: 2023-06-02 | Discharge: 2023-06-02 | Disposition: A | Discharge status: Home / Self Care | Payer: Medicare Other | Source: Ambulatory Visit | Attending: Surgery | Admitting: Surgery

## 2023-06-02 ENCOUNTER — Ambulatory Visit: Payer: Medicare Other | Admitting: Surgery

## 2023-06-02 VITALS — BP 102/72 | HR 106 | Resp 18 | Ht 65.0 in

## 2023-06-02 DIAGNOSIS — I7121 Aneurysm of the ascending aorta, without rupture: Secondary | ICD-10-CM

## 2023-06-02 MED ORDER — IOPAMIDOL (ISOVUE-370) INJECTION 76%
200.0000 mL | Freq: Once | INTRAVENOUS | Status: AC | PRN
  Administered 2023-06-02: 75 mL via INTRAVENOUS

## 2023-06-03 ENCOUNTER — Ambulatory Visit (INDEPENDENT_AMBULATORY_CARE_PROVIDER_SITE_OTHER): Payer: Medicare Other | Admitting: Surgery

## 2023-06-03 DIAGNOSIS — I712 Thoracic aortic aneurysm, without rupture, unspecified: Secondary | ICD-10-CM

## 2023-06-03 NOTE — Progress Notes (Addendum)
Patient ID: Colleen Parker, female   DOB: May 29, 1946, 77 y.o.   MRN: [id number]      [address]       [name] [address]             [phone]     CARDIOTHORACIC SURGERY TELEPHONE VIRTUAL OFFICE NOTE  Referring Provider is Robinson, [place] N, PA-C Primary Cardiologist is Little Ishikawa, MD PCP is Lula Olszewski, MD   HPI:  I spoke with Colleen Parker (DOB 1946-06-24 ) and her sister on conference via telephone on 06/03/2023 at 3:41 PM and verified that I was speaking with the correct person using more than one form of identification.  We discussed the fact that I was contacting them from my office and they were located at home, as well as the reason(s) for conducting our visit virtually instead of in-person.  The patient expressed understanding the circumstances and agreed to proceed as described.   The patient is a 77 year old woman who I first evaluated in February 2023 with a stable 5.2 to 5.3 cm fusiform ascending aortic aneurysm that extended out into the aortic arch.  Her descending thoracic aorta at that time measured 3 cm.  She had a trileaflet aortic valve.  She had been followed previously by Dr. Dorris Fetch and came to see me as a second opinion.  When I saw her she had recently suffered spinal compression fractures after a fall and had poor mobility and malnutrition with an albumin of 2.5.  Her aneurysm was still below the surgical threshold by a few millimeters and she was not felt to be a good surgical candidate and continued follow-up was recommended.  When I last saw her on 05/20/2022 she was still having problems with mobility and using a walker.  She had a recent fall after hot shower.  Since last year she has continued to have problems with mobility related to her spinal compression fractures.  She has been followed by neurology for dementia.  She had a carotid duplex which showed a 40 to 59% left carotid stenosis that is being managed medically.  She was  hospitalized in September 2023 with urosepsis.   Current Outpatient Medications  Medication Sig Dispense Refill   acetaminophen (TYLENOL) 500 MG tablet Take 1,000 mg by mouth as needed for mild pain or headache.     albuterol (VENTOLIN HFA) 108 (90 Base) MCG/ACT inhaler Inhale 2 puffs into the lungs 3 (three) times daily as needed for wheezing or shortness of breath.     alendronate (FOSAMAX) 70 MG tablet Take 70 mg by mouth once a week.     aspirin EC 81 MG tablet Take 81 mg by mouth daily. Swallow whole.     AZO-CRANBERRY PO Take 1 tablet by mouth in the morning and at bedtime.     cholecalciferol (VITAMIN D3) 25 MCG (1000 UNIT) tablet Take 1,000 Units by mouth daily.     Cyanocobalamin (VITAMIN B-12) 1000 MCG SUBL Place 1 tablet under the tongue daily.     loratadine (CLARITIN) 10 MG tablet Take 10 mg by mouth daily as needed for allergies.     meloxicam (MOBIC) 15 MG tablet Take 15 mg by mouth as needed for pain.     memantine (NAMENDA) 10 MG tablet Take 1 tablet (10 mg total) by mouth 2 (two) times daily. 180 tablet 3   Probiotic Product (CULTURELLE PROBIOTICS) CHEW Chew 1 tablet by mouth daily. 30 tablet 0   rosuvastatin (CRESTOR) 10  MG tablet Take 1 tablet (10 mg total) by mouth daily. 90 tablet 3   sennosides-docusate sodium (SENOKOT-S) 8.6-50 MG tablet Take 1 tablet by mouth daily. 30 tablet 0   No current facility-administered medications for this visit.     Diagnostic Tests:  Narrative & Impression  CLINICAL DATA:  Aortic aneurysm.   EXAM: CT ANGIOGRAPHY CHEST WITH CONTRAST   TECHNIQUE: Multidetector CT imaging of the chest was performed using the standard protocol during bolus administration of intravenous contrast. Multiplanar CT image reconstructions and MIPs were obtained to evaluate the vascular anatomy.   RADIATION DOSE REDUCTION: This exam was performed according to the departmental dose-optimization program which includes automated exposure control,  adjustment of the mA and/or kV according to patient size and/or use of iterative reconstruction technique.   CONTRAST:  75mL ISOVUE-370 IOPAMIDOL (ISOVUE-370) INJECTION 76%   COMPARISON:  Chest CT dated 05/20/2022.   FINDINGS: Cardiovascular: There is no cardiomegaly or pericardial effusion. Fusiform aneurysmal dilatation of the ascending aorta measuring up to 5.5 cm in caliber cough similar or slightly progressed since the prior CT. No aortic dissection. There is mild atherosclerotic calcification of the thoracic aorta. The origins of the great vessels of the aortic arch appear patent as visualized.   Mediastinum/Nodes: No hilar or mediastinal adenopathy. Calcified hilar granuloma. There is a small hiatal hernia. Mild thickened appearance of the distal esophagus may represent esophagitis related to reflux. Small amount of fluid and ingested content in the esophagus may represent reflux or delayed clearance. No mediastinal fluid collection.   Lungs/Pleura: Linear atelectasis/scarring of the left lung base and lingula. Small right lower lobe calcified granuloma. No focal consolidation, pleural effusion, or pneumothorax. The central airways are patent.   Upper Abdomen: No acute abnormality.   Musculoskeletal: Osteopenia with degenerative changes and scoliosis. Old lower thoracic compression fractures. No acute osseous pathology.   Review of the MIP images confirms the above findings.   IMPRESSION: 1. Fusiform aneurysmal dilatation of the ascending aorta measuring up to 5.5 cm in caliber. Cardiothoracic surgery consultation recommended due to increased risk of rupture for arch aneurysm ? 5.5 cm. This recommendation follows 2010 ACCF/AHA/AATS/ACR/ASA/SCA/SCAI/SIR/STS/SVM Guidelines for the Diagnosis and Management of Patients With Thoracic Aortic Disease. Circulation. 2010; 121: Z610-R604. Aortic aneurysm NOS (ICD10-I71.9) 2. Small hiatal hernia with reflux and possible  esophagitis. Clinical correlation is recommended. 3.  Aortic Atherosclerosis (ICD10-I70.0).     Electronically Signed   By: Elgie Collard M.D.   On: 06/02/2023 17:06       Impression:  I have personally reviewed her CTA images and had measured her ascending aortic aneurysm at 5.5 cm.  Comparing this to her previous CT scans dating back to 2022 I think there is been slight enlargement.  Her previous scans have shown a measurement from 4.9 to 5.4 cm and have been up and down.  I do not think she would be a good surgical candidate given the combination of her age, dementia, and reduced mobility and debilitation due to her spinal compression fractures.  I discussed this with her and her family and they understand.  I discussed the importance of continued good blood pressure control preventing further enlargement of the aneurysm and acute aortic dissection.   Plan:  I will see her back in 1 year with a CTA of the chest for aortic surveillance.   I spent 10 minutes on the phone with Colleen Parker and her family discussing the results of her CTA scan, recommended followup and answering questions.  Alleen Borne, MD 06/03/2023 3:41 PM

## 2023-06-18 ENCOUNTER — Other Ambulatory Visit: Payer: Self-pay | Admitting: *Deleted

## 2023-06-18 DIAGNOSIS — I6523 Occlusion and stenosis of bilateral carotid arteries: Secondary | ICD-10-CM

## 2023-06-22 ENCOUNTER — Telehealth: Payer: Self-pay | Admitting: Physician Assistant

## 2023-06-22 ENCOUNTER — Ambulatory Visit: Payer: Medicare Other | Admitting: Podiatry

## 2023-06-22 NOTE — Telephone Encounter (Signed)
Called patients sister and set up Virtual appointment

## 2023-06-22 NOTE — Telephone Encounter (Signed)
Patient's sister called stating that the patient is having difficulty moving her legs, sister thinks she is going down hill with her health. Would like a call back soon as possible

## 2023-06-22 NOTE — Telephone Encounter (Signed)
Called patients sister and asked some questions on what is going on with the patiens leg. Patients one leg is still good and strong but her leg that is folded in is not useable at all nonweight bearing. Patient had a UTI but just finished the antibotics . Patient also having more trouble eating and having to mash up her food so she doesn't choke,. Patient also drooling more and unable to control the saliva any longer. Patients sister just feels there has been a big decline in the last couple of months and was hoping to come in a little earlier to be seen

## 2023-06-23 ENCOUNTER — Telehealth (INDEPENDENT_AMBULATORY_CARE_PROVIDER_SITE_OTHER): Payer: Medicare Other | Admitting: Physician Assistant

## 2023-06-23 DIAGNOSIS — R29898 Other symptoms and signs involving the musculoskeletal system: Secondary | ICD-10-CM

## 2023-06-23 DIAGNOSIS — G3101 Pick's disease: Secondary | ICD-10-CM | POA: Diagnosis not present

## 2023-06-23 DIAGNOSIS — R260 Ataxic gait: Secondary | ICD-10-CM

## 2023-06-23 DIAGNOSIS — F0150 Vascular dementia without behavioral disturbance: Secondary | ICD-10-CM

## 2023-06-23 DIAGNOSIS — R413 Other amnesia: Secondary | ICD-10-CM

## 2023-06-23 DIAGNOSIS — F0280 Dementia in other diseases classified elsewhere without behavioral disturbance: Secondary | ICD-10-CM

## 2023-06-23 NOTE — Progress Notes (Addendum)
Virtual Visit via Video Note The purpose of this virtual visit is to provide medical care in a patient that is unable to be seen in person due to physical or health limitations   Consent was obtained for video visit:  yes  Answered questions that patient had about telehealth interaction:  yes I discussed the limitations, risks, security and privacy concerns of performing an evaluation and management service by telemedicine. I also discussed with the patient that there may be a patient responsible charge related to this service. The patient expressed understanding and agreed to proceed.  Pt location: Home Physician Location: office Name of referring provider:  Lula Olszewski, MD I connected with Colleen Parker at patients initiation/request on 06/23/2023 at  9:00 AM EDT by video enabled telemedicine application and verified that I am speaking with the correct person using two identifiers. Pt MRN:  [id number] Pt DOB:  [birth year] Video Participants:  Colleen Parker;  daughter and caregiver.     Assessment and Plan:    Colleen Parker  is a 77 y.o. 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 Patient is currently on memantine 10 mg bid . She was last seen on 01/22/23.  For the last few weeks, daughter reports that she is more ataxic on the RLE, and may have some apraxia on B UE. Daughter is concerned about these changes in balance. She is on baby ASA daily. Cognitive decline is noted, but physical decline is more evident.    Recommendations  PT  for strength and deconditioning  MRI brain to evaluate for structural and vascular abnormalities. Daughter instructed to go to ED if stroke like symptoms appear.  Continue Memantine  10 mg twice daily. Side effects were discussed   Recommend good control of cardiovascular risk factors.   Continue to control mood as per PCP Return as scheduled.    History of Present Illness:      Any changes in memory? There is cognitive decline. 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 She cannot convey what she wants to say.   Disoriented ? Denies   Ambulates with difficulty? Needs a walker to ambulate but "things don't work", she has RLE weakness when walking, "Like she has to tell the leg what to do". No recent falls or head injuries, sometimes she may have apraxia "shakes really bad when trying to reach something". No LOC. No seizures.   Any mood changes? Agitation ? Denies  Hallucinations? Denies  Paranoia? Denies   sleeps well? Sleeps well  without vivid dreams, REM behavior or sleepwalking     Any hygiene concerns?  Does the patient needs help with medications? Daughter and caregiver is in charge  Any changes in appetite? Less appetite, may drool more at times.  Patient have trouble swallowing? Any headaches?Denies  Vision changes? Denies   Any pain? Denies  Any incontinence of urine? Endorsed, wears pullups Any bowel dysfunction? Denies     Initial visit 03/03/22  How long did patient have memory difficulties? 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  Then she began having difficulties with short-term memory,  and for the last year, she has demonstrated difficulties with speech, "knowing what I want to say, but it does not come out of my mouth ".  She continues to enjoy reading, watching TV and music. Patient lives with: sister and has an aide coming for 8 hrs repeats oneself? Denies  Disoriented when walking into a room?  Patient denies except "always getting confused when trying to find a bathroom " leaving objects in unusual  places?  Patient denies   Ambulates  with difficulty?   Patient denies, but uses a walker for stability.   "Walks a lot" Recent falls?  Patient denies   Any head injuries?  She has a history of concussion of the forehead on 11/13/2021, and on 07/22/2020 after a fall after UTI and Requiring hospitalization history of seizures?   Patient denies   Wandering behavior?  Patient denies   Patient drives?   Patient no longer drives. Stopped after the head injury  Any mood changes such irritability agitation?  Patient denies  "She is sweet" Any history of depression?:  Patient denies   Hallucinations?  Patient denies   Paranoia?  Patient denies   Patient reports that he sleeps well without vivid dreams, REM behavior or sleepwalking    History of sleep apnea?  Patient denies   Any hygiene concerns?  Patient denies   Independent of bathing and dressing?  Endorsed  Does the patient needs help with medications?  Caregiver in charge Who is in charge of the finances? Sister Colleen Parker is in charge  Any changes in appetite?  Patient denies   Patient have trouble swallowing? Patient denies   Does the patient cook?  Patient cooks occasionally  Any kitchen accidents such as leaving the stove on? Patient denies   Any headaches?  Patient denies   The double vision? Patient denies   Any focal numbness or tingling?  Patient denies   Chronic back pain Patient denies   Unilateral weakness?  Patient denies   Any tremors?  Patient denies   Any history of anosmia?  Patient denies   Any incontinence of urine?  "Only at night may need a diaper ".  She had a recent UTI and requiring hospitalization in February 2023 Any bowel dysfunction?   Patient denies   History of heavy alcohol intake?  Patient denies   History of heavy tobacco use?  Patient denies   Family history of dementia?  Strong family history of Alzheimer's disease , Mo, maternal aunt, maternal first cousin Engineer, maintenance (IT) (valedictorian), retired  Airline pilot Current Outpatient Medications on File Prior to Visit  Medication Sig Dispense Refill   acetaminophen (TYLENOL) 500 MG tablet Take 1,000 mg by mouth as needed for mild pain or headache.     albuterol (VENTOLIN HFA) 108 (90 Base) MCG/ACT inhaler Inhale 2 puffs into the lungs 3 (three) times daily as needed for wheezing or shortness of breath.     alendronate (FOSAMAX) 70 MG tablet Take 70 mg by mouth once a week.     aspirin EC 81 MG tablet Take 81 mg by mouth daily. Swallow whole.     AZO-CRANBERRY PO Take 1 tablet by mouth in the morning and at bedtime.     cholecalciferol (VITAMIN D3) 25 MCG (1000 UNIT) tablet Take 1,000 Units by mouth daily.     Cyanocobalamin (VITAMIN B-12) 1000 MCG SUBL Place 1 tablet under the tongue daily.     loratadine (CLARITIN) 10 MG tablet Take 10 mg by mouth daily as  needed for allergies.     meloxicam (MOBIC) 15 MG tablet Take 15 mg by mouth as needed for pain.     memantine (NAMENDA) 10 MG tablet Take 1 tablet (10 mg total) by mouth 2 (two) times daily. 180 tablet 3   Probiotic Product (CULTURELLE PROBIOTICS) CHEW Chew 1 tablet by mouth daily. 30 tablet 0   rosuvastatin (CRESTOR) 10 MG tablet Take 1 tablet (10 mg total) by mouth daily. 90 tablet 3   sennosides-docusate sodium (SENOKOT-S) 8.6-50 MG tablet Take 1 tablet by mouth daily. 30 tablet 0   No current facility-administered medications on file prior to visit.     Observations/Objective:   There were no vitals filed for this visit. GEN:  The patient appears stated age and is in NAD.  Neurological examination: Patient is awake, alert, oriented to person, not to place or date. Noted aphasia, no dysarthria. Decreased  comprehension. Remote and recent memory impaired. Unable to repeat phrases. Cranial nerves: Extraocular movements reduced,  with no nystagmus. No facial asymmetry. Motor: moves all extremities symmetrically, at least anti-gravity x 4. However, apraxia noted on upper extremities, RLE  ataxia. + incoordination on finger to nose testing . Gait: narrow-based and steady, uses the walker to ambulate       Follow Up Instructions:    -I discussed the assessment and treatment plan with the patient. The patient was provided an opportunity to ask questions and all were answered. The patient agreed with the plan and demonstrated an understanding of the instructions.   The patient was advised to call back or seek an in-person evaluation if the symptoms worsen or if the condition fails to improve as anticipated.    Total time spent on today's visit was , including both face-to-face time and nonface-to-face time.  Time included that spent on review of records (prior notes available to me/labs/imaging if pertinent), discussing treatment and goals, answering patient's questions and coordinating care.   Marlowe Kays, PA-C

## 2023-06-23 NOTE — Patient Instructions (Signed)
It was a pleasure to see you today at our office.   Recommendations:  Continue memantine 10 mg  two times a day   MRI brain with and without contrast  Referral to PT for strength and deconditioning  Continue aspirin daily  Follow up as scheduled Nov 4 at 3 pm  Whom to call:  Memory  decline, memory medications: Call our office [phone]   For psychiatric meds, mood meds: Please have your primary care physician manage these medications.   Counseling regarding caregiver distress, including caregiver depression, anxiety and issues regarding community resources, adult day care programs, adult living facilities, or memory care questions:   Feel free to contact Misty Lisabeth Register, Social Worker at [phone]   For assessment of decision of mental capacity and competency:  Call Dr. Erick Blinks, geriatric psychiatrist at [phone]  For guidance in geriatric dementia issues please call [facility] [phone]   If you have any severe symptoms of a stroke, or other severe issues such as confusion,severe chills or fever, etc call 911 or go to the ER as you may need to be evaluated further      RECOMMENDATIONS FOR ALL PATIENTS WITH MEMORY PROBLEMS: 1. Continue to exercise (Recommend 30 minutes of walking everyday, or 3 hours every week) 2. Increase social interactions - continue going to [place] and enjoy social gatherings with friends and family 3. Eat healthy, avoid fried foods and eat more fruits and vegetables 4. Maintain adequate blood pressure, blood sugar, and blood cholesterol level. Reducing the risk of stroke and cardiovascular disease also helps promoting better memory. 5. Avoid stressful situations. Live a simple life and avoid aggravations. Organize your time and prepare for the next day in anticipation. 6. Sleep well, avoid any interruptions of sleep and avoid any distractions in the bedroom that may interfere with adequate sleep quality 7. Avoid sugar,  avoid sweets as there is a strong link between excessive sugar intake, diabetes, and cognitive impairment We discussed the Mediterranean diet, which has been shown to help patients reduce the risk of progressive memory disorders and reduces cardiovascular risk. This includes eating fish, eat fruits and green leafy vegetables, nuts like almonds and hazelnuts, walnuts, and also use olive oil. Avoid fast foods and fried foods as much as possible. Avoid sweets and sugar as sugar use has been linked to worsening of memory function.  There is always a concern of gradual progression of memory problems. If this is the case, then we may need to adjust level of care according to patient needs. Support, both to the patient and caregiver, should then be put into place.    FALL PRECAUTIONS: Be cautious when walking. Scan the area for obstacles that may increase the risk of trips and falls. When getting up in the mornings, sit up at the edge of the bed for a few minutes before getting out of bed. Consider elevating the bed at the head end to avoid drop of blood pressure when getting up. Walk always in a well-lit room (use night lights in the walls). Avoid area rugs or power cords from appliances in the middle of the walkways. Use a walker or a cane if necessary and consider physical therapy for balance exercise. Get your eyesight checked regularly.  FINANCIAL OVERSIGHT: Supervision, especially oversight when making financial decisions or transactions is also recommended.  HOME SAFETY: Consider the safety of the kitchen when operating appliances like stoves, microwave oven, and blender. Consider having supervision and share cooking responsibilities until  no longer able to participate in those. Accidents with firearms and other hazards in the house should be identified and addressed as well.   ABILITY TO BE LEFT ALONE: If patient is unable to contact 911 operator, consider using LifeLine, or when the need is there,  arrange for someone to stay with patients. Smoking is a fire hazard, consider supervision or cessation. Risk of wandering should be assessed by caregiver and if detected at any point, supervision and safe proof recommendations should be instituted.  MEDICATION SUPERVISION: Inability to self-administer medication needs to be constantly addressed. Implement a mechanism to ensure safe administration of the medications.     Mediterranean Diet A Mediterranean diet refers to food and lifestyle choices that are based on the traditions of countries located on the [employer] [place]. This way of eating has been shown to help prevent certain conditions and improve outcomes for people who have chronic diseases, like kidney disease and heart disease. What are tips for following this plan? Lifestyle  Cook and eat meals together with your family, when possible. Drink enough fluid to keep your urine clear or pale yellow. Be physically active every day. This includes: Aerobic exercise like running or swimming. Leisure activities like gardening, walking, or housework. Get 7-8 hours of sleep each night. If recommended by your health care provider, drink red wine in moderation. This means 1 glass a day for nonpregnant women and 2 glasses a day for men. A glass of wine equals 5 oz (150 mL). Reading food labels  Check the serving size of packaged foods. For foods such as rice and pasta, the serving size refers to the amount of cooked product, not dry. Check the total fat in packaged foods. Avoid foods that have saturated fat or trans fats. Check the ingredients list for added sugars, such as corn syrup. Shopping  At the grocery store, buy most of your food from the areas near the walls of the store. This includes: Fresh fruits and vegetables (produce). Grains, beans, nuts, and seeds. Some of these may be available in unpackaged forms or large amounts (in bulk). Fresh seafood. Poultry and eggs. Low-fat dairy  products. Buy whole ingredients instead of prepackaged foods. Buy fresh fruits and vegetables in-season from local farmers markets. Buy frozen fruits and vegetables in resealable bags. If you do not have access to quality fresh seafood, buy precooked frozen shrimp or canned fish, such as tuna, salmon, or sardines. Buy small amounts of raw or cooked vegetables, salads, or olives from the deli or salad bar at your store. Stock your pantry so you always have certain foods on hand, such as olive oil, canned tuna, canned tomatoes, rice, pasta, and beans. Cooking  Cook foods with extra-virgin olive oil instead of using butter or other vegetable oils. Have meat as a side dish, and have vegetables or grains as your main dish. This means having meat in small portions or adding small amounts of meat to foods like pasta or stew. Use beans or vegetables instead of meat in common dishes like chili or lasagna. Experiment with different cooking methods. Try roasting or broiling vegetables instead of steaming or sauteing them. Add frozen vegetables to soups, stews, pasta, or rice. Add nuts or seeds for added healthy fat at each meal. You can add these to yogurt, salads, or vegetable dishes. Marinate fish or vegetables using olive oil, lemon juice, garlic, and fresh herbs. Meal planning  Plan to eat 1 vegetarian meal one day each week. Try to work up  to 2 vegetarian meals, if possible. Eat seafood 2 or more times a week. Have healthy snacks readily available, such as: Vegetable sticks with hummus. Greek yogurt. Fruit and nut trail mix. Eat balanced meals throughout the week. This includes: Fruit: 2-3 servings a day Vegetables: 4-5 servings a day Low-fat dairy: 2 servings a day Fish, poultry, or lean meat: 1 serving a day Beans and legumes: 2 or more servings a week Nuts and seeds: 1-2 servings a day Whole grains: 6-8 servings a day Extra-virgin olive oil: 3-4 servings a day Limit red meat and sweets  to only a few servings a month What are my food choices? Mediterranean diet Recommended Grains: Whole-grain pasta. Brown rice. Bulgar wheat. Polenta. Couscous. Whole-wheat bread. Orpah Cobb. Vegetables: Artichokes. Beets. Broccoli. Cabbage. Carrots. Eggplant. Green beans. Chard. Kale. Spinach. Onions. Leeks. Peas. Squash. Tomatoes. Peppers. Radishes. Fruits: Apples. Apricots. Avocado. Berries. Bananas. Cherries. Dates. Figs. Grapes. Lemons. Melon. Oranges. Peaches. Plums. Pomegranate. Meats and other protein foods: Beans. Almonds. Sunflower seeds. Pine nuts. Peanuts. Cod. Salmon. Scallops. Shrimp. Tuna. Tilapia. Clams. Oysters. Eggs. Dairy: Low-fat milk. Cheese. Greek yogurt. Beverages: Water. Red wine. Herbal tea. Fats and oils: Extra virgin olive oil. Avocado oil. Grape seed oil. Sweets and desserts: [place] yogurt with honey. Baked apples. Poached pears. Trail mix. Seasoning and other foods: Basil. Cilantro. Coriander. Cumin. Mint. Parsley. Sage. Rosemary. Tarragon. Garlic. Oregano. Thyme. Pepper. Balsalmic vinegar. Tahini. Hummus. Tomato sauce. Olives. Mushrooms. Limit these Grains: Prepackaged pasta or rice dishes. Prepackaged cereal with added sugar. Vegetables: Deep fried potatoes (french fries). Fruits: Fruit canned in syrup. Meats and other protein foods: Beef. Pork. Lamb. Poultry with skin. Hot dogs. Tomasa Blase. Dairy: Ice cream. Sour cream. Whole milk. Beverages: Juice. Sugar-sweetened soft drinks. Beer. Liquor and spirits. Fats and oils: Butter. Canola oil. Vegetable oil. Beef fat (tallow). Lard. Sweets and desserts: Cookies. Cakes. Pies. Candy. Seasoning and other foods: Mayonnaise. Premade sauces and marinades. The items listed may not be a complete list. Talk with your dietitian about what dietary choices are right for you. Summary The Mediterranean diet includes both food and lifestyle choices. Eat a variety of fresh fruits and vegetables, beans, nuts, seeds, and whole  grains. Limit the amount of red meat and sweets that you eat. Talk with your health care provider about whether it is safe for you to drink red wine in moderation. This means 1 glass a day for nonpregnant women and 2 glasses a day for men. A glass of wine equals 5 oz (150 mL). This information is not intended to replace advice given to you by your health care provider. Make sure you discuss any questions you have with your health care provider. Document Released: 04/30/2016 Document Revised: 06/02/2016 Document Reviewed: 04/30/2016 Elsevier Interactive Patient Education  2017 [employer].   We have sent a referral to [facility] for your MRI and they will call you directly to schedule your appointment. They are located at [address] [facility]. If you need to contact them directly please call [phone].

## 2023-06-24 ENCOUNTER — Telehealth: Payer: Self-pay | Admitting: Physician Assistant

## 2023-06-24 DIAGNOSIS — R260 Ataxic gait: Secondary | ICD-10-CM

## 2023-06-24 DIAGNOSIS — R413 Other amnesia: Secondary | ICD-10-CM

## 2023-06-24 DIAGNOSIS — R29898 Other symptoms and signs involving the musculoskeletal system: Secondary | ICD-10-CM

## 2023-06-24 DIAGNOSIS — F0280 Dementia in other diseases classified elsewhere without behavioral disturbance: Secondary | ICD-10-CM

## 2023-06-24 NOTE — Telephone Encounter (Signed)
Pt's sister called in stating [facility] won't do her imaging because she cannot get on the table by herself. They are suggesting [facility]. Can a referral be sent there?

## 2023-06-28 NOTE — Telephone Encounter (Signed)
Sent to [facility], advised and thanked me for calling. Ordered MRI brain with and wihtout

## 2023-06-28 NOTE — Progress Notes (Unsigned)
[facility] [place]  ASSESSMENT / PLAN: Colleen Parker is a 77 y.o. female with asymptomatic bilateral (left 40 to 59%, right 1 to 39%)  The patient should continue best medical therapy for carotid artery stenosis including: Complete cessation from all tobacco products. Blood glucose control with goal A1c < 7%. Blood pressure control with goal blood pressure < 140/90 mmHg. Lipid reduction therapy with goal LDL-C <100 mg/dL (<16 if symptomatic from carotid artery stenosis).  Aspirin 81mg  PO QD.  Atorvastatin 40-80mg  PO QD (or other "high intensity" statin therapy).  No need for revascularization of left carotid lesion, She is not a good surgical candidate.  Follow-up with me in 1 year with repeat carotid duplex.  CHIEF COMPLAINT: Incidental discovery of carotid artery stenosis  HISTORY OF PRESENT ILLNESS: Colleen Parker is a 77 y.o. female referred to clinic for evaluation of carotid artery stenosis demonstrated on CT angiogram of the chest done for ascending aortic aneurysm.  She has been under the care of Dr. [name] for the same.  Patient is asymptomatic from a carotid artery standpoint.  She denies any unilateral focal symptoms including amaurosis, facial droop, unilateral weakness, dysarthria.  Past Medical History:  Diagnosis Date   Chronic headaches    Menopausal state    Migraines    Recurrent sinus infections    Vascular dementia without behavioral disturbance, psychotic disturbance, mood disturbance, or anxiety (HCC)     Past Surgical History:  Procedure Laterality Date   DILATION AND CURETTAGE OF UTERUS     abnl cells   TONSILLECTOMY  1956    Family History  Problem Relation Age of Onset   Alzheimer's disease Mother    Heart attack Mother    Heart attack Father    Heart disease Brother     Social History   Socioeconomic History   Marital status: Widowed    Spouse name: Not on file   Number of children: 0   Years of education: BA    Highest education level: Bachelor's degree (e.g., BA, AB, BS)  Occupational History   Occupation: Retired     Comment: Runner, broadcasting/film/video retired  Tobacco Use   Smoking status: Never   Smokeless tobacco: Never  Substance and Sexual Activity   Alcohol use: Not Currently    Comment: ocass   Drug use: Never   Sexual activity: Not on file  Other Topics Concern   Not on file  Social History Narrative   Lives at home and younger sister lives with her    Caffeine use: coffee  And tea sometimes   Right handed   Social Determinants of Health   Financial Resource Strain: Not on file  Food Insecurity: No Food Insecurity (06/14/2022)   Hunger Vital Sign    Worried About Running Out of Food in the Last Year: Never true    Ran Out of Food in the Last Year: Never true  Transportation Needs: Not on file  Physical Activity: Not on file  Stress: Not on file  Social Connections: Unknown (03/12/2022)   Received from [facility], [facility]   Social Network    Social Network: Not on file  Intimate Partner Violence: Unknown (03/12/2022)   Received from [facility], [facility]   HITS    Physically Hurt: Not on file    Insult or Talk Down To: Not on file    Threaten Physical Harm: Not on file    Scream or Curse: Not on file  Allergies  Allergen Reactions   Penicillins Shortness Of Breath   Quinolones Other (See Comments)    Aneurysm   Sulfamethoxazole Hives and Swelling    Current Outpatient Medications  Medication Sig Dispense Refill   acetaminophen (TYLENOL) 500 MG tablet Take 1,000 mg by mouth as needed for mild pain or headache.     albuterol (VENTOLIN HFA) 108 (90 Base) MCG/ACT inhaler Inhale 2 puffs into the lungs 3 (three) times daily as needed for wheezing or shortness of breath.     alendronate (FOSAMAX) 70 MG tablet Take 70 mg by mouth once a week.     aspirin EC 81 MG tablet Take 81 mg by mouth daily. Swallow whole.     AZO-CRANBERRY PO Take 1 tablet by mouth in the morning  and at bedtime.     cholecalciferol (VITAMIN D3) 25 MCG (1000 UNIT) tablet Take 1,000 Units by mouth daily.     Cyanocobalamin (VITAMIN B-12) 1000 MCG SUBL Place 1 tablet under the tongue daily.     loratadine (CLARITIN) 10 MG tablet Take 10 mg by mouth daily as needed for allergies.     meloxicam (MOBIC) 15 MG tablet Take 15 mg by mouth as needed for pain.     memantine (NAMENDA) 10 MG tablet Take 1 tablet (10 mg total) by mouth 2 (two) times daily. 180 tablet 3   Probiotic Product (CULTURELLE PROBIOTICS) CHEW Chew 1 tablet by mouth daily. 30 tablet 0   rosuvastatin (CRESTOR) 10 MG tablet Take 1 tablet (10 mg total) by mouth daily. 90 tablet 3   sennosides-docusate sodium (SENOKOT-S) 8.6-50 MG tablet Take 1 tablet by mouth daily. 30 tablet 0   No current facility-administered medications for this visit.    PHYSICAL EXAM There were no vitals filed for this visit.   Frail elderly woman in no acute distress Regular rate and rhythm Unlabored breathing No focal neurologic signs   PERTINENT LABORATORY AND RADIOLOGIC DATA  Most recent CBC    Latest Ref Rng & Units 06/13/2022   12:40 AM 06/12/2022    5:37 AM 11/17/2021    6:17 AM  CBC  WBC 4.0 - 10.5 K/uL 11.7  17.0  11.7   Hemoglobin 12.0 - 15.0 g/dL 32.4  40.1  02.7   Hematocrit 36.0 - 46.0 % 31.0  38.6  36.6   Platelets 150 - 400 K/uL 292  321  493      Most recent CMP    Latest Ref Rng & Units 06/13/2022   12:40 AM 06/12/2022    5:37 AM 11/17/2021    6:17 AM  CMP  Glucose 70 - 99 mg/dL 253  97  664   BUN 8 - 23 mg/dL 13  18  6    Creatinine 0.44 - 1.00 mg/dL 4.03  4.74  2.59   Sodium 135 - 145 mmol/L 141  140  138   Potassium 3.5 - 5.1 mmol/L 3.6  4.0  4.2   Chloride 98 - 111 mmol/L 111  106  106   CO2 22 - 32 mmol/L 18  24  24    Calcium 8.9 - 10.3 mg/dL 9.1  56.3  9.4   Total Protein 6.5 - 8.1 g/dL  7.5    Total Bilirubin 0.3 - 1.2 mg/dL  0.5    Alkaline Phos 38 - 126 U/L  71    AST 15 - 41 U/L  25    ALT 0 - 44 U/L   18  Renal function CrCl cannot be calculated (Patient's most recent lab result is older than the maximum 21 days allowed.).  No results found for: "HGBA1C"  LDL Chol Calc (NIH)  Date Value Ref Range Status  09/17/2022 63 0 - 99 mg/dL Final    Carotid duplex Right Carotid: Velocities in the right ICA are consistent with a 1-39%  stenosis.   Left Carotid: Velocities in the left ICA are consistent with a 40-59%  stenosis.   Vertebrals:  Bilateral vertebral arteries demonstrate antegrade flow.  Subclavians: Normal flow hemodynamics were seen in bilateral subclavian               arteries.   Rande Brunt. Lenell Antu, MD [facility] Phone Number: [phone] 06/28/2023 11:40 AM  Total time spent on preparing this encounter including chart review, data review, collecting history, examining the patient, coordinating care for this new patient, 60 minutes.  Portions of this report may have been transcribed using voice recognition software.  Every effort has been made to ensure accuracy; however, inadvertent computerized transcription errors may still be present.

## 2023-06-28 NOTE — Telephone Encounter (Signed)
I will place at [facility] shortly

## 2023-06-29 ENCOUNTER — Ambulatory Visit (HOSPITAL_COMMUNITY)
Admission: RE | Admit: 2023-06-29 | Discharge: 2023-06-29 | Disposition: A | Discharge status: Home / Self Care | Payer: Medicare Other | Source: Ambulatory Visit | Attending: Vascular Surgery | Admitting: Vascular Surgery

## 2023-06-29 ENCOUNTER — Encounter: Payer: Self-pay | Admitting: Vascular Surgery

## 2023-06-29 ENCOUNTER — Ambulatory Visit (INDEPENDENT_AMBULATORY_CARE_PROVIDER_SITE_OTHER): Payer: Medicare Other | Admitting: Vascular Surgery

## 2023-06-29 VITALS — BP 104/67 | HR 104 | Temp 97.5°F | Ht 65.0 in | Wt 126.0 lb

## 2023-06-29 DIAGNOSIS — I6523 Occlusion and stenosis of bilateral carotid arteries: Secondary | ICD-10-CM

## 2023-07-20 DIAGNOSIS — R413 Other amnesia: Secondary | ICD-10-CM | POA: Insufficient documentation

## 2023-07-20 DIAGNOSIS — R260 Ataxic gait: Secondary | ICD-10-CM | POA: Insufficient documentation

## 2023-07-23 ENCOUNTER — Other Ambulatory Visit: Payer: Self-pay

## 2023-07-23 DIAGNOSIS — I6523 Occlusion and stenosis of bilateral carotid arteries: Secondary | ICD-10-CM

## 2023-07-26 ENCOUNTER — Ambulatory Visit (INDEPENDENT_AMBULATORY_CARE_PROVIDER_SITE_OTHER): Payer: Medicare Other | Admitting: Physician Assistant

## 2023-07-26 ENCOUNTER — Encounter: Payer: Self-pay | Admitting: Physician Assistant

## 2023-07-26 VITALS — BP 83/57 | HR 74 | Resp 20 | Ht 65.0 in

## 2023-07-26 DIAGNOSIS — F0280 Dementia in other diseases classified elsewhere without behavioral disturbance: Secondary | ICD-10-CM | POA: Diagnosis not present

## 2023-07-26 DIAGNOSIS — F0150 Vascular dementia without behavioral disturbance: Secondary | ICD-10-CM

## 2023-07-26 DIAGNOSIS — G3101 Pick's disease: Secondary | ICD-10-CM

## 2023-07-26 DIAGNOSIS — R260 Ataxic gait: Secondary | ICD-10-CM

## 2023-07-26 MED ORDER — MEMANTINE HCL 10 MG PO TABS: 10.0000 mg | ORAL_TABLET | Freq: Two times a day (BID) | ORAL | 3 refills | Status: AC

## 2023-07-26 NOTE — Progress Notes (Signed)
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 RH female with a history of hypertension, hyperlipidemia, QT prolongation, history of traumatic hematoma of the head seen today in follow up for memory loss. Patient is currently on memantine 10 mg bid . She has been recently seen via video visit due to significant physical decline, and was unable to come in person.  At the time, she was noted to be more ataxic on the right lower extremity, with some apraxia on both upper extremities.  These changes  caused difficulty with balance.  Cognitive decline is noted as well, especially with speech. MRI brain results (from the report, no image available for review), remarkable for worsening chronic microvascular changes, and atrophy. Ventriculomegaly was reported, but she does not have frank symptoms of NPH.  She does not have magnetic feet, in fact, she has been doing physical therapy which has helped her with mobility.  She also is doing speech therapy for PPA.  Of note, the patient has been experiencing some difficulty with swallowing, which is being evaluated by speech therapy.  Overall, progression of the disease is noted.    Follow up in 6 months. Continue memantine 10 mg twice daily, side effects discussed Continue ST and PT Recommend good control of her cardiovascular risk factors.  Patient informed of the low blood pressure readings today, recommend to go over the list of antihypertensives with her physician. Continue to control mood as per PCP Continue Palliative Care     Subjective:    This patient is accompanied in the office by her daughter and caregiver who supplements the history.  Previous records as well as any outside records available were reviewed prior to todays visit. Patient was last seen on 10-24 per video visit    Any changes in memory since last visit? "  There is cognitive decline".  She has  significant speech difficulty related to primary progressive aphasia.  She cannot combine what she wants to say. repeats oneself?  Denies  Disoriented when walking into a room?  Patient denies    Leaving objects? Denies   Wandering behavior?  denies   Any personality changes since last visit?  denies   Any worsening depression?:she has some moments of sadness related to aphasia and overall physical decline Hallucinations or paranoia?  Denies.   Seizures? denies    Any sleep changes?  She wakes up several times a night.  Denies vivid dreams, REM behavior or sleepwalking   Sleep apnea?   Denies.   Any hygiene concerns? Denies. Caregiver bathes her regularly  Independent of bathing and dressing?  She needs full assistance. Does the patient needs help with medications?  Daughter is in charge  Who is in charge of the finances?  Daughter is in charge    Any changes in appetite?  Appetite is decreased, sometimes she drools more than others.   Patient have trouble swallowing?  She has some trouble with swallowing at times.  ST to evaluate for swallowing issues  Does the patient cook? No Any headaches?   Denies.   Chronic back pain  denies.   Ambulates with difficulty?  "She needs a walker to ambulate, got better with therapy ". She has right lower extremity weakness when walking with therapy .  No falls or head injuries Recent falls or head injuries? denies     Unilateral weakness, numbness or tingling? denies   Any tremors?  Denies   Any anosmia?  Denies   Any incontinence of urine?  Endorsed, wears pull-ups. Any bowel dysfunction?   Denies      Patient lives with  daughter  Does the patient drive? No longer drives    Initial visit 03/03/22  How long did patient have memory difficulties? 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  Then she began having difficulties with short-term memory,  and for the last year, she has demonstrated difficulties with speech, "knowing what I want to say, but it does not come out of my mouth ".  She continues to enjoy reading, watching TV and music. Patient lives with: sister and has an aide coming for 8 hrs repeats oneself? Denies  Disoriented when walking into a room?  Patient denies except "always getting confused when trying to find a bathroom " leaving objects in unusual places?  Patient denies   Ambulates  with difficulty?   Patient denies, but uses a walker for stability.   "Walks a lot" Recent falls?  Patient denies   Any head injuries?  She has a history of concussion of the forehead on 11/13/2021, and on 07/22/2020 after a fall after UTI and Requiring hospitalization history of seizures?   Patient denies   Wandering behavior?  Patient denies   Patient drives?   Patient no longer drives. Stopped after the head injury  Any mood changes such irritability agitation?  Patient denies  "She is sweet" Any history of depression?:  Patient denies   Hallucinations?  Patient denies   Paranoia?  Patient denies   Patient reports that he sleeps well without vivid dreams, REM behavior or sleepwalking    History of sleep apnea?  Patient denies   Any hygiene concerns?  Patient denies   Independent of bathing and dressing?  Endorsed  Does the patient needs help with medications?  Caregiver in charge Who is in charge of the finances? Sister [name] is in charge  Any changes in appetite?  Patient denies   Patient have trouble swallowing? Patient denies   Does the patient cook?  Patient cooks occasionally  Any kitchen accidents such as leaving the stove on? Patient denies   Any headaches?  Patient denies   The double vision? Patient denies   Any focal numbness or tingling?  Patient denies   Chronic back pain Patient denies   Unilateral weakness?  Patient denies   Any tremors?  Patient denies   Any history of anosmia?  Patient denies   Any incontinence of  urine?  "Only at night may need a diaper ".  She had a recent UTI and requiring hospitalization in February 2023 Any bowel dysfunction?   Patient denies   History of heavy alcohol intake?  Patient denies   History of heavy tobacco use?  Patient denies   Family history of dementia?  Strong family history of Alzheimer's disease , Mo, maternal aunt, maternal first cousin Engineer, maintenance (IT) (valedictorian), retired Airline pilot  PREVIOUS MEDICATIONS:   CURRENT MEDICATIONS:  Outpatient Encounter Medications as of 07/26/2023  Medication Sig   acetaminophen (TYLENOL) 500 MG tablet Take 1,000 mg by mouth as needed for mild pain or headache.   albuterol (VENTOLIN HFA) 108 (90 Base) MCG/ACT inhaler Inhale 2 puffs into the lungs 3 (three) times daily as needed for wheezing or shortness of breath.   alendronate (FOSAMAX) 70 MG tablet Take 70 mg  by mouth once a week.   aspirin EC 81 MG tablet Take 81 mg by mouth daily. Swallow whole.   AZO-CRANBERRY PO Take 1 tablet by mouth in the morning and at bedtime.   cholecalciferol (VITAMIN D3) 25 MCG (1000 UNIT) tablet Take 1,000 Units by mouth daily.   Cyanocobalamin (VITAMIN B-12) 1000 MCG SUBL Place 1 tablet under the tongue daily.   loratadine (CLARITIN) 10 MG tablet Take 10 mg by mouth daily as needed for allergies.   meloxicam (MOBIC) 15 MG tablet Take 15 mg by mouth as needed for pain.   Probiotic Product (CULTURELLE PROBIOTICS) CHEW Chew 1 tablet by mouth daily.   rosuvastatin (CRESTOR) 10 MG tablet Take 1 tablet (10 mg total) by mouth daily.   sennosides-docusate sodium (SENOKOT-S) 8.6-50 MG tablet Take 1 tablet by mouth daily.   [DISCONTINUED] memantine (NAMENDA) 10 MG tablet Take 1 tablet (10 mg total) by mouth 2 (two) times daily.   memantine (NAMENDA) 10 MG tablet Take 1 tablet (10 mg total) by mouth 2 (two) times daily.   No facility-administered encounter medications on file as of 07/26/2023.        No data to display            02/27/2022     9:00 AM  Montreal Cognitive Assessment   Visuospatial/ Executive (0/5) 0  Naming (0/3) 1  Attention: Read list of digits (0/2) 2  Attention: Read list of letters (0/1) 0  Attention: Serial 7 subtraction starting at 100 (0/3) 0  Language: Repeat phrase (0/2) 0  Language : Fluency (0/1) 0  Abstraction (0/2) 0  Delayed Recall (0/5) 0  Orientation (0/6) 0  Total 3  Adjusted Score (based on education) 3    Objective:     PHYSICAL EXAMINATION:    VITALS:   Vitals:   07/26/23 1500  BP: (!) 83/57  Pulse: 74  Resp: 20  SpO2: 95%  Height: 5\' 5"  (1.651 m)    GEN:  The patient appears stated age and is in NAD. HEENT:  Normocephalic, atraumatic.   Neurological examination:  General: NAD, well-groomed, appears stated age. Orientation: The patient is alert. Oriented to person, she is unable to express date or place. Cranial nerves: There is good facial symmetry.The speech is not fluent, but clear.  There is aphasia no dysarthria, there is decreased comprehension. Fund of knowledge is reduced. Recent and remote memory are impaired. Attention and concentration are reduced.  Unable to name objects and repeat phrases due to aphasia.  Hearing is intact to conversational tone.  Sensation: Sensation is intact to light touch throughout Motor: Strength is at least antigravity x4. DTR's 2/4 in UE/LE     Movement examination: Tone: There is normal tone in the UE/LE Abnormal movements: Noted right lower extremity ataxia.  There is incoordination of finger-to-nose testing, and no tremor.  No myoclonus.  No asterixis.   Coordination:  There is decremation with RAM's.   Gait and Station: The patient has difficulty arising out of a deep-seated chair without the use of the hands. Needs to be held. Tried to stand up from wheelchair. No magnetic steps. The patient's stride length is short but able to lift her legs. Gait very cautious, narrow.  Thank you for allowing [place] the opportunity to participate  in the care of this nice patient. Please do not hesitate to contact us for any questions or concerns.   Total time spent on today's visit was 32 minutes dedicated to this patient today,  preparing to see patient, examining the patient, ordering tests and/or medications and counseling the patient, documenting clinical information in the EHR or other health record, independently interpreting results and communicating results to the patient/family, discussing treatment and goals, answering patient's questions and coordinating care.  Cc:  Lula Olszewski, MD  Marlowe Kays 07/26/2023 5:58 PM

## 2023-07-26 NOTE — Patient Instructions (Addendum)
It was a pleasure to see you today at our office.   Recommendations:  Continue memantine 10 mg  two times a day  Referral to PT for strength and deconditioning  and ST  Continue aspirin daily  Follow up in 6 months   Whom to call:  Memory  decline, memory medications: Call our office [phone]   For psychiatric meds, mood meds: Please have your primary care physician manage these medications.   Counseling regarding caregiver distress, including caregiver depression, anxiety and issues regarding community resources, adult day care programs, adult living facilities, or memory care questions:   Feel free to contact Misty Lisabeth Register, Social Worker at [phone]   For assessment of decision of mental capacity and competency:  Call Dr. Erick Blinks, geriatric psychiatrist at [phone]  For guidance in geriatric dementia issues please call [facility] [phone]   If you have any severe symptoms of a stroke, or other severe issues such as confusion,severe chills or fever, etc call 911 or go to the ER as you may need to be evaluated further      RECOMMENDATIONS FOR ALL PATIENTS WITH MEMORY PROBLEMS: 1. Continue to exercise (Recommend 30 minutes of walking everyday, or 3 hours every week) 2. Increase social interactions - continue going to [place] and enjoy social gatherings with friends and family 3. Eat healthy, avoid fried foods and eat more fruits and vegetables 4. Maintain adequate blood pressure, blood sugar, and blood cholesterol level. Reducing the risk of stroke and cardiovascular disease also helps promoting better memory. 5. Avoid stressful situations. Live a simple life and avoid aggravations. Organize your time and prepare for the next day in anticipation. 6. Sleep well, avoid any interruptions of sleep and avoid any distractions in the bedroom that may interfere with adequate sleep quality 7. Avoid sugar, avoid sweets as there is a strong link  between excessive sugar intake, diabetes, and cognitive impairment We discussed the Mediterranean diet, which has been shown to help patients reduce the risk of progressive memory disorders and reduces cardiovascular risk. This includes eating fish, eat fruits and green leafy vegetables, nuts like almonds and hazelnuts, walnuts, and also use olive oil. Avoid fast foods and fried foods as much as possible. Avoid sweets and sugar as sugar use has been linked to worsening of memory function.  There is always a concern of gradual progression of memory problems. If this is the case, then we may need to adjust level of care according to patient needs. Support, both to the patient and caregiver, should then be put into place.    FALL PRECAUTIONS: Be cautious when walking. Scan the area for obstacles that may increase the risk of trips and falls. When getting up in the mornings, sit up at the edge of the bed for a few minutes before getting out of bed. Consider elevating the bed at the head end to avoid drop of blood pressure when getting up. Walk always in a well-lit room (use night lights in the walls). Avoid area rugs or power cords from appliances in the middle of the walkways. Use a walker or a cane if necessary and consider physical therapy for balance exercise. Get your eyesight checked regularly.  FINANCIAL OVERSIGHT: Supervision, especially oversight when making financial decisions or transactions is also recommended.  HOME SAFETY: Consider the safety of the kitchen when operating appliances like stoves, microwave oven, and blender. Consider having supervision and share cooking responsibilities until no longer able to participate in those. Accidents  with firearms and other hazards in the house should be identified and addressed as well.   ABILITY TO BE LEFT ALONE: If patient is unable to contact 911 operator, consider using LifeLine, or when the need is there, arrange for someone to stay with patients.  Smoking is a fire hazard, consider supervision or cessation. Risk of wandering should be assessed by caregiver and if detected at any point, supervision and safe proof recommendations should be instituted.  MEDICATION SUPERVISION: Inability to self-administer medication needs to be constantly addressed. Implement a mechanism to ensure safe administration of the medications.     Mediterranean Diet A Mediterranean diet refers to food and lifestyle choices that are based on the traditions of countries located on the [employer] [place]. This way of eating has been shown to help prevent certain conditions and improve outcomes for people who have chronic diseases, like kidney disease and heart disease. What are tips for following this plan? Lifestyle  Cook and eat meals together with your family, when possible. Drink enough fluid to keep your urine clear or pale yellow. Be physically active every day. This includes: Aerobic exercise like running or swimming. Leisure activities like gardening, walking, or housework. Get 7-8 hours of sleep each night. If recommended by your health care provider, drink red wine in moderation. This means 1 glass a day for nonpregnant women and 2 glasses a day for men. A glass of wine equals 5 oz (150 mL). Reading food labels  Check the serving size of packaged foods. For foods such as rice and pasta, the serving size refers to the amount of cooked product, not dry. Check the total fat in packaged foods. Avoid foods that have saturated fat or trans fats. Check the ingredients list for added sugars, such as corn syrup. Shopping  At the grocery store, buy most of your food from the areas near the walls of the store. This includes: Fresh fruits and vegetables (produce). Grains, beans, nuts, and seeds. Some of these may be available in unpackaged forms or large amounts (in bulk). Fresh seafood. Poultry and eggs. Low-fat dairy products. Buy whole ingredients instead of  prepackaged foods. Buy fresh fruits and vegetables in-season from local farmers markets. Buy frozen fruits and vegetables in resealable bags. If you do not have access to quality fresh seafood, buy precooked frozen shrimp or canned fish, such as tuna, salmon, or sardines. Buy small amounts of raw or cooked vegetables, salads, or olives from the deli or salad bar at your store. Stock your pantry so you always have certain foods on hand, such as olive oil, canned tuna, canned tomatoes, rice, pasta, and beans. Cooking  Cook foods with extra-virgin olive oil instead of using butter or other vegetable oils. Have meat as a side dish, and have vegetables or grains as your main dish. This means having meat in small portions or adding small amounts of meat to foods like pasta or stew. Use beans or vegetables instead of meat in common dishes like chili or lasagna. Experiment with different cooking methods. Try roasting or broiling vegetables instead of steaming or sauteing them. Add frozen vegetables to soups, stews, pasta, or rice. Add nuts or seeds for added healthy fat at each meal. You can add these to yogurt, salads, or vegetable dishes. Marinate fish or vegetables using olive oil, lemon juice, garlic, and fresh herbs. Meal planning  Plan to eat 1 vegetarian meal one day each week. Try to work up to 2 vegetarian meals, if possible. Eat seafood  2 or more times a week. Have healthy snacks readily available, such as: Vegetable sticks with hummus. Greek yogurt. Fruit and nut trail mix. Eat balanced meals throughout the week. This includes: Fruit: 2-3 servings a day Vegetables: 4-5 servings a day Low-fat dairy: 2 servings a day Fish, poultry, or lean meat: 1 serving a day Beans and legumes: 2 or more servings a week Nuts and seeds: 1-2 servings a day Whole grains: 6-8 servings a day Extra-virgin olive oil: 3-4 servings a day Limit red meat and sweets to only a few servings a month What are my  food choices? Mediterranean diet Recommended Grains: Whole-grain pasta. Brown rice. Bulgar wheat. Polenta. Couscous. Whole-wheat bread. Orpah Cobb. Vegetables: Artichokes. Beets. Broccoli. Cabbage. Carrots. Eggplant. Green beans. Chard. Kale. Spinach. Onions. Leeks. Peas. Squash. Tomatoes. Peppers. Radishes. Fruits: Apples. Apricots. Avocado. Berries. Bananas. Cherries. Dates. Figs. Grapes. Lemons. Melon. Oranges. Peaches. Plums. Pomegranate. Meats and other protein foods: Beans. Almonds. Sunflower seeds. Pine nuts. Peanuts. Cod. Salmon. Scallops. Shrimp. Tuna. Tilapia. Clams. Oysters. Eggs. Dairy: Low-fat milk. Cheese. Greek yogurt. Beverages: Water. Red wine. Herbal tea. Fats and oils: Extra virgin olive oil. Avocado oil. Grape seed oil. Sweets and desserts: [place] yogurt with honey. Baked apples. Poached pears. Trail mix. Seasoning and other foods: Basil. Cilantro. Coriander. Cumin. Mint. Parsley. Sage. Rosemary. Tarragon. Garlic. Oregano. Thyme. Pepper. Balsalmic vinegar. Tahini. Hummus. Tomato sauce. Olives. Mushrooms. Limit these Grains: Prepackaged pasta or rice dishes. Prepackaged cereal with added sugar. Vegetables: Deep fried potatoes (french fries). Fruits: Fruit canned in syrup. Meats and other protein foods: Beef. Pork. Lamb. Poultry with skin. Hot dogs. Tomasa Blase. Dairy: Ice cream. Sour cream. Whole milk. Beverages: Juice. Sugar-sweetened soft drinks. Beer. Liquor and spirits. Fats and oils: Butter. Canola oil. Vegetable oil. Beef fat (tallow). Lard. Sweets and desserts: Cookies. Cakes. Pies. Candy. Seasoning and other foods: Mayonnaise. Premade sauces and marinades. The items listed may not be a complete list. Talk with your dietitian about what dietary choices are right for you. Summary The Mediterranean diet includes both food and lifestyle choices. Eat a variety of fresh fruits and vegetables, beans, nuts, seeds, and whole grains. Limit the amount of red meat and sweets  that you eat. Talk with your health care provider about whether it is safe for you to drink red wine in moderation. This means 1 glass a day for nonpregnant women and 2 glasses a day for men. A glass of wine equals 5 oz (150 mL). This information is not intended to replace advice given to you by your health care provider. Make sure you discuss any questions you have with your health care provider. Document Released: 04/30/2016 Document Revised: 06/02/2016 Document Reviewed: 04/30/2016 Elsevier Interactive Patient Education  2017 [employer].   We have sent a referral to [facility] for your MRI and they will call you directly to schedule your appointment. They are located at [address] [facility]. If you need to contact them directly please call [phone].

## 2023-08-02 ENCOUNTER — Telehealth: Payer: Self-pay | Admitting: Physician Assistant

## 2023-08-02 NOTE — Telephone Encounter (Signed)
Caller left a message with the access nurse. Did not leave her name. 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

## 2023-08-02 NOTE — Telephone Encounter (Signed)
Orders are in sara's box to sign and fax back. Will place on her desk.

## 2023-08-16 ENCOUNTER — Telehealth: Payer: Self-pay | Admitting: Physician Assistant

## 2023-08-16 NOTE — Telephone Encounter (Signed)
Yes, it was received and faxed back, I will call her shortly. In clinic

## 2023-08-16 NOTE — Telephone Encounter (Signed)
Colleen Parker with [facility] called and left a message with the access nurse. She wants to confirm that the office received an order they faxed? Order # [id number]

## 2023-08-22 NOTE — Progress Notes (Unsigned)
Cardiology Office Note:    Date:  08/24/2023   ID:  Colleen Parker, DOB Jul 27, 1946, MRN [id number]  PCP:  Lula Olszewski, MD  Cardiologist:  Little Ishikawa, MD  Electrophysiologist:  None   Referring MD: Lula Olszewski, MD   Chief Complaint  Patient presents with   Aneurysm    History of Present Illness:    Colleen Parker is a 77 y.o. female with a hx of asthma, migraines who presents for follow-up.  She was referred by Dr. Dorris Fetch for evaluation of aortic aneurysm, initially seen on 05/15/2021 she presented with shortness of breath to the ED in June 2022.  CTPA showed no PE but found to have 5.3-5.4 cm ascending aortic aneurysm (no contrast suboptimal as was timed to review pulmonary arteries).  She saw Dr. Dorris Fetch in clinic and CTA chest was ordered on 05/06/21 which showed 5.3 cm ascending aortic aneurysm.  Dr. Dorris Fetch recommended surgery and referred to cardiology for cardiac catheterization prior to surgical repair.  She declined cardiac catheterization but was willing to undergo coronary CTA.  Coronary CTA 05/21/2021 showed calcium score 319 (83rd percentile), nonobstructive CAD.  Family history includes maternal grandmother died from aneurysm, mother had an aortic aneurysm and brother has an abdominal aneurysm.  Since last clinic visit, she reports has been doing okay.  Denies any chest pain, dyspnea, lightheadedness, syncope, lower extremity edema, or palpitations.  Reports that BP has been running low recently   BP Readings from Last 3 Encounters:  08/24/23 90/60  07/26/23 (!) 83/57  06/29/23 104/67     Past Medical History:  Diagnosis Date   Chronic headaches    Menopausal state    Migraines    Recurrent sinus infections    Vascular dementia without behavioral disturbance, psychotic disturbance, mood disturbance, or anxiety (HCC)     Past Surgical History:  Procedure Laterality Date   DILATION AND CURETTAGE OF UTERUS     abnl cells   TONSILLECTOMY   1956    Current Medications: Current Meds  Medication Sig   acetaminophen (TYLENOL) 500 MG tablet Take 1,000 mg by mouth as needed for mild pain or headache.   albuterol (VENTOLIN HFA) 108 (90 Base) MCG/ACT inhaler Inhale 2 puffs into the lungs 3 (three) times daily as needed for wheezing or shortness of breath.   alendronate (FOSAMAX) 70 MG tablet Take 70 mg by mouth once a week.   aspirin EC 81 MG tablet Take 81 mg by mouth daily. Swallow whole.   AZO-CRANBERRY PO Take 1 tablet by mouth in the morning and at bedtime.   cholecalciferol (VITAMIN D3) 25 MCG (1000 UNIT) tablet Take 1,000 Units by mouth daily.   Cyanocobalamin (VITAMIN B-12) 1000 MCG SUBL Place 1 tablet under the tongue daily.   loratadine (CLARITIN) 10 MG tablet Take 10 mg by mouth daily as needed for allergies.   meloxicam (MOBIC) 15 MG tablet Take 15 mg by mouth as needed for pain.   memantine (NAMENDA) 10 MG tablet Take 1 tablet (10 mg total) by mouth 2 (two) times daily.   Probiotic Product (CULTURELLE PROBIOTICS) CHEW Chew 1 tablet by mouth daily.   rosuvastatin (CRESTOR) 10 MG tablet Take 1 tablet (10 mg total) by mouth daily.   sennosides-docusate sodium (SENOKOT-S) 8.6-50 MG tablet Take 1 tablet by mouth daily.     Allergies:   Penicillins, Quinolones, and Sulfamethoxazole   Social History   Socioeconomic History   Marital status: Widowed    Spouse name: Not on  file   Number of children: 0   Years of education: BA   Highest education level: Bachelor's degree (e.g., BA, AB, BS)  Occupational History   Occupation: Retired     Comment: Runner, broadcasting/film/video retired  Tobacco Use   Smoking status: Never   Smokeless tobacco: Never  Substance and Sexual Activity   Alcohol use: Not Currently    Comment: ocass   Drug use: Never   Sexual activity: Not on file  Other Topics Concern   Not on file  Social History Narrative   Lives at home and younger sister lives with her    Caffeine use: coffee  And tea sometimes   Right  handed   Social Determinants of Health   Financial Resource Strain: Not on file  Food Insecurity: No Food Insecurity (06/14/2022)   Hunger Vital Sign    Worried About Running Out of Food in the Last Year: Never true    Ran Out of Food in the Last Year: Never true  Transportation Needs: Not on file  Physical Activity: Not on file  Stress: Not on file  Social Connections: Unknown (03/12/2022)   Received [facility]   Social Network    Social Network: Not on file     Family History: The patient's family history includes Alzheimer's disease in her mother; Heart attack in her father and mother; Heart disease in her brother.  ROS:   Please see the history of present illness.     All other systems reviewed and are negative.  EKGs/Labs/Other Studies Reviewed:    The following studies were reviewed today:   EKG:  03/17/22: Sinus tachycardia, rate 116, poor R wave progression, less than 1 mm ST depressions in leads I, aVL 08/24/23:  09/20/2023: Sinus tachycardia, rate 107, low voltage, right axis deviation   Recent Labs: No results found for requested labs within last 365 days.  Recent Lipid Panel    Component Value Date/Time   CHOL 123 09/17/2022 1100   TRIG 83 09/17/2022 1100   HDL 44 09/17/2022 1100   CHOLHDL 2.8 09/17/2022 1100   LDLCALC 63 09/17/2022 1100    Physical Exam:    VS:  BP 90/60 (BP Location: Left Arm, Patient Position: Sitting, Cuff Size: Normal)   Pulse (!) 107   Ht 5\' 5"  (1.651 m)   Wt 123 lb (55.8 kg)   SpO2 100%   BMI 20.47 kg/m     Wt Readings from Last 3 Encounters:  08/24/23 123 lb (55.8 kg)  06/29/23 126 lb (57.2 kg)  03/19/23 126 lb 3.2 oz (57.2 kg)     GEN:  Well nourished, well developed in no acute distress HEENT: Normal NECK: No JVD; No carotid bruits LYMPHATICS: No lymphadenopathy CARDIAC: RRR, no murmurs, rubs, gallops RESPIRATORY:  Clear to auscultation without rales, wheezing or rhonchi  ABDOMEN: Soft,  non-tender, non-distended MUSCULOSKELETAL:  No edema; No deformity  SKIN: Warm and dry NEUROLOGIC:  Alert and oriented x 3 PSYCHIATRIC:  Normal affect   ASSESSMENT:    1. Thoracic aortic aneurysm without rupture, unspecified part (HCC)   2. Hyperlipidemia, unspecified hyperlipidemia type   3. Coronary artery disease involving native coronary artery of native heart without angina pectoris   4. Hypotension, unspecified hypotension type       PLAN:     Thoracic aortic aneurysm: CTA chest was ordered on 05/06/21 which showed 5.3 cm ascending aortic aneurysm.  Echo 04/24/2021 showed EF 50 to 55%, normal RV function, small pericardial  effusion, mild AI.  Repeat CTA chest 10/2021 showed stable aortic aneurysm.  BP appears controlled.  She is following with thoracic surgery.  CT chest 05/20/2022 showed stable 5.2 cm ascending thoracic aortic aneurysm.  CT chest 05/2023 showed ascending aortic aneurysm measuring 5.5 cm -She was seen by Dr. [name], not surgical candidate at this time  Nonobstructive CAD: Coronary CTA 05/21/2021 showed calcium score 319 (83rd percentile), nonobstructive CAD.  Echocardiogram 04/24/2021 showed EF 50 to 55%, normal diastolic function, normal RV function, small pericardial effusion, mild AI -LDL 123 on 03/17/2022, started rosuvastatin 10 mg daily.  LDL 63 on 09/17/2022.  Check lipid panel  Carotid artery stenosis: CTA chest 05/20/2022 noted severe narrowing of proximal left internal carotid artery.  Carotid duplex 05/22/2022 showed 1 to 39% right carotid stenosis, 40 to 59% left carotid stenosis.  Stable carotid duplex 06/2023.  Continue aspirin, statin  Hypotension: soft BP in clinic today.  Her sister reports that her BP has been running low.  Appears asymptomatic.  Check CMET, CBC, lactate  RTC in 6 months  Medication Adjustments/Labs and Tests Ordered: Current medicines are reviewed at length with the patient today.  Concerns regarding medicines are outlined above.  Orders  Placed This Encounter  Procedures   Comprehensive Metabolic Panel (CMET)   CBC w/Diff/Platelet   Lactic acid, plasma   Lipid panel   EKG 12-Lead    No orders of the defined types were placed in this encounter.    Patient Instructions  Medication Instructions:  Continue current medications *If you need a refill on your cardiac medications before your next appointment, please call your pharmacy*   Lab Work: CMET, CBC, lactic acid, Lipid Panel If you have labs (blood work) drawn today and your tests are completely normal, you will receive your results only by: MyChart Message (if you have MyChart) OR A paper copy in the mail If you have any lab test that is abnormal or we need to change your treatment, we will call you to review the results.   Testing/Procedures: none   Follow-Up: At [facility], you and your health needs are our priority.  As part of our continuing mission to provide you with exceptional heart care, we have created designated Provider Care Teams.  These Care Teams include your primary Cardiologist (physician) and Advanced Practice Providers (APPs -  Physician Assistants and Nurse Practitioners) who all work together to provide you with the care you need, when you need it.  We recommend signing up for the patient portal called "MyChart".  Sign up information is provided on this After Visit Summary.  MyChart is used to connect with patients for Virtual Visits (Telemedicine).  Patients are able to view lab/test results, encounter notes, upcoming appointments, etc.  Non-urgent messages can be sent to your provider as well.   To learn more about what you can do with MyChart, go to [email or web address].    Your next appointment:   6 month(s)  Provider:   Little Ishikawa, MD     Other Instructions none    Signed, Little Ishikawa, MD  08/24/2023 5:49 PM    [facility]

## 2023-08-24 ENCOUNTER — Ambulatory Visit: Payer: Medicare Other | Attending: Cardiology | Admitting: Cardiology

## 2023-08-24 ENCOUNTER — Encounter: Payer: Self-pay | Admitting: Cardiology

## 2023-08-24 VITALS — BP 90/60 | HR 107 | Ht 65.0 in | Wt 123.0 lb

## 2023-08-24 DIAGNOSIS — E785 Hyperlipidemia, unspecified: Secondary | ICD-10-CM | POA: Diagnosis present

## 2023-08-24 DIAGNOSIS — I712 Thoracic aortic aneurysm, without rupture, unspecified: Secondary | ICD-10-CM | POA: Insufficient documentation

## 2023-08-24 DIAGNOSIS — I2510 Atherosclerotic heart disease of native coronary artery without angina pectoris: Secondary | ICD-10-CM | POA: Diagnosis not present

## 2023-08-24 DIAGNOSIS — I959 Hypotension, unspecified: Secondary | ICD-10-CM | POA: Diagnosis not present

## 2023-08-24 NOTE — Patient Instructions (Signed)
Medication Instructions:  Continue current medications *If you need a refill on your cardiac medications before your next appointment, please call your pharmacy*   Lab Work: CMET, CBC, lactic acid, Lipid Panel If you have labs (blood work) drawn today and your tests are completely normal, you will receive your results only by: MyChart Message (if you have MyChart) OR A paper copy in the mail If you have any lab test that is abnormal or we need to change your treatment, we will call you to review the results.   Testing/Procedures: none   Follow-Up: At [facility], you and your health needs are our priority.  As part of our continuing mission to provide you with exceptional heart care, we have created designated Provider Care Teams.  These Care Teams include your primary Cardiologist (physician) and Advanced Practice Providers (APPs -  Physician Assistants and Nurse Practitioners) who all work together to provide you with the care you need, when you need it.  We recommend signing up for the patient portal called "MyChart".  Sign up information is provided on this After Visit Summary.  MyChart is used to connect with patients for Virtual Visits (Telemedicine).  Patients are able to view lab/test results, encounter notes, upcoming appointments, etc.  Non-urgent messages can be sent to your provider as well.   To learn more about what you can do with MyChart, go to [email or web address].    Your next appointment:   6 month(s)  Provider:   Little Ishikawa, MD     Other Instructions none

## 2023-08-25 LAB — LIPID PANEL
Cholesterol, Total: 116 mg/dL (ref 100–199)
HDL: 46 mg/dL (ref >39)
LDL CALC COMMENT:: 2.5 ratio (ref 0.0–4.4)
LDL Chol Calc (NIH): 51 mg/dL (ref 0–99)
Triglycerides: 105 mg/dL (ref 0–149)
VLDL Cholesterol Cal: 19 mg/dL (ref 5–40)

## 2023-08-25 LAB — COMPREHENSIVE METABOLIC PANEL
ALT: 8 IU/L (ref 0–32)
AST: 14 IU/L (ref 0–40)
Albumin: 4.1 g/dL (ref 3.8–4.8)
Alkaline Phosphatase: 80 IU/L (ref 44–121)
BUN/Creatinine Ratio: 10 — ABNORMAL LOW (ref 12–28)
BUN: 7 mg/dL — ABNORMAL LOW (ref 8–27)
Bilirubin Total: 0.2 mg/dL (ref 0.0–1.2)
CO2: 20 mmol/L (ref 20–29)
Calcium: 9.3 mg/dL (ref 8.7–10.3)
Chloride: 104 mmol/L (ref 96–106)
Creatinine, Ser: 0.72 mg/dL (ref 0.57–1.00)
Globulin, Total: 2.8 g/dL (ref 1.5–4.5)
Glucose: 97 mg/dL (ref 70–99)
Potassium: 4.4 mmol/L (ref 3.5–5.2)
Sodium: 140 mmol/L (ref 134–144)
Total Protein: 6.9 g/dL (ref 6.0–8.5)
eGFR: 87 mL/min/{1.73_m2} (ref >59)

## 2023-08-25 LAB — CBC WITH DIFFERENTIAL/PLATELET
Basophils Absolute: 0.1 10*3/uL (ref 0.0–0.2)
Basos: 1 %
EOS (ABSOLUTE): 0.2 10*3/uL (ref 0.0–0.4)
Eos: 2 %
Hematocrit: 40.1 % (ref 34.0–46.6)
Hemoglobin: 12.9 g/dL (ref 11.1–15.9)
Immature Grans (Abs): 0 10*3/uL (ref 0.0–0.1)
Immature Granulocytes: 0 %
Lymphocytes Absolute: 1.8 10*3/uL (ref 0.7–3.1)
Lymphs: 18 %
MCH: 30.2 pg (ref 26.6–33.0)
MCHC: 32.2 g/dL (ref 31.5–35.7)
MCV: 94 fL (ref 79–97)
Monocytes Absolute: 1 10*3/uL — ABNORMAL HIGH (ref 0.1–0.9)
Monocytes: 10 %
Neutrophils Absolute: 6.9 10*3/uL (ref 1.4–7.0)
Neutrophils: 69 %
Platelets: 361 10*3/uL (ref 150–450)
RBC: 4.27 x10E6/uL (ref 3.77–5.28)
RDW: 13.7 % (ref 11.7–15.4)
WBC: 10 10*3/uL (ref 3.4–10.8)

## 2023-08-25 LAB — LACTIC ACID, PLASMA: Lactate, Ven: 13.2 mg/dL (ref 4.8–25.7)

## 2023-08-30 ENCOUNTER — Telehealth: Payer: Self-pay | Admitting: Physician Assistant

## 2023-08-30 NOTE — Telephone Encounter (Signed)
Caitlin with [facility] called in and left a message with the access nurse. She is calling to verify order [id number] that was faxed was received?

## 2023-08-31 NOTE — Telephone Encounter (Signed)
Fax to come over at 8:34 08/31/2023

## 2023-09-08 ENCOUNTER — Telehealth: Payer: Self-pay | Admitting: Physician Assistant

## 2023-09-08 NOTE — Telephone Encounter (Signed)
[facility] called to see if we received the order #[id number] on the 13th of December. Neysa Bonito is out, so I asked mahina. Mahina will check and get back with [facility]  [facility] number is [phone]

## 2023-09-09 NOTE — Telephone Encounter (Signed)
Order [phone] was the only order I could locate. I called [employer] and there was no answer at the extension given. Faxed over order # [id number] that was located in file and requested them to resend order [phone].

## 2023-10-29 ENCOUNTER — Ambulatory Visit: Payer: Medicare Other | Admitting: Podiatry

## 2023-11-02 ENCOUNTER — Encounter: Payer: Self-pay | Admitting: Podiatry

## 2023-11-02 ENCOUNTER — Ambulatory Visit (INDEPENDENT_AMBULATORY_CARE_PROVIDER_SITE_OTHER): Payer: Medicare Other | Admitting: Podiatry

## 2023-11-02 DIAGNOSIS — B351 Tinea unguium: Secondary | ICD-10-CM

## 2023-11-02 DIAGNOSIS — F01C0 Vascular dementia, severe, without behavioral disturbance, psychotic disturbance, mood disturbance, and anxiety: Secondary | ICD-10-CM

## 2023-11-02 DIAGNOSIS — M79675 Pain in left toe(s): Secondary | ICD-10-CM | POA: Diagnosis not present

## 2023-11-02 DIAGNOSIS — M79674 Pain in right toe(s): Secondary | ICD-10-CM | POA: Diagnosis not present

## 2023-11-02 NOTE — Progress Notes (Signed)
This patient returns to the office for evaluation and treatment of long thick painful nails .  This patient is unable to trim her own nails since the patient cannot reach her feet.  Patient says the nails are painful walking and wearing his shoes.  She returns for preventive foot care services.  She presents with female caregiver.  General Appearance  Alert, conversant and in no acute stress.  Vascular  Dorsalis pedis and posterior tibial  pulses are  weakly palpable  bilaterally.  Capillary return is within normal limits  bilaterally. Cold feet  bilaterally.  Neurologic  Senn-Weinstein monofilament wire test within normal limits  bilaterally. Muscle power within normal limits bilaterally.  Nails Thick disfigured discolored nails with subungual debris  from hallux to fifth toes bilaterally. No evidence of bacterial infection or drainage bilaterally.  Orthopedic  No limitations of motion  feet .  No crepitus or effusions noted.  HAV  B/L.  Hammet toes  B/L.  Skin  normotropic skin with no porokeratosis noted bilaterally.  No signs of infections or ulcers noted.     Onychomycosis  Pain in toes right foot  Pain in toes left foot  Debridement  of nails  1-5  B/L with a nail nipper.  Nails were then filed using a dremel tool with no incidents.    RTC 3 months    Helane Gunther DPM

## 2023-12-20 ENCOUNTER — Other Ambulatory Visit: Payer: Self-pay | Admitting: Family Medicine

## 2023-12-20 DIAGNOSIS — R0989 Other specified symptoms and signs involving the circulatory and respiratory systems: Secondary | ICD-10-CM

## 2023-12-30 ENCOUNTER — Ambulatory Visit
Admission: RE | Admit: 2023-12-30 | Discharge: 2023-12-30 | Disposition: A | Discharge status: Home / Self Care | Source: Ambulatory Visit | Attending: Family Medicine | Admitting: Family Medicine

## 2023-12-30 ENCOUNTER — Other Ambulatory Visit: Payer: Self-pay | Admitting: Family Medicine

## 2023-12-30 DIAGNOSIS — R0989 Other specified symptoms and signs involving the circulatory and respiratory systems: Secondary | ICD-10-CM

## 2024-01-24 ENCOUNTER — Ambulatory Visit (INDEPENDENT_AMBULATORY_CARE_PROVIDER_SITE_OTHER): Payer: Self-pay | Admitting: Physician Assistant

## 2024-01-24 ENCOUNTER — Encounter: Payer: Self-pay | Admitting: Physician Assistant

## 2024-01-24 VITALS — BP 95/80 | HR 67 | Resp 20

## 2024-01-24 DIAGNOSIS — F01B0 Vascular dementia, moderate, without behavioral disturbance, psychotic disturbance, mood disturbance, and anxiety: Secondary | ICD-10-CM | POA: Diagnosis not present

## 2024-01-24 DIAGNOSIS — G3101 Pick's disease: Secondary | ICD-10-CM

## 2024-01-24 DIAGNOSIS — R413 Other amnesia: Secondary | ICD-10-CM | POA: Diagnosis not present

## 2024-01-24 DIAGNOSIS — F0280 Dementia in other diseases classified elsewhere without behavioral disturbance: Secondary | ICD-10-CM | POA: Diagnosis not present

## 2024-01-24 NOTE — Patient Instructions (Signed)
 It was a pleasure to see you today at our office.   Recommendations:  Continue memantine 10 mg  two times a day  Referral to PT for strength and deconditioning  and ST  Continue aspirin daily  Follow up in 6 months   Whom to call:  Memory  decline, memory medications: Call our office [phone]   For psychiatric meds, mood meds: Please have your primary care physician manage these medications.   Counseling regarding caregiver distress, including caregiver depression, anxiety and issues regarding community resources, adult day care programs, adult living facilities, or memory care questions:   Feel free to contact Misty Lisabeth Register, Social Worker at [phone]   For assessment of decision of mental capacity and competency:  Call Dr. Erick Blinks, geriatric psychiatrist at [phone]  For guidance in geriatric dementia issues please call [facility] [phone]   If you have any severe symptoms of a stroke, or other severe issues such as confusion,severe chills or fever, etc call 911 or go to the ER as you may need to be evaluated further      RECOMMENDATIONS FOR ALL PATIENTS WITH MEMORY PROBLEMS: 1. Continue to exercise (Recommend 30 minutes of walking everyday, or 3 hours every week) 2. Increase social interactions - continue going to [place] and enjoy social gatherings with friends and family 3. Eat healthy, avoid fried foods and eat more fruits and vegetables 4. Maintain adequate blood pressure, blood sugar, and blood cholesterol level. Reducing the risk of stroke and cardiovascular disease also helps promoting better memory. 5. Avoid stressful situations. Live a simple life and avoid aggravations. Organize your time and prepare for the next day in anticipation. 6. Sleep well, avoid any interruptions of sleep and avoid any distractions in the bedroom that may interfere with adequate sleep quality 7. Avoid sugar, avoid sweets as there is a strong link  between excessive sugar intake, diabetes, and cognitive impairment We discussed the Mediterranean diet, which has been shown to help patients reduce the risk of progressive memory disorders and reduces cardiovascular risk. This includes eating fish, eat fruits and green leafy vegetables, nuts like almonds and hazelnuts, walnuts, and also use olive oil. Avoid fast foods and fried foods as much as possible. Avoid sweets and sugar as sugar use has been linked to worsening of memory function.  There is always a concern of gradual progression of memory problems. If this is the case, then we may need to adjust level of care according to patient needs. Support, both to the patient and caregiver, should then be put into place.    FALL PRECAUTIONS: Be cautious when walking. Scan the area for obstacles that may increase the risk of trips and falls. When getting up in the mornings, sit up at the edge of the bed for a few minutes before getting out of bed. Consider elevating the bed at the head end to avoid drop of blood pressure when getting up. Walk always in a well-lit room (use night lights in the walls). Avoid area rugs or power cords from appliances in the middle of the walkways. Use a walker or a cane if necessary and consider physical therapy for balance exercise. Get your eyesight checked regularly.  FINANCIAL OVERSIGHT: Supervision, especially oversight when making financial decisions or transactions is also recommended.  HOME SAFETY: Consider the safety of the kitchen when operating appliances like stoves, microwave oven, and blender. Consider having supervision and share cooking responsibilities until no longer able to participate in those. Accidents  with firearms and other hazards in the house should be identified and addressed as well.   ABILITY TO BE LEFT ALONE: If patient is unable to contact 911 operator, consider using LifeLine, or when the need is there, arrange for someone to stay with patients.  Smoking is a fire hazard, consider supervision or cessation. Risk of wandering should be assessed by caregiver and if detected at any point, supervision and safe proof recommendations should be instituted.  MEDICATION SUPERVISION: Inability to self-administer medication needs to be constantly addressed. Implement a mechanism to ensure safe administration of the medications.     Mediterranean Diet A Mediterranean diet refers to food and lifestyle choices that are based on the traditions of countries located on the [employer]. This way of eating has been shown to help prevent certain conditions and improve outcomes for people who have chronic diseases, like kidney disease and heart disease. What are tips for following this plan? Lifestyle  Cook and eat meals together with your family, when possible. Drink enough fluid to keep your urine clear or pale yellow. Be physically active every day. This includes: Aerobic exercise like running or swimming. Leisure activities like gardening, walking, or housework. Get 7-8 hours of sleep each night. If recommended by your health care provider, drink red wine in moderation. This means 1 glass a day for nonpregnant women and 2 glasses a day for men. A glass of wine equals 5 oz (150 mL). Reading food labels  Check the serving size of packaged foods. For foods such as rice and pasta, the serving size refers to the amount of cooked product, not dry. Check the total fat in packaged foods. Avoid foods that have saturated fat or trans fats. Check the ingredients list for added sugars, such as corn syrup. Shopping  At the grocery store, buy most of your food from the areas near the walls of the store. This includes: Fresh fruits and vegetables (produce). Grains, beans, nuts, and seeds. Some of these may be available in unpackaged forms or large amounts (in bulk). Fresh seafood. Poultry and eggs. Low-fat dairy products. Buy whole ingredients instead of  prepackaged foods. Buy fresh fruits and vegetables in-season from local farmers markets. Buy frozen fruits and vegetables in resealable bags. If you do not have access to quality fresh seafood, buy precooked frozen shrimp or canned fish, such as tuna, salmon, or sardines. Buy small amounts of raw or cooked vegetables, salads, or olives from the deli or salad bar at your store. Stock your pantry so you always have certain foods on hand, such as olive oil, canned tuna, canned tomatoes, rice, pasta, and beans. Cooking  Cook foods with extra-virgin olive oil instead of using butter or other vegetable oils. Have meat as a side dish, and have vegetables or grains as your main dish. This means having meat in small portions or adding small amounts of meat to foods like pasta or stew. Use beans or vegetables instead of meat in common dishes like chili or lasagna. Experiment with different cooking methods. Try roasting or broiling vegetables instead of steaming or sauteing them. Add frozen vegetables to soups, stews, pasta, or rice. Add nuts or seeds for added healthy fat at each meal. You can add these to yogurt, salads, or vegetable dishes. Marinate fish or vegetables using olive oil, lemon juice, garlic, and fresh herbs. Meal planning  Plan to eat 1 vegetarian meal one day each week. Try to work up to 2 vegetarian meals, if possible. Eat seafood  2 or more times a week. Have healthy snacks readily available, such as: Vegetable sticks with hummus. Greek yogurt. Fruit and nut trail mix. Eat balanced meals throughout the week. This includes: Fruit: 2-3 servings a day Vegetables: 4-5 servings a day Low-fat dairy: 2 servings a day Fish, poultry, or lean meat: 1 serving a day Beans and legumes: 2 or more servings a week Nuts and seeds: 1-2 servings a day Whole grains: 6-8 servings a day Extra-virgin olive oil: 3-4 servings a day Limit red meat and sweets to only a few servings a month What are my  food choices? Mediterranean diet Recommended Grains: Whole-grain pasta. Brown rice. Bulgar wheat. Polenta. Couscous. Whole-wheat bread. Orpah Cobb. Vegetables: Artichokes. Beets. Broccoli. Cabbage. Carrots. Eggplant. Green beans. Chard. Kale. Spinach. Onions. Leeks. Peas. Squash. Tomatoes. Peppers. Radishes. Fruits: Apples. Apricots. Avocado. Berries. Bananas. Cherries. Dates. Figs. Grapes. Lemons. Melon. Oranges. Peaches. Plums. Pomegranate. Meats and other protein foods: Beans. Almonds. Sunflower seeds. Pine nuts. Peanuts. Cod. Salmon. Scallops. Shrimp. Tuna. Tilapia. Clams. Oysters. Eggs. Dairy: Low-fat milk. Cheese. Greek yogurt. Beverages: Water. Red wine. Herbal tea. Fats and oils: Extra virgin olive oil. Avocado oil. Grape seed oil. Sweets and desserts: [place] yogurt with honey. Baked apples. Poached pears. Trail mix. Seasoning and other foods: Basil. Cilantro. Coriander. Cumin. Mint. Parsley. Sage. Rosemary. Tarragon. Garlic. Oregano. Thyme. Pepper. Balsalmic vinegar. Tahini. Hummus. Tomato sauce. Olives. Mushrooms. Limit these Grains: Prepackaged pasta or rice dishes. Prepackaged cereal with added sugar. Vegetables: Deep fried potatoes (french fries). Fruits: Fruit canned in syrup. Meats and other protein foods: Beef. Pork. Lamb. Poultry with skin. Hot dogs. Tomasa Blase. Dairy: Ice cream. Sour cream. Whole milk. Beverages: Juice. Sugar-sweetened soft drinks. Beer. Liquor and spirits. Fats and oils: Butter. Canola oil. Vegetable oil. Beef fat (tallow). Lard. Sweets and desserts: Cookies. Cakes. Pies. Candy. Seasoning and other foods: Mayonnaise. Premade sauces and marinades. The items listed may not be a complete list. Talk with your dietitian about what dietary choices are right for you. Summary The Mediterranean diet includes both food and lifestyle choices. Eat a variety of fresh fruits and vegetables, beans, nuts, seeds, and whole grains. Limit the amount of red meat and sweets  that you eat. Talk with your health care provider about whether it is safe for you to drink red wine in moderation. This means 1 glass a day for nonpregnant women and 2 glasses a day for men. A glass of wine equals 5 oz (150 mL). This information is not intended to replace advice given to you by your health care provider. Make sure you discuss any questions you have with your health care provider. Document Released: 04/30/2016 Document Revised: 06/02/2016 Document Reviewed: 04/30/2016 Elsevier Interactive Patient Education  2017 [employer].   We have sent a referral to [facility] for your MRI and they will call you directly to schedule your appointment. They are located at [address] [facility]. If you need to contact them directly please call [phone].

## 2024-01-24 NOTE — Progress Notes (Signed)
 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  Cognitive and physical decline is noted especially with speech.   She is more ataxic and apraxic than before. We discussed some PT due to significant deconditioning needing her to spend more time on her wheelchair. Daughter is also concerned that she may have more difficulty swallowing, regurgitating. Swallow evaluation has been tried in the past but she cannot stay upright for prolonged periods for accurate results.In the near future she may require scopolamine to decrease her secretions. She is under the care of Palliative care. Concerned that she may require parenteral feeding versus tube feeding if this worsens. Discussed evaluation with speech therapy , as PPA is worse than prior.       Follow up in  6 months.  Continue memantine  10 mg twice daily, side effects discussed Referral ST for swallowing, and speech, and PT for strength and balance Recommend good control of her cardiovascular risk factors Continue to control mood as per PCP Continue palliative care    Subjective:    This patient is accompanied in the office by her daughter and caregiver who supplements the history.  Previous records as well as any outside records available were reviewed prior to todays visit. Patient was last seen on 07/26/23   Any changes in memory since last visit? Cognitive decline, with significant speech difficulty related to PPA  is noted.  repeats oneself?  Denies  Disoriented when walking into a room?  Patient denies    Any personality changes since last visit?  denies   Any worsening depression?:  She has a history of situational depression with  moments of sadness related to aphasia and other physical and cognitive decline  Hallucinations or paranoia?  Denies.   Seizures? denies    Any sleep changes? She sleeps well .  Denies vivid dreams, REM behavior  Sleep apnea?   Denies.   Any hygiene concerns?  She needs full assistance. Does the patient needs help with medications? Daughter  is in charge   Who is in charge of the finances? Daughter  is in charge     Any changes in appetite?   Appetite continues to be decreased, She may be forgetting to eat with a spoon. Daughter reports that patent regurgitates more than before Patient have trouble swallowing?  Yes, worse than before, she is no on speech therapy, completed x 1 year. Daughter concerned that she regurgitates. She has a hiatal hernia.  Any headaches?   denies   Chronic back pain  denies   Ambulates with difficulty?  She is with chair bound, with right lower extremity weakness  Recent falls or head injuries? denies     Unilateral weakness, numbness or tingling? denies   Any tremors?  Denies   Any anosmia?  Denies   Any incontinence of urine?  Endorsed, wears pull-ups Any bowel dysfunction?   Denies      Patient lives with her daughter Does the patient drive? No longer drives   Initial visit 03/03/22  How long did patient have memory difficulties? 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  Then she began having difficulties with short-term memory, and for the last year, she has demonstrated difficulties with speech, "knowing what I want to say, but it does not come out of my mouth ".  She continues to enjoy reading, watching TV and music. Patient lives with: sister and has an aide coming for 8 hrs repeats oneself? Denies  Disoriented when walking into a room?  Patient denies except "always getting confused when trying to find a bathroom " leaving objects in unusual places?   Patient denies   Ambulates  with difficulty?   Patient denies, but uses a walker for stability.   "Walks a lot" Recent falls?  Patient denies   Any head injuries?  She has a history of concussion of the forehead on 11/13/2021, and on 07/22/2020 after a fall after UTI and Requiring hospitalization history of seizures?   Patient denies   Wandering behavior?  Patient denies   Patient drives?   Patient no longer drives. Stopped after the head injury  Any mood changes such irritability agitation?  Patient denies  "She is sweet" Any history of depression?:  Patient denies   Hallucinations?  Patient denies   Paranoia?  Patient denies   Patient reports that he sleeps well without vivid dreams, REM behavior or sleepwalking    History of sleep apnea?  Patient denies   Any hygiene concerns?  Patient denies   Independent of bathing and dressing?  Endorsed  Does the patient needs help with medications?  Caregiver in charge Who is in charge of the finances? Sister Colleen Parker is in charge  Any changes in appetite?  Patient denies   Patient have trouble swallowing? Patient denies   Does the patient cook?  Patient cooks occasionally  Any kitchen accidents such as leaving the stove on? Patient denies   Any headaches?  Patient denies   The double vision? Patient denies   Any focal numbness or tingling?  Patient denies   Chronic back pain Patient denies   Unilateral weakness?  Patient denies   Any tremors?  Patient denies   Any history of anosmia?  Patient denies   Any incontinence of urine?  "Only at night may need a diaper ".  She had a recent UTI and requiring hospitalization in February 2023 Any bowel dysfunction?   Patient denies   History of heavy alcohol intake?  Patient denies   History of heavy tobacco use?  Patient denies   Family history of dementia?  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   albuterol  (VENTOLIN  HFA) 108 (90 Base) MCG/ACT inhaler Inhale 2 puffs into the lungs 3 (three) times daily as needed for wheezing or shortness of breath.   alendronate (FOSAMAX) 70 MG tablet Take 70 mg by mouth once a week.   aspirin  EC 81 MG tablet Take 81 mg by mouth daily. Swallow whole.   AZO-CRANBERRY PO Take 1 tablet by mouth in the morning and at bedtime.   cholecalciferol (VITAMIN D3) 25 MCG (1000 UNIT) tablet Take 1,000 Units by mouth daily.   Cyanocobalamin (VITAMIN B-12) 1000 MCG SUBL Place 1 tablet under the tongue daily.   loratadine  (CLARITIN ) 10 MG tablet Take 10 mg by mouth  daily as needed for allergies.   meloxicam (MOBIC) 15 MG tablet Take 15 mg by mouth as needed for pain.   memantine  (NAMENDA ) 10 MG tablet Take 1 tablet (10 mg total) by mouth 2 (two) times daily.   Probiotic Product (CULTURELLE PROBIOTICS) CHEW Chew 1 tablet by mouth daily.   rosuvastatin  (CRESTOR ) 10 MG tablet Take 1 tablet (10 mg total) by mouth daily.   sennosides-docusate sodium  (SENOKOT-S) 8.6-50 MG tablet Take 1 tablet by mouth daily.   No facility-administered encounter medications on file as of 01/24/2024.        No data to display            02/27/2022    9:00 AM  Montreal Cognitive Assessment   Visuospatial/ Executive (0/5) 0  Naming (0/3) 1  Attention: Read list of digits (0/2) 2  Attention: Read list of letters (0/1) 0  Attention: Serial 7 subtraction starting at 100 (0/3) 0  Language: Repeat phrase (0/2) 0  Language : Fluency (0/1) 0  Abstraction (0/2) 0  Delayed Recall (0/5) 0  Orientation (0/6) 0  Total 3  Adjusted Score (based on education) 3    Objective:     PHYSICAL EXAMINATION:    VITALS:   Vitals:   01/24/24 1546  BP: 95/80  Pulse: 67  Resp: 20  SpO2: 98%     GEN:  The patient appears stated age and is in NAD. HEENT:  Normocephalic, atraumatic.   Neurological examination:  General: NAD, well-groomed, appears stated age. Orientation: The patient is alert. Oriented to person, not to place and date Cranial nerves: There is good facial symmetry.The speech is not fluent , less clear than prior, known aphasia no dysarthria. Fund of knowledge is reduced, decreased comprehension. Recent and remote memory are impaired. Attention and concentration are reduced. Unable to name objects and repeat phrases.  Hearing is intact to conversational tone.  Sensation: Sensation is intact to light touch throughout Motor: Strength is at least antigravity x4. DTR's1/4 in UE/LE     Movement examination: Tone: There is decreasedl tone in the UE/LE Abnormal movements:  no tremor.  No myoclonus.  No asterixis.   Coordination:  There is decremation with RAM's. L apraxia UE, abnormal finger to nose  Gait and Station: she is on a wheelchair, unable to test gait or stride due to increased deconditioning  Thank you for allowing us  the opportunity to participate in the care of this nice patient. Please do not hesitate to contact us  for any questions or concerns.   Total time spent on today's visit was 26 minutes dedicated to this patient today, preparing to see patient, examining the patient, ordering tests and/or medications and counseling the patient, documenting clinical information in the EHR or other health record, independently interpreting results and communicating results to the patient/family, discussing treatment and goals, answering patient's questions and coordinating care.  Cc:  Colleen Arab, MD  Tex Filbert 01/24/2024 7:21 PM

## 2024-01-31 ENCOUNTER — Ambulatory Visit: Payer: Medicare Other | Admitting: Podiatry

## 2024-02-07 ENCOUNTER — Ambulatory Visit: Admitting: Podiatry

## 2024-02-07 ENCOUNTER — Encounter: Payer: Self-pay | Admitting: Podiatry

## 2024-02-07 DIAGNOSIS — M79674 Pain in right toe(s): Secondary | ICD-10-CM | POA: Diagnosis not present

## 2024-02-07 DIAGNOSIS — M79675 Pain in left toe(s): Secondary | ICD-10-CM | POA: Diagnosis not present

## 2024-02-07 DIAGNOSIS — B351 Tinea unguium: Secondary | ICD-10-CM

## 2024-02-07 DIAGNOSIS — F01C0 Vascular dementia, severe, without behavioral disturbance, psychotic disturbance, mood disturbance, and anxiety: Secondary | ICD-10-CM | POA: Diagnosis not present

## 2024-02-07 NOTE — Progress Notes (Signed)
 This patient returns to the office for evaluation and treatment of long thick painful nails .  This patient is unable to trim her own nails since the patient cannot reach her feet.  Patient says the nails are painful walking and wearing his shoes.  She returns for preventive foot care services.  She presents with female caregiver.  General Appearance  Alert, conversant and in no acute stress.  Vascular  Dorsalis pedis and posterior tibial  pulses are  weakly palpable  bilaterally.  Capillary return is within normal limits  bilaterally. Cold feet  bilaterally.  Neurologic  Senn-Weinstein monofilament wire test within normal limits  bilaterally. Muscle power within normal limits bilaterally.  Nails Thick disfigured discolored nails with subungual debris  from hallux to fifth toes bilaterally. No evidence of bacterial infection or drainage bilaterally.  Orthopedic  No limitations of motion  feet .  No crepitus or effusions noted.  HAV  B/L.  Hammet toes  B/L.  Skin  normotropic skin with no porokeratosis noted bilaterally.  No signs of infections or ulcers noted.     Onychomycosis  Pain in toes right foot  Pain in toes left foot  Debridement  of nails  1-5  B/L with a nail nipper.  Nails were then filed using a dremel tool with no incidents.    RTC 10 weeks    Ruffin Cotton DPM

## 2024-02-09 ENCOUNTER — Encounter: Payer: Self-pay | Admitting: Cardiology

## 2024-03-30 ENCOUNTER — Other Ambulatory Visit: Payer: Self-pay | Admitting: Adult Health

## 2024-04-17 ENCOUNTER — Ambulatory Visit (INDEPENDENT_AMBULATORY_CARE_PROVIDER_SITE_OTHER): Admitting: Podiatry

## 2024-04-17 ENCOUNTER — Encounter: Payer: Self-pay | Admitting: Podiatry

## 2024-04-17 DIAGNOSIS — F01C0 Vascular dementia, severe, without behavioral disturbance, psychotic disturbance, mood disturbance, and anxiety: Secondary | ICD-10-CM | POA: Diagnosis not present

## 2024-04-17 DIAGNOSIS — B351 Tinea unguium: Secondary | ICD-10-CM | POA: Diagnosis not present

## 2024-04-17 DIAGNOSIS — M79675 Pain in left toe(s): Secondary | ICD-10-CM | POA: Diagnosis not present

## 2024-04-17 DIAGNOSIS — M79674 Pain in right toe(s): Secondary | ICD-10-CM

## 2024-04-17 NOTE — Progress Notes (Signed)
 This patient returns to the office for evaluation and treatment of long thick painful nails .  This patient is unable to trim her own nails since the patient cannot reach her feet.  Patient says the nails are painful walking and wearing his shoes.  She returns for preventive foot care services.  She presents with female caregiver.  General Appearance  Alert, conversant and in no acute stress.  Vascular  Dorsalis pedis and posterior tibial  pulses are  weakly palpable  bilaterally.  Capillary return is within normal limits  bilaterally. Cold feet  bilaterally.  Neurologic  Senn-Weinstein monofilament wire test within normal limits  bilaterally. Muscle power within normal limits bilaterally.  Nails Thick disfigured discolored nails with subungual debris  from hallux to fifth toes bilaterally. No evidence of bacterial infection or drainage bilaterally.  Orthopedic  No limitations of motion  feet .  No crepitus or effusions noted.  HAV  B/L.  Hammet toes  B/L.  Skin  normotropic skin with no porokeratosis noted bilaterally.  No signs of infections or ulcers noted.     Onychomycosis  Pain in toes right foot  Pain in toes left foot  Debridement  of nails  1-5  B/L with a nail nipper.  Nails were then filed using a dremel tool with no incidents.    RTC 10 weeks    Ruffin Cotton DPM

## 2024-05-03 ENCOUNTER — Other Ambulatory Visit: Payer: Self-pay | Admitting: Surgery

## 2024-05-03 DIAGNOSIS — I712 Thoracic aortic aneurysm, without rupture, unspecified: Secondary | ICD-10-CM

## 2024-05-31 ENCOUNTER — Ambulatory Visit (HOSPITAL_COMMUNITY)
Admission: RE | Admit: 2024-05-31 | Discharge: 2024-05-31 | Disposition: A | Discharge status: Home / Self Care | Source: Ambulatory Visit | Attending: Cardiovascular Disease | Admitting: Cardiovascular Disease

## 2024-05-31 DIAGNOSIS — I712 Thoracic aortic aneurysm, without rupture, unspecified: Secondary | ICD-10-CM | POA: Diagnosis present

## 2024-05-31 MED ORDER — IOHEXOL 350 MG/ML SOLN
100.0000 mL | Freq: Once | INTRAVENOUS | Status: AC | PRN
  Administered 2024-05-31: 100 mL via INTRAVENOUS

## 2024-06-07 ENCOUNTER — Ambulatory Visit: Attending: Surgery | Admitting: Surgery

## 2024-06-07 ENCOUNTER — Encounter: Payer: Self-pay | Admitting: Surgery

## 2024-06-07 VITALS — BP 102/70 | HR 69 | Resp 20 | Ht 65.0 in | Wt 123.0 lb

## 2024-06-07 DIAGNOSIS — I7121 Aneurysm of the ascending aorta, without rupture: Secondary | ICD-10-CM | POA: Insufficient documentation

## 2024-06-07 NOTE — Progress Notes (Unsigned)
   [address], [place] [address]             [phone]    HPI: ***  Current Outpatient Medications  Medication Sig Dispense Refill   acetaminophen  (TYLENOL ) 500 MG tablet Take 1,000 mg by mouth as needed for mild pain or headache.     albuterol  (VENTOLIN  HFA) 108 (90 Base) MCG/ACT inhaler Inhale 2 puffs into the lungs 3 (three) times daily as needed for wheezing or shortness of breath.     alendronate (FOSAMAX) 70 MG tablet Take 70 mg by mouth once a week.     aspirin  EC 81 MG tablet Take 81 mg by mouth daily. Swallow whole.     AZO-CRANBERRY PO Take 1 tablet by mouth in the morning and at bedtime.     cholecalciferol (VITAMIN D3) 25 MCG (1000 UNIT) tablet Take 1,000 Units by mouth daily.     Cyanocobalamin (VITAMIN B-12) 1000 MCG SUBL Place 1 tablet under the tongue daily.     loratadine  (CLARITIN ) 10 MG tablet Take 10 mg by mouth daily as needed for allergies.     meloxicam (MOBIC) 15 MG tablet Take 15 mg by mouth as needed for pain.     memantine  (NAMENDA ) 10 MG tablet Take 1 tablet (10 mg total) by mouth 2 (two) times daily. 180 tablet 3   Probiotic Product (CULTURELLE PROBIOTICS) CHEW Chew 1 tablet by mouth daily. 30 tablet 0   rosuvastatin  (CRESTOR ) 10 MG tablet TAKE 1 TABLET (10 MG TOTAL) BY MOUTH DAILY. 90 tablet 1   sennosides-docusate sodium  (SENOKOT-S) 8.6-50 MG tablet Take 1 tablet by mouth daily. 30 tablet 0   No current facility-administered medications for this visit.     Physical Exam: ***  Diagnostic Tests: ***  Impression: ***  Plan: ***   Dorise MARLA Fellers, MD [facility] [phone]

## 2024-06-26 ENCOUNTER — Ambulatory Visit: Admitting: Podiatry

## 2024-06-26 ENCOUNTER — Encounter: Payer: Self-pay | Admitting: Podiatry

## 2024-06-26 DIAGNOSIS — M79675 Pain in left toe(s): Secondary | ICD-10-CM

## 2024-06-26 DIAGNOSIS — F01C0 Vascular dementia, severe, without behavioral disturbance, psychotic disturbance, mood disturbance, and anxiety: Secondary | ICD-10-CM | POA: Diagnosis not present

## 2024-06-26 DIAGNOSIS — B351 Tinea unguium: Secondary | ICD-10-CM | POA: Diagnosis not present

## 2024-06-26 DIAGNOSIS — M79674 Pain in right toe(s): Secondary | ICD-10-CM

## 2024-06-26 NOTE — Progress Notes (Signed)
 This patient returns to the office for evaluation and treatment of long thick painful nails .  This patient is unable to trim her own nails since the patient cannot reach her feet.  Patient says the nails are painful walking and wearing his shoes.  She returns for preventive foot care services.  She presents with female caregiver.  General Appearance  Alert, conversant and in no acute stress.  Vascular  Dorsalis pedis and posterior tibial  pulses are  weakly palpable  bilaterally.  Capillary return is within normal limits  bilaterally. Cold feet  bilaterally.  Neurologic  Senn-Weinstein monofilament wire test within normal limits  bilaterally. Muscle power within normal limits bilaterally.  Nails Thick disfigured discolored nails with subungual debris  from hallux to fifth toes bilaterally. No evidence of bacterial infection or drainage bilaterally.  Orthopedic  No limitations of motion  feet .  No crepitus or effusions noted.  HAV  B/L.  Hammet toes  B/L.  Skin  normotropic skin with no porokeratosis noted bilaterally.  No signs of infections or ulcers noted.     Onychomycosis  Pain in toes right foot  Pain in toes left foot  Debridement  of nails  1-5  B/L with a nail nipper.  Nails were then filed using a dremel tool with no incidents.    RTC 10 weeks    Ruffin Cotton DPM

## 2024-07-31 NOTE — Progress Notes (Signed)
 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  Cognitive, speech and physical decline***.  Has more difficulty with swallowing, regurgitating.  Swallow evaluation has been tried in the past but cannot stay upright for prolonged peers of time for accurate results.  She may require scopolamine to decrease her secretions***.  She is under the care of palliative care.  Patient is currently on***.  Patient is able to participate on ADLs***and to drive without significant difficulties.  Mood is***    Follow up in   months. Continue memantine  10 mg twice daily, side effects discussed***continue ST for swallowing and speech and PT for strength and balance*** Continue palliative care Recommend good control of her cardiovascular risk factors Continue to control mood as per PCP     Subjective:    This patient is accompanied in the office by her daughter and caregiver*** who supplements the history.  Previous records as well as any outside records available were reviewed prior to todays visit. Patient was last seen on 01/24/2024***   Any changes in memory since last visit? . repeats oneself?  Endorsed Disoriented when walking into a room? Denies ***  Leaving objects?  May misplace things but not in unusual places***  Wandering behavior?  denies   Any personality changes since last visit?  History of situational depression with moments of sadness related to aphasia and other physical and cognitive decline. Any worsening depression?:  Denies.   Hallucinations or paranoia?  Denies.   Seizures? denies    Any sleep changes?  Denies vivid dreams, REM behavior or sleepwalking   Sleep apnea?    Denies.   Any hygiene concerns?  She needs full assistance Independent of bathing and dressing?  Needs full assistance Does the patient needs help with medications?  Daughter is in charge *** Who is in charge of the finances?  Daughter is in charge   *** Any changes in appetite?  Decreased she is forgetting to eat with a spoon***   Patient have trouble swallowing?  And [place], as before***she regurgitates.  Has hiatal hernia. Does the patient cook? No Any headaches?   denies   Any vision changes?*** Chronic back pain  denies   Ambulates with difficulty?  She is with chair bound with right lower extremity weakness*** Recent falls or head injuries? Denies.     Unilateral weakness, numbness or tingling? denies   Any tremors?  Denies  *** Any anosmia?  Denies   Any incontinence of urine?  Endorsed, wears pull-up***  Any bowel dysfunction?   Denies      Patient lives with her daughter*** Does the patient drive? No longer drives ***   Initial visit 03/03/22  How long did patient have memory difficulties? 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  Then she began having difficulties with short-term memory, and for the last year, she has demonstrated difficulties with speech, knowing what I want to say, but it does not come out of my mouth .  She continues to enjoy reading, watching TV and music. Patient lives with: sister and has an aide coming for  8 hrs repeats oneself? Denies  Disoriented when walking into a room?  Patient denies except always getting confused when trying to find a bathroom  leaving objects in unusual places?  Patient denies   Ambulates  with difficulty?   Patient denies, but uses a walker for stability.   Walks a lot Recent falls?  Patient denies   Any head injuries?  She has a history of concussion of the forehead on 11/13/2021, and on 07/22/2020 after a fall after UTI  and Requiring hospitalization history of seizures?   Patient denies   Wandering behavior?  Patient denies   Patient drives?   Patient no longer drives. Stopped after the head injury  Any mood changes such irritability agitation?  Patient denies  She is sweet Any history of depression?:  Patient denies   Hallucinations?  Patient denies   Paranoia?  Patient denies   Patient reports that he sleeps well without vivid dreams, REM behavior or sleepwalking    History of sleep apnea?  Patient denies   Any hygiene concerns?  Patient denies   Independent of bathing and dressing?  Endorsed  Does the patient needs help with medications?  Caregiver in charge Who is in charge of the finances? Sister Slater is in charge  Any changes in appetite?  Patient denies   Patient have trouble swallowing? Patient denies   Does the patient cook?  Patient cooks occasionally  Any kitchen accidents such as leaving the stove on? Patient denies   Any headaches?  Patient denies   The double vision? Patient denies   Any focal numbness or tingling?  Patient denies   Chronic back pain Patient denies   Unilateral weakness?  Patient denies   Any tremors?  Patient denies   Any history of anosmia?  Patient denies   Any incontinence of urine?  Only at night may need a diaper .  She had a recent UTI and requiring hospitalization in February 2023 Any bowel dysfunction?   Patient denies   History of heavy alcohol intake?  Patient denies   History of heavy tobacco use?  Patient denies   Family history of dementia?  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   albuterol  (VENTOLIN  HFA) 108 (90 Base) MCG/ACT inhaler  Inhale 2 puffs into the lungs 3 (three) times daily as needed for wheezing or shortness of breath.   alendronate (FOSAMAX) 70 MG tablet Take 70 mg by mouth once a week.   aspirin  EC 81 MG tablet Take 81 mg by mouth daily. Swallow whole.   AZO-CRANBERRY PO Take 1 tablet by mouth in the morning and at bedtime.   cholecalciferol (VITAMIN D3) 25 MCG (1000 UNIT) tablet Take 1,000 Units by mouth daily.   Cyanocobalamin (VITAMIN B-12) 1000 MCG SUBL Place 1 tablet under the tongue daily.   loratadine  (CLARITIN ) 10 MG tablet Take 10 mg by mouth daily as needed for allergies.   meloxicam (MOBIC) 15 MG tablet Take 15 mg by mouth as needed for pain.   memantine  (NAMENDA ) 10 MG tablet Take 1 tablet (10 mg total) by mouth 2 (two) times daily.   Probiotic Product (CULTURELLE PROBIOTICS) CHEW Chew 1 tablet by mouth daily.   rosuvastatin  (CRESTOR ) 10 MG tablet  TAKE 1 TABLET (10 MG TOTAL) BY MOUTH DAILY.   sennosides-docusate sodium  (SENOKOT-S) 8.6-50 MG tablet Take 1 tablet by mouth daily.   No facility-administered encounter medications on file as of 08/01/2024.        No data to display            02/27/2022    9:00 AM  Montreal Cognitive Assessment   Visuospatial/ Executive (0/5) 0  Naming (0/3) 1  Attention: Read list of digits (0/2) 2  Attention: Read list of letters (0/1) 0  Attention: Serial 7 subtraction starting at 100 (0/3) 0  Language: Repeat phrase (0/2) 0  Language : Fluency (0/1) 0  Abstraction (0/2) 0  Delayed Recall (0/5) 0  Orientation (0/6) 0  Total 3  Adjusted Score (based on education) 3    Objective:     PHYSICAL EXAMINATION:    VITALS:  There were no vitals filed for this visit.  GEN:  The patient appears stated age and is in NAD. HEENT:  Normocephalic, atraumatic.   Neurological examination:  General: NAD, well-groomed, appears stated age. Orientation: The patient is alert. Oriented to person, not to place and date Cranial nerves: There is good facial  symmetry.The speech is not fluent and clear, known aphasia, no dysarthria. Fund of knowledge is reduced, decreased comprehension. Recent and remote memory are impaired. Attention and concentration are reduced and able to name objects and repeat phrases.  Hearing is intact to conversational tone. *** Sensation: Sensation is intact to light touch throughout Motor: Strength is at least antigravity x4. DTR's1/4 in UE/LE     Movement examination: Tone: There is normal tone in the UE/LE Abnormal movements:  no tremor.  No myoclonus.  No asterixis.   Coordination:  There is decremation with RAM's. LUE apraxia, abnormal finger to nose  Gait and Station: Patient is wheelchair-bound, unable to test gait and stride due to increased deconditioning  Thank you for allowing us  the opportunity to participate in the care of this nice patient. Please do not hesitate to contact us  for any questions or concerns.   Total time spent on today's visit was *** minutes dedicated to this patient today, preparing to see patient, examining the patient, ordering tests and/or medications and counseling the patient, documenting clinical information in the EHR or other health record, independently interpreting results and communicating results to the patient/family, discussing treatment and goals, answering patient's questions and coordinating care.  Cc:  Heusinger, Amy L, NP  Camie Sevin 07/31/2024 6:24 AM

## 2024-08-01 ENCOUNTER — Encounter: Payer: Self-pay | Admitting: Physician Assistant

## 2024-08-01 ENCOUNTER — Ambulatory Visit (INDEPENDENT_AMBULATORY_CARE_PROVIDER_SITE_OTHER): Admitting: Physician Assistant

## 2024-08-01 VITALS — BP 104/66 | HR 70 | Resp 20 | Ht 65.0 in

## 2024-08-01 DIAGNOSIS — F01B0 Vascular dementia, moderate, without behavioral disturbance, psychotic disturbance, mood disturbance, and anxiety: Secondary | ICD-10-CM | POA: Diagnosis not present

## 2024-08-01 DIAGNOSIS — G3101 Pick's disease: Secondary | ICD-10-CM | POA: Diagnosis not present

## 2024-08-01 DIAGNOSIS — F0280 Dementia in other diseases classified elsewhere without behavioral disturbance: Secondary | ICD-10-CM

## 2024-08-01 NOTE — Patient Instructions (Signed)
 It was a pleasure to see you today at our office.   Recommendations:  Continue memantine  10 mg  two times a day  Referral to PT for strength   Continue aspirin  daily  Follow up in 6 months   Whom to call:  RECOMMENDATIONS FOR ALL PATIENTS WITH MEMORY PROBLEMS: 1. Continue to exercise (Recommend 30 minutes of walking everyday, or 3 hours every week) 2. Increase social interactions - continue going to [place] and enjoy social gatherings with friends and family 3. Eat healthy, avoid fried foods and eat more fruits and vegetables 4. Maintain adequate blood pressure, blood sugar, and blood cholesterol level. Reducing the risk of stroke and cardiovascular disease also helps promoting better memory. 5. Avoid stressful situations. Live a simple life and avoid aggravations. Organize your time and prepare for the next day in anticipation. 6. Sleep well, avoid any interruptions of sleep and avoid any distractions in the bedroom that may interfere with adequate sleep quality 7. Avoid sugar, avoid sweets as there is a strong link between excessive sugar intake, diabetes, and cognitive impairment We discussed the Mediterranean diet, which has been shown to help patients reduce the risk of progressive memory disorders and reduces cardiovascular risk. This includes eating fish, eat fruits and green leafy vegetables, nuts like almonds and hazelnuts, walnuts, and also use olive oil. Avoid fast foods and fried foods as much as possible. Avoid sweets and sugar as sugar use has been linked to worsening of memory function.  There is always a concern of gradual progression of memory problems. If this is the case, then we may need to adjust level of care according to patient needs. Support, both to the patient and caregiver, should then be put into place.    FALL PRECAUTIONS: Be cautious when walking. Scan the area for obstacles that may increase the risk of trips and falls. When getting up in the mornings, sit up at  the edge of the bed for a few minutes before getting out of bed. Consider elevating the bed at the head end to avoid drop of blood pressure when getting up. Walk always in a well-lit room (use night lights in the walls). Avoid area rugs or power cords from appliances in the middle of the walkways. Use a walker or a cane if necessary and consider physical therapy for balance exercise. Get your eyesight checked regularly.  FINANCIAL OVERSIGHT: Supervision, especially oversight when making financial decisions or transactions is also recommended.  HOME SAFETY: Consider the safety of the kitchen when operating appliances like stoves, microwave oven, and blender. Consider having supervision and share cooking responsibilities until no longer able to participate in those. Accidents with firearms and other hazards in the house should be identified and addressed as well.   ABILITY TO BE LEFT ALONE: If patient is unable to contact 911 operator, consider using LifeLine, or when the need is there, arrange for someone to stay with patients. Smoking is a fire hazard, consider supervision or cessation. Risk of wandering should be assessed by caregiver and if detected at any point, supervision and safe proof recommendations should be instituted.  MEDICATION SUPERVISION: Inability to self-administer medication needs to be constantly addressed. Implement a mechanism to ensure safe administration of the medications.     Mediterranean Diet A Mediterranean diet refers to food and lifestyle choices that are based on the traditions of countries located on the [place]. This way of eating has been shown to help prevent certain conditions and improve outcomes for people  who have chronic diseases, like kidney disease and heart disease. What are tips for following this plan? Lifestyle  Cook and eat meals together with your family, when possible. Drink enough fluid to keep your urine clear or pale yellow. Be  physically active every day. This includes: Aerobic exercise like running or swimming. Leisure activities like gardening, walking, or housework. Get 7-8 hours of sleep each night. If recommended by your health care provider, drink red wine in moderation. This means 1 glass a day for nonpregnant women and 2 glasses a day for men. A glass of wine equals 5 oz (150 mL). Reading food labels  Check the serving size of packaged foods. For foods such as rice and pasta, the serving size refers to the amount of cooked product, not dry. Check the total fat in packaged foods. Avoid foods that have saturated fat or trans fats. Check the ingredients list for added sugars, such as corn syrup. Shopping  At the grocery store, buy most of your food from the areas near the walls of the store. This includes: Fresh fruits and vegetables (produce). Grains, beans, nuts, and seeds. Some of these may be available in unpackaged forms or large amounts (in bulk). Fresh seafood. Poultry and eggs. Low-fat dairy products. Buy whole ingredients instead of prepackaged foods. Buy fresh fruits and vegetables in-season from local farmers markets. Buy frozen fruits and vegetables in resealable bags. If you do not have access to quality fresh seafood, buy precooked frozen shrimp or canned fish, such as tuna, salmon, or sardines. Buy small amounts of raw or cooked vegetables, salads, or olives from the deli or salad bar at your store. Stock your pantry so you always have certain foods on hand, such as olive oil, canned tuna, canned tomatoes, rice, pasta, and beans. Cooking  Cook foods with extra-virgin olive oil instead of using butter or other vegetable oils. Have meat as a side dish, and have vegetables or grains as your main dish. This means having meat in small portions or adding small amounts of meat to foods like pasta or stew. Use beans or vegetables instead of meat in common dishes like chili or lasagna. Experiment with  different cooking methods. Try roasting or broiling vegetables instead of steaming or sauteing them. Add frozen vegetables to soups, stews, pasta, or rice. Add nuts or seeds for added healthy fat at each meal. You can add these to yogurt, salads, or vegetable dishes. Marinate fish or vegetables using olive oil, lemon juice, garlic, and fresh herbs. Meal planning  Plan to eat 1 vegetarian meal one day each week. Try to work up to 2 vegetarian meals, if possible. Eat seafood 2 or more times a week. Have healthy snacks readily available, such as: Vegetable sticks with hummus. Greek yogurt. Fruit and nut trail mix. Eat balanced meals throughout the week. This includes: Fruit: 2-3 servings a day Vegetables: 4-5 servings a day Low-fat dairy: 2 servings a day Fish, poultry, or lean meat: 1 serving a day Beans and legumes: 2 or more servings a week Nuts and seeds: 1-2 servings a day Whole grains: 6-8 servings a day Extra-virgin olive oil: 3-4 servings a day Limit red meat and sweets to only a few servings a month What are my food choices? Mediterranean diet Recommended Grains: Whole-grain pasta. Brown rice. Bulgar wheat. Polenta. Couscous. Whole-wheat bread. [place] Made[place]. Vegetables: Artichokes. Beets. Broccoli. Cabbage. Carrots. Eggplant. Green beans. Chard. Kale. Spinach. Onions. Leeks. Peas. Squash. Tomatoes. Peppers. Radishes. Fruits: Apples. Apricots. Avocado. Berries. Bananas.  Cherries. Dates. Figs. Grapes. Lemons. Melon. Oranges. Peaches. Plums. Pomegranate. Meats and other protein foods: Beans. Almonds. Sunflower seeds. Pine nuts. Peanuts. Cod. Salmon. Scallops. Shrimp. Tuna. Tilapia. Clams. Oysters. Eggs. Dairy: Low-fat milk. Cheese. Greek yogurt. Beverages: Water. Red wine. Herbal tea. Fats and oils: Extra virgin olive oil. Avocado oil. Grape seed oil. Sweets and desserts: Greek yogurt with honey. Baked apples. Poached pears. Trail mix. Seasoning and other foods: Basil.  Cilantro. Coriander. Cumin. Mint. Parsley. Sage. Rosemary. Tarragon. Garlic. Oregano. Thyme. Pepper. Balsalmic vinegar. Tahini. Hummus. Tomato sauce. Olives. Mushrooms. Limit these Grains: Prepackaged pasta or rice dishes. Prepackaged cereal with added sugar. Vegetables: Deep fried potatoes (french fries). Fruits: Fruit canned in syrup. Meats and other protein foods: Beef. Pork. Lamb. Poultry with skin. Hot dogs. [place]. Dairy: Ice cream. Sour cream. Whole milk. Beverages: Juice. Sugar-sweetened soft drinks. Beer. Liquor and spirits. Fats and oils: Butter. Canola oil. Vegetable oil. Beef fat (tallow). Lard. Sweets and desserts: Cookies. Cakes. Pies. Candy. Seasoning and other foods: Mayonnaise. Premade sauces and marinades. The items listed may not be a complete list. Talk with your dietitian about what dietary choices are right for you. Summary The Mediterranean diet includes both food and lifestyle choices. Eat a variety of fresh fruits and vegetables, beans, nuts, seeds, and whole grains. Limit the amount of red meat and sweets that you eat. Talk with your health care provider about whether it is safe for you to drink red wine in moderation. This means 1 glass a day for nonpregnant women and 2 glasses a day for men. A glass of wine equals 5 oz (150 mL). This information is not intended to replace advice given to you by your health care provider. Make sure you discuss any questions you have with your health care provider. Document Released: 04/30/2016 Document Revised: 06/02/2016 Document Reviewed: 04/30/2016 Elsevier Interactive Patient Education  2017 [employer].   We have sent a referral to [facility] for your MRI and they will call you directly to schedule your appointment. They are located at [address] [facility]. If you need to contact them directly please call [phone].

## 2024-09-05 ENCOUNTER — Ambulatory Visit: Admitting: Podiatry

## 2025-01-29 ENCOUNTER — Ambulatory Visit: Admitting: Physician Assistant
